# Patient Record
Sex: Female | Born: 1945 | Race: Black or African American | Hispanic: No | Smoking: Former smoker
Health system: Southern US, Community
[De-identification: ages and names within clinical notes are randomized; demographics above are authoritative.]

## PROBLEM LIST (undated history)

## (undated) DIAGNOSIS — K5792 Diverticulitis of intestine, part unspecified, without perforation or abscess without bleeding: Secondary | ICD-10-CM

## (undated) DIAGNOSIS — E119 Type 2 diabetes mellitus without complications: Secondary | ICD-10-CM

## (undated) DIAGNOSIS — R296 Repeated falls: Secondary | ICD-10-CM

## (undated) DIAGNOSIS — I10 Essential (primary) hypertension: Secondary | ICD-10-CM

## (undated) DIAGNOSIS — I509 Heart failure, unspecified: Secondary | ICD-10-CM

## (undated) DIAGNOSIS — I499 Cardiac arrhythmia, unspecified: Secondary | ICD-10-CM

## (undated) HISTORY — PX: JOINT REPLACEMENT: SHX530

## (undated) HISTORY — PX: ABDOMINAL HYSTERECTOMY: SHX81

## (undated) HISTORY — DX: Cardiac arrhythmia, unspecified: I49.9

## (undated) SURGERY — LEFT HEART CATH AND CORONARY ANGIOGRAPHY
Anesthesia: Moderate Sedation | Laterality: Right

---

## 2004-09-04 ENCOUNTER — Ambulatory Visit: Payer: Self-pay | Admitting: Internal Medicine

## 2005-09-30 ENCOUNTER — Other Ambulatory Visit: Payer: Self-pay

## 2005-09-30 ENCOUNTER — Emergency Department: Payer: Self-pay | Admitting: Unknown Physician Specialty

## 2007-01-14 ENCOUNTER — Ambulatory Visit: Payer: Self-pay | Admitting: Internal Medicine

## 2008-09-07 ENCOUNTER — Emergency Department: Payer: Self-pay | Admitting: Emergency Medicine

## 2008-09-08 ENCOUNTER — Ambulatory Visit: Payer: Self-pay | Admitting: General Practice

## 2008-11-02 ENCOUNTER — Ambulatory Visit: Payer: Self-pay

## 2009-04-29 ENCOUNTER — Ambulatory Visit: Payer: Self-pay | Admitting: Internal Medicine

## 2010-01-21 ENCOUNTER — Emergency Department: Payer: Self-pay | Admitting: Unknown Physician Specialty

## 2010-03-29 ENCOUNTER — Ambulatory Visit: Payer: Self-pay

## 2011-01-16 ENCOUNTER — Ambulatory Visit: Payer: Self-pay | Admitting: Surgery

## 2011-02-01 ENCOUNTER — Ambulatory Visit: Payer: Self-pay | Admitting: Surgery

## 2011-02-09 ENCOUNTER — Encounter: Payer: Self-pay | Admitting: Rheumatology

## 2011-02-13 ENCOUNTER — Ambulatory Visit: Payer: Self-pay | Admitting: Rheumatology

## 2011-02-18 ENCOUNTER — Emergency Department: Payer: Self-pay | Admitting: Internal Medicine

## 2011-03-04 ENCOUNTER — Ambulatory Visit: Payer: Self-pay | Admitting: Surgery

## 2011-03-28 ENCOUNTER — Encounter: Payer: Self-pay | Admitting: Orthopedic Surgery

## 2011-04-02 ENCOUNTER — Ambulatory Visit: Payer: Self-pay | Admitting: Internal Medicine

## 2011-04-03 ENCOUNTER — Encounter: Payer: Self-pay | Admitting: Orthopedic Surgery

## 2011-05-04 ENCOUNTER — Encounter: Payer: Self-pay | Admitting: Orthopedic Surgery

## 2011-05-07 ENCOUNTER — Ambulatory Visit: Payer: Self-pay | Admitting: Surgery

## 2011-05-10 ENCOUNTER — Inpatient Hospital Stay: Payer: Self-pay | Admitting: Internal Medicine

## 2011-06-03 ENCOUNTER — Ambulatory Visit: Payer: Self-pay | Admitting: Surgery

## 2012-04-02 ENCOUNTER — Ambulatory Visit: Payer: Self-pay | Admitting: Internal Medicine

## 2012-04-21 ENCOUNTER — Ambulatory Visit: Payer: Self-pay | Admitting: Orthopedic Surgery

## 2012-04-21 LAB — URINALYSIS, COMPLETE
Blood: NEGATIVE
Leukocyte Esterase: NEGATIVE
Nitrite: NEGATIVE
Ph: 5 (ref 4.5–8.0)
RBC,UR: 1 /HPF (ref 0–5)
Specific Gravity: 1.017 (ref 1.003–1.030)
WBC UR: 1 /HPF (ref 0–5)

## 2012-04-21 LAB — BASIC METABOLIC PANEL
Anion Gap: 6 — ABNORMAL LOW (ref 7–16)
BUN: 14 mg/dL (ref 7–18)
Calcium, Total: 8.9 mg/dL (ref 8.5–10.1)
Chloride: 106 mmol/L (ref 98–107)
Co2: 29 mmol/L (ref 21–32)
Creatinine: 1.09 mg/dL (ref 0.60–1.30)
EGFR (African American): >60
EGFR (Non-African Amer.): 53 — ABNORMAL LOW
Glucose: 155 mg/dL — ABNORMAL HIGH (ref 65–99)
Osmolality: 285 (ref 275–301)
Potassium: 4.1 mmol/L (ref 3.5–5.1)

## 2012-04-21 LAB — CBC
HCT: 39.2 % (ref 35.0–47.0)
HGB: 12.5 g/dL (ref 12.0–16.0)
MCH: 28.6 pg (ref 26.0–34.0)
Platelet: 254 10*3/uL (ref 150–440)

## 2012-04-21 LAB — APTT: Activated PTT: 25.7 secs (ref 23.6–35.9)

## 2012-04-21 LAB — PROTIME-INR: Prothrombin Time: 11.9 secs (ref 11.5–14.7)

## 2012-04-21 LAB — SEDIMENTATION RATE: Erythrocyte Sed Rate: 5 mm/hr (ref 0–30)

## 2012-04-21 LAB — MRSA PCR SCREENING

## 2012-04-30 ENCOUNTER — Inpatient Hospital Stay: Payer: Self-pay | Admitting: Orthopedic Surgery

## 2012-05-01 LAB — CBC WITH DIFFERENTIAL/PLATELET
Basophil #: 0 10*3/uL (ref 0.0–0.1)
Basophil %: 0.2 %
Eosinophil #: 0 10*3/uL (ref 0.0–0.7)
HCT: 37 % (ref 35.0–47.0)
HGB: 11.7 g/dL — ABNORMAL LOW (ref 12.0–16.0)
Lymphocyte #: 0.8 10*3/uL — ABNORMAL LOW (ref 1.0–3.6)
MCH: 27.9 pg (ref 26.0–34.0)
MCHC: 31.6 g/dL — ABNORMAL LOW (ref 32.0–36.0)
MCV: 88 fL (ref 80–100)
Monocyte #: 1.8 x10 3/mm — ABNORMAL HIGH (ref 0.2–0.9)
Neutrophil #: 9.3 10*3/uL — ABNORMAL HIGH (ref 1.4–6.5)
Platelet: 203 10*3/uL (ref 150–440)

## 2012-05-01 LAB — BASIC METABOLIC PANEL
Anion Gap: 8 (ref 7–16)
Calcium, Total: 8.5 mg/dL (ref 8.5–10.1)
Co2: 25 mmol/L (ref 21–32)
Osmolality: 278 (ref 275–301)
Sodium: 138 mmol/L (ref 136–145)

## 2012-05-02 LAB — CBC WITH DIFFERENTIAL/PLATELET
Basophil #: 0 10*3/uL (ref 0.0–0.1)
Eosinophil #: 0 10*3/uL (ref 0.0–0.7)
HCT: 35.5 % (ref 35.0–47.0)
HGB: 11.6 g/dL — ABNORMAL LOW (ref 12.0–16.0)
MCH: 28.5 pg (ref 26.0–34.0)
MCHC: 32.6 g/dL (ref 32.0–36.0)
Neutrophil #: 8.7 10*3/uL — ABNORMAL HIGH (ref 1.4–6.5)
Platelet: 194 10*3/uL (ref 150–440)
RBC: 4.05 10*6/uL (ref 3.80–5.20)

## 2012-05-03 LAB — CBC WITH DIFFERENTIAL/PLATELET
Basophil %: 0.3 %
HCT: 33.4 % — ABNORMAL LOW (ref 35.0–47.0)
HGB: 11 g/dL — ABNORMAL LOW (ref 12.0–16.0)
MCHC: 32.9 g/dL (ref 32.0–36.0)
MCV: 87 fL (ref 80–100)
Monocyte %: 10.4 %
Neutrophil #: 7.3 10*3/uL — ABNORMAL HIGH (ref 1.4–6.5)
Neutrophil %: 80 %
RBC: 3.83 10*6/uL (ref 3.80–5.20)
RDW: 13.9 % (ref 11.5–14.5)
WBC: 9.1 10*3/uL (ref 3.6–11.0)

## 2012-05-05 LAB — CREATININE, SERUM: Creatinine: 1.03 mg/dL (ref 0.60–1.30)

## 2012-05-17 ENCOUNTER — Emergency Department: Payer: Self-pay | Admitting: Emergency Medicine

## 2012-05-17 LAB — COMPREHENSIVE METABOLIC PANEL
Albumin: 3 g/dL — ABNORMAL LOW (ref 3.4–5.0)
Anion Gap: 7 (ref 7–16)
BUN: 15 mg/dL (ref 7–18)
Bilirubin,Total: 0.2 mg/dL (ref 0.2–1.0)
Calcium, Total: 9.6 mg/dL (ref 8.5–10.1)
EGFR (African American): >60
EGFR (Non-African Amer.): >60
Glucose: 115 mg/dL — ABNORMAL HIGH (ref 65–99)
Sodium: 139 mmol/L (ref 136–145)

## 2012-05-17 LAB — CBC
HCT: 37.2 % (ref 35.0–47.0)
MCHC: 31.1 g/dL — ABNORMAL LOW (ref 32.0–36.0)
Platelet: 526 10*3/uL — ABNORMAL HIGH (ref 150–440)
WBC: 8 10*3/uL (ref 3.6–11.0)

## 2012-05-17 LAB — APTT: Activated PTT: 30.7 secs (ref 23.6–35.9)

## 2012-05-18 ENCOUNTER — Emergency Department: Payer: Self-pay | Admitting: Emergency Medicine

## 2012-05-23 LAB — CULTURE, BLOOD (SINGLE)

## 2012-05-27 ENCOUNTER — Emergency Department: Payer: Self-pay | Admitting: Emergency Medicine

## 2012-05-27 LAB — CBC
HCT: 39.2 % (ref 35.0–47.0)
MCH: 27.7 pg (ref 26.0–34.0)
MCHC: 31.9 g/dL — ABNORMAL LOW (ref 32.0–36.0)
RBC: 4.51 10*6/uL (ref 3.80–5.20)
RDW: 14.2 % (ref 11.5–14.5)

## 2012-05-27 LAB — BASIC METABOLIC PANEL
Anion Gap: 10 (ref 7–16)
Calcium, Total: 9.4 mg/dL (ref 8.5–10.1)
Chloride: 103 mmol/L (ref 98–107)
Creatinine: 1.33 mg/dL — ABNORMAL HIGH (ref 0.60–1.30)
Glucose: 159 mg/dL — ABNORMAL HIGH (ref 65–99)
Osmolality: 277 (ref 275–301)
Potassium: 4.5 mmol/L (ref 3.5–5.1)

## 2012-05-31 ENCOUNTER — Observation Stay: Payer: Self-pay | Admitting: Internal Medicine

## 2012-05-31 LAB — BASIC METABOLIC PANEL
Anion Gap: 10 (ref 7–16)
Co2: 22 mmol/L (ref 21–32)
Creatinine: 1.04 mg/dL (ref 0.60–1.30)
EGFR (African American): >60
Osmolality: 283 (ref 275–301)
Sodium: 140 mmol/L (ref 136–145)

## 2012-05-31 LAB — CBC
HGB: 11.7 g/dL — ABNORMAL LOW (ref 12.0–16.0)
MCH: 27.3 pg (ref 26.0–34.0)
MCHC: 31.3 g/dL — ABNORMAL LOW (ref 32.0–36.0)
MCV: 87 fL (ref 80–100)
RBC: 4.3 10*6/uL (ref 3.80–5.20)
RDW: 14.1 % (ref 11.5–14.5)

## 2012-09-23 ENCOUNTER — Ambulatory Visit: Payer: Self-pay | Admitting: Orthopedic Surgery

## 2012-09-23 LAB — CBC
HGB: 13.9 g/dL (ref 12.0–16.0)
MCH: 27.4 pg (ref 26.0–34.0)
MCHC: 32.3 g/dL (ref 32.0–36.0)
MCV: 85 fL (ref 80–100)
Platelet: 306 10*3/uL (ref 150–440)
RBC: 5.06 10*6/uL (ref 3.80–5.20)
RDW: 15.1 % — ABNORMAL HIGH (ref 11.5–14.5)

## 2012-09-23 LAB — URINALYSIS, COMPLETE
Blood: NEGATIVE
Glucose,UR: NEGATIVE mg/dL (ref 0–75)
Leukocyte Esterase: NEGATIVE
Nitrite: NEGATIVE
Protein: NEGATIVE
RBC,UR: NONE SEEN /HPF (ref 0–5)
Specific Gravity: 1.003 (ref 1.003–1.030)
Squamous Epithelial: 1
WBC UR: 3 /HPF (ref 0–5)

## 2012-09-23 LAB — BASIC METABOLIC PANEL
Anion Gap: 8 (ref 7–16)
Calcium, Total: 10 mg/dL (ref 8.5–10.1)
Chloride: 101 mmol/L (ref 98–107)
Co2: 33 mmol/L — ABNORMAL HIGH (ref 21–32)
Creatinine: 0.87 mg/dL (ref 0.60–1.30)
EGFR (Non-African Amer.): >60
Glucose: 83 mg/dL (ref 65–99)
Osmolality: 283 (ref 275–301)
Sodium: 142 mmol/L (ref 136–145)

## 2012-09-23 LAB — MRSA PCR SCREENING

## 2012-09-23 LAB — PROTIME-INR
INR: 0.8
Prothrombin Time: 11.8 secs (ref 11.5–14.7)

## 2012-09-30 ENCOUNTER — Inpatient Hospital Stay: Payer: Self-pay | Admitting: Orthopedic Surgery

## 2012-10-01 LAB — CBC WITH DIFFERENTIAL/PLATELET
Basophil #: 0 10*3/uL (ref 0.0–0.1)
Basophil %: 0.3 %
Eosinophil %: 0.1 %
HCT: 32.6 % — ABNORMAL LOW (ref 35.0–47.0)
HGB: 10.6 g/dL — ABNORMAL LOW (ref 12.0–16.0)
Lymphocyte #: 1.6 10*3/uL (ref 1.0–3.6)
Lymphocyte %: 13.3 %
MCV: 85 fL (ref 80–100)
Monocyte %: 13.4 %
Neutrophil #: 8.6 10*3/uL — ABNORMAL HIGH (ref 1.4–6.5)
RBC: 3.84 10*6/uL (ref 3.80–5.20)
RDW: 14.3 % (ref 11.5–14.5)
WBC: 11.8 10*3/uL — ABNORMAL HIGH (ref 3.6–11.0)

## 2012-10-01 LAB — BASIC METABOLIC PANEL
Anion Gap: 9 (ref 7–16)
BUN: 15 mg/dL (ref 7–18)
Creatinine: 0.99 mg/dL (ref 0.60–1.30)
EGFR (African American): >60
Glucose: 174 mg/dL — ABNORMAL HIGH (ref 65–99)
Potassium: 3.9 mmol/L (ref 3.5–5.1)
Sodium: 141 mmol/L (ref 136–145)

## 2012-10-02 LAB — CBC WITH DIFFERENTIAL/PLATELET
Basophil #: 0 10*3/uL (ref 0.0–0.1)
Basophil %: 0.3 %
Eosinophil %: 0.2 %
HCT: 30.2 % — ABNORMAL LOW (ref 35.0–47.0)
HGB: 10.1 g/dL — ABNORMAL LOW (ref 12.0–16.0)
Lymphocyte #: 0.9 10*3/uL — ABNORMAL LOW (ref 1.0–3.6)
Lymphocyte %: 8.8 %
MCH: 28.1 pg (ref 26.0–34.0)
Monocyte #: 1.3 x10 3/mm — ABNORMAL HIGH (ref 0.2–0.9)
Monocyte %: 12.9 %
Neutrophil %: 77.8 %
Platelet: 196 10*3/uL (ref 150–440)
RBC: 3.61 10*6/uL — ABNORMAL LOW (ref 3.80–5.20)
WBC: 10 10*3/uL (ref 3.6–11.0)

## 2012-10-03 LAB — CBC WITH DIFFERENTIAL/PLATELET
Basophil #: 0 10*3/uL (ref 0.0–0.1)
Basophil %: 0.4 %
Eosinophil #: 0.1 10*3/uL (ref 0.0–0.7)
HCT: 28.5 % — ABNORMAL LOW (ref 35.0–47.0)
HGB: 9.4 g/dL — ABNORMAL LOW (ref 12.0–16.0)
Lymphocyte %: 15.5 %
MCV: 85 fL (ref 80–100)
Monocyte #: 1.1 x10 3/mm — ABNORMAL HIGH (ref 0.2–0.9)
Neutrophil #: 5.9 10*3/uL (ref 1.4–6.5)
RBC: 3.34 10*6/uL — ABNORMAL LOW (ref 3.80–5.20)
WBC: 8.6 10*3/uL (ref 3.6–11.0)

## 2012-10-03 LAB — BASIC METABOLIC PANEL
Calcium, Total: 8.8 mg/dL (ref 8.5–10.1)
Chloride: 102 mmol/L (ref 98–107)
Creatinine: 0.85 mg/dL (ref 0.60–1.30)
EGFR (African American): >60
EGFR (Non-African Amer.): >60
Glucose: 130 mg/dL — ABNORMAL HIGH (ref 65–99)
Osmolality: 282 (ref 275–301)
Potassium: 4.1 mmol/L (ref 3.5–5.1)
Sodium: 140 mmol/L (ref 136–145)

## 2013-04-06 ENCOUNTER — Ambulatory Visit: Payer: Self-pay | Admitting: Internal Medicine

## 2013-04-18 ENCOUNTER — Inpatient Hospital Stay: Payer: Self-pay | Admitting: Internal Medicine

## 2013-04-18 LAB — CK TOTAL AND CKMB (NOT AT ARMC)
CK, Total: 112 U/L (ref 21–215)
CK, Total: 93 U/L (ref 21–215)
CK, Total: 81 U/L (ref 21–215)
CK-MB: 0.8 ng/mL (ref 0.5–3.6)
CK-MB: 0.7 ng/mL (ref 0.5–3.6)

## 2013-04-18 LAB — BASIC METABOLIC PANEL
Anion Gap: 7 (ref 7–16)
BUN: 15 mg/dL (ref 7–18)
Co2: 28 mmol/L (ref 21–32)
Creatinine: 0.91 mg/dL (ref 0.60–1.30)
EGFR (African American): >60
EGFR (Non-African Amer.): >60
Glucose: 99 mg/dL (ref 65–99)
Osmolality: 288 (ref 275–301)

## 2013-04-18 LAB — CBC
HCT: 35.7 % (ref 35.0–47.0)
MCH: 26.5 pg (ref 26.0–34.0)
MCHC: 32.2 g/dL (ref 32.0–36.0)
MCV: 82 fL (ref 80–100)
Platelet: 247 10*3/uL (ref 150–440)
RBC: 4.34 10*6/uL (ref 3.80–5.20)
RDW: 15.3 % — ABNORMAL HIGH (ref 11.5–14.5)
WBC: 7.8 10*3/uL (ref 3.6–11.0)

## 2013-04-18 LAB — TROPONIN I
Troponin-I: 0.09 ng/mL — ABNORMAL HIGH
Troponin-I: 0.09 ng/mL — ABNORMAL HIGH
Troponin-I: 0.07 ng/mL — ABNORMAL HIGH
Troponin-I: 0.05 ng/mL

## 2013-04-19 LAB — CBC WITH DIFFERENTIAL/PLATELET
Basophil #: 0 10*3/uL (ref 0.0–0.1)
Basophil %: 0.7 %
Eosinophil #: 0.1 10*3/uL (ref 0.0–0.7)
Eosinophil %: 1 %
HCT: 35.8 % (ref 35.0–47.0)
HGB: 11.8 g/dL — ABNORMAL LOW (ref 12.0–16.0)
Lymphocyte #: 1.3 10*3/uL (ref 1.0–3.6)
MCHC: 33.1 g/dL (ref 32.0–36.0)
Monocyte #: 0.6 x10 3/mm (ref 0.2–0.9)
Neutrophil #: 3.3 10*3/uL (ref 1.4–6.5)
Neutrophil %: 61.5 %
RBC: 4.45 10*6/uL (ref 3.80–5.20)
RDW: 15 % — ABNORMAL HIGH (ref 11.5–14.5)

## 2013-04-19 LAB — BASIC METABOLIC PANEL
Anion Gap: 9 (ref 7–16)
BUN: 17 mg/dL (ref 7–18)
Calcium, Total: 9.6 mg/dL (ref 8.5–10.1)
Chloride: 99 mmol/L (ref 98–107)
Creatinine: 0.99 mg/dL (ref 0.60–1.30)
EGFR (Non-African Amer.): 59 — ABNORMAL LOW
Glucose: 120 mg/dL — ABNORMAL HIGH (ref 65–99)
Osmolality: 277 (ref 275–301)
Potassium: 3.4 mmol/L — ABNORMAL LOW (ref 3.5–5.1)
Sodium: 137 mmol/L (ref 136–145)

## 2013-04-19 LAB — TSH: Thyroid Stimulating Horm: 1.15 u[IU]/mL

## 2013-04-19 LAB — LIPID PANEL
Triglycerides: 69 mg/dL (ref 0–200)
VLDL Cholesterol, Calc: 14 mg/dL (ref 5–40)

## 2013-04-19 LAB — HEMOGLOBIN A1C: Hemoglobin A1C: 6.7 % — ABNORMAL HIGH (ref 4.2–6.3)

## 2013-04-20 LAB — BASIC METABOLIC PANEL
Anion Gap: 5 — ABNORMAL LOW (ref 7–16)
BUN: 21 mg/dL — ABNORMAL HIGH (ref 7–18)
Calcium, Total: 9.8 mg/dL (ref 8.5–10.1)
Chloride: 99 mmol/L (ref 98–107)
Co2: 32 mmol/L (ref 21–32)
Creatinine: 0.98 mg/dL (ref 0.60–1.30)
Glucose: 120 mg/dL — ABNORMAL HIGH (ref 65–99)
Osmolality: 276 (ref 275–301)
Potassium: 3.4 mmol/L — ABNORMAL LOW (ref 3.5–5.1)
Sodium: 136 mmol/L (ref 136–145)

## 2013-04-20 LAB — MAGNESIUM: Magnesium: 2 mg/dL

## 2013-04-21 LAB — BASIC METABOLIC PANEL
BUN: 21 mg/dL — ABNORMAL HIGH (ref 7–18)
Chloride: 97 mmol/L — ABNORMAL LOW (ref 98–107)
Co2: 32 mmol/L (ref 21–32)
Creatinine: 0.97 mg/dL (ref 0.60–1.30)
EGFR (African American): >60
EGFR (Non-African Amer.): >60
Glucose: 125 mg/dL — ABNORMAL HIGH (ref 65–99)

## 2013-04-22 LAB — BASIC METABOLIC PANEL
Anion Gap: 5 — ABNORMAL LOW (ref 7–16)
Calcium, Total: 9.3 mg/dL (ref 8.5–10.1)
Chloride: 102 mmol/L (ref 98–107)
Co2: 31 mmol/L (ref 21–32)
EGFR (African American): >60
Glucose: 118 mg/dL — ABNORMAL HIGH (ref 65–99)
Sodium: 138 mmol/L (ref 136–145)

## 2013-05-05 ENCOUNTER — Emergency Department: Payer: Self-pay | Admitting: Emergency Medicine

## 2013-05-05 LAB — CBC
HGB: 11.2 g/dL — ABNORMAL LOW (ref 12.0–16.0)
MCHC: 32.6 g/dL (ref 32.0–36.0)
MCV: 81 fL (ref 80–100)
RDW: 14.4 % (ref 11.5–14.5)
WBC: 5.1 10*3/uL (ref 3.6–11.0)

## 2013-05-05 LAB — BASIC METABOLIC PANEL
Anion Gap: 8 (ref 7–16)
BUN: 16 mg/dL (ref 7–18)
Calcium, Total: 8.9 mg/dL (ref 8.5–10.1)
Co2: 24 mmol/L (ref 21–32)
EGFR (African American): >60
EGFR (Non-African Amer.): >60
Glucose: 105 mg/dL — ABNORMAL HIGH (ref 65–99)
Osmolality: 281 (ref 275–301)
Potassium: 3.6 mmol/L (ref 3.5–5.1)
Sodium: 140 mmol/L (ref 136–145)

## 2013-05-05 LAB — CK TOTAL AND CKMB (NOT AT ARMC)
CK, Total: 59 U/L (ref 21–215)
CK-MB: <0.5 ng/mL — ABNORMAL LOW (ref 0.5–3.6)

## 2013-05-05 LAB — PRO B NATRIURETIC PEPTIDE: B-Type Natriuretic Peptide: 207 pg/mL — ABNORMAL HIGH (ref 0–125)

## 2013-05-05 LAB — TROPONIN I: Troponin-I: 0.06 ng/mL — ABNORMAL HIGH

## 2013-05-11 ENCOUNTER — Emergency Department: Payer: Self-pay | Admitting: Emergency Medicine

## 2013-05-11 LAB — CBC
HCT: 36.5 % (ref 35.0–47.0)
MCHC: 32.3 g/dL (ref 32.0–36.0)
Platelet: 258 10*3/uL (ref 150–440)
RDW: 14.9 % — ABNORMAL HIGH (ref 11.5–14.5)
WBC: 6.7 10*3/uL (ref 3.6–11.0)

## 2013-05-11 LAB — COMPREHENSIVE METABOLIC PANEL
Albumin: 3.5 g/dL (ref 3.4–5.0)
Alkaline Phosphatase: 69 U/L (ref 50–136)
BUN: 14 mg/dL (ref 7–18)
Bilirubin,Total: 0.3 mg/dL (ref 0.2–1.0)
Calcium, Total: 8.9 mg/dL (ref 8.5–10.1)
Chloride: 110 mmol/L — ABNORMAL HIGH (ref 98–107)
Creatinine: 0.98 mg/dL (ref 0.60–1.30)
EGFR (African American): >60
EGFR (Non-African Amer.): 60 — ABNORMAL LOW
SGOT(AST): 16 U/L (ref 15–37)
SGPT (ALT): 16 U/L (ref 12–78)
Sodium: 143 mmol/L (ref 136–145)
Total Protein: 6.8 g/dL (ref 6.4–8.2)

## 2013-05-11 LAB — URINALYSIS, COMPLETE
Bilirubin,UR: NEGATIVE
Blood: NEGATIVE
Glucose,UR: NEGATIVE mg/dL (ref 0–75)
Ketone: NEGATIVE
Nitrite: NEGATIVE
Ph: 6 (ref 4.5–8.0)
Protein: NEGATIVE
RBC,UR: 1 /HPF (ref 0–5)
Squamous Epithelial: 9
WBC UR: 4 /HPF (ref 0–5)

## 2013-05-11 LAB — TROPONIN I
Troponin-I: 0.09 ng/mL — ABNORMAL HIGH
Troponin-I: 0.08 ng/mL — ABNORMAL HIGH

## 2013-11-27 ENCOUNTER — Ambulatory Visit: Payer: Self-pay | Admitting: Internal Medicine

## 2014-04-28 ENCOUNTER — Ambulatory Visit: Payer: Self-pay | Admitting: Internal Medicine

## 2015-01-21 ENCOUNTER — Ambulatory Visit: Payer: Self-pay | Admitting: Gastroenterology

## 2015-03-22 NOTE — Discharge Summary (Signed)
PATIENT NAME:  Virginia, Barnes MR#:  [id number] DATE OF BIRTH:  02/12/1946  DATE OF ADMISSION:  09/30/2012 DATE OF DISCHARGE:  10/03/2012  ADMITTING DIAGNOSIS: Status post right total knee arthroplasty.   DISCHARGE DIAGNOSIS: Status post right total knee arthroplasty.  HISTORY: Virginia Barnes is a 69 year old female who has had long-standing right knee pain with tricompartmental degenerative changes seen on x-ray. Virginia Barnes successfully underwent a left total knee arthroplasty earlier this year. Virginia Barnes is here for total knee arthroplasty on the right side.   PAST MEDICAL/SURGICAL HISTORY:  1. Type 2 diabetes.  2. Depression.  3. Hypertension.  4. Gastroesophageal reflux disease.  5. Status post total hysterectomy.  6. Status post left total knee arthroplasty. 7. Status post open reduction and internal fixation right wrist.   ALLERGIES: Lisinopril, ACE inhibitors and peanuts.   HOME MEDICATIONS:  1. Tribenzor 40 mg/5 mg/25 mg p.o. daily. 2. Ranitidine 150 mg b.i.d.  3. Omeprazole 40 mg daily. 4. Metoprolol 50 mg b.i.d.  5. Metformin 500 mg b.i.d. 6. Crestor 10 mg daily. 7. Aspirin 81 mg daily.   HOSPITAL COURSE: The patient was admitted following a successful, uncomplicated right total knee arthroplasty on 09/30/2012. Postoperatively, the patient was admitted to the Orthopedic floor. Virginia Barnes was kept on postoperative antibiotics for 24 hours. A Foley catheter was in place and was removed on postoperative day two. Physical and Occupational Therapy consults were ordered for postoperative day one, and they continued to follow her throughout her hospital stay. The patient had daily labs drawn, including a basic metabolic profile and a complete blood count. Her hemoglobin and hematocrit remained stable throughout her hospitalization. The patient's Hemovac drain was removed after 24 hours as well. The patient's hemoglobin and hematocrit were stable, and Virginia Barnes never required any blood transfusion during her  hospitalization. On postoperative day two, the patient did have an episode of orthostatic hypotension when Virginia Barnes was up with Physical Therapy. These symptoms resolved in short order, and Virginia Barnes had no further symptoms. The patient's vital signs remained stable from the remainder of her hospitalization. By postoperative day three, Virginia Barnes had had a bowel movement. Her pain was well controlled on  Norco, and the patient has shown excellent clinical improvement and is ready for discharge.   DISCHARGE INSTRUCTIONS:  1. The patient will be discharged to skilled nursing.  2. Virginia Barnes will keep her right lower extremity elevated on pillows when in bed.  3. Virginia Barnes should continue wearing knee-high TED hose on both legs to help reduce edema.  4. Virginia Barnes is to have daily dressing changes until the incision is completely dry.  5. The patient has diabetes and should be on a diabetic diet and should have fingerstick checks with meals.  6. Virginia Barnes should be on an insulin sliding scale while at rehab. Virginia Barnes was on a sliding scale while  an inpatient here.  7. The patient should have Lovenox 40 mg subcutaneous daily for deep vein thrombosis prophylaxis.  8. Her staples will be removed in approximately 10 days in the surgeon's office.  9. Virginia Barnes should continue physical and occupational therapy.  10. Virginia Barnes will be partial weight-bearing on the right lower extremity. Virginia Barnes may bear full weight on the left side.  11. The patient will continue using her Polar Care unit.  12. The patient will be on Vicodin 5/325 mg, 1 to 2 tabs every 4 hours p.r.n. for pain. Virginia Barnes may restart her home medications.  13. Virginia Barnes will follow up with me in two weeks.  ____________________________  Kathreen DevoidKevin L. Jenevie Casstevens, MD klk:cbb D: 10/03/2012 15:27:44 ET T: 10/03/2012 15:50:33 ET JOB#: [id number]  cc: Kathreen DevoidKevin L. Norlan Rann, MD, <Dictator> Kathreen DevoidKEVIN L Frida Wahlstrom MD ELECTRONICALLY SIGNED 10/08/2012 8:47

## 2015-03-22 NOTE — Op Note (Signed)
PATIENT NAME:  [place]ia Barnes, Virginia Barnes MR#:  [id number] DATE OF BIRTH:  1946/06/19  DATE OF PROCEDURE:  09/30/2012  PREOPERATIVE DIAGNOSIS: Right knee osteoarthritis.   POSTOPERATIVE DIAGNOSIS: Right knee osteoarthritis.  PROCEDURE: Right total knee arthroplasty.   SURGEON: Juanell Fairly, MD  ASSISTANT: Deeann Saint, MD  ANESTHESIA: Spinal.   TOURNIQUET TIME: 120 minutes.   COMPLICATIONS: None.   SPECIMENS: None.   IMPLANTS:  DePuy PFC Sigma Size 3 posterior stabilized femoral component, Size 3 rotating platform tibial component, 10mm polyethylene insert, 35mm polyethylene patella component  INDICATIONS FOR PROCEDURE: The patient is a 69 year old female with persistent right knee pain and swelling. The patient had successfully undergone a left total knee arthroplasty earlier this year. The patient wished to proceed with a right total knee arthroplasty given the success she has had on the left side.   The patient was aware of the risks and benefits of surgery given the fact that she had been through it earlier this year on the left side. I had seen her in the office prior to her surgery to again review the risks and benefits of surgery. She understands the risks include infection, bleeding requiring blood transfusion, nerve or blood vessel injury leading to permanent numbness or weakness in the right lower extremity, knee stiffness, fracture, dislocation, persistent pain or instability of the right knee, failure or loosening of the components, and the need for further surgery. She understands the medical risks include, but are not limited to, deep vein thrombosis and pulmonary embolism, myocardial infarction, stroke, pneumonia, respiratory failure, and death. The patient signed the consent form in my office.   PROCEDURE NOTE: The patient was brought to the Operating Room where she underwent a spinal anesthetic. She was then prepped and draped in a sterile fashion. A timeout was performed to verify  the patient's name, date of birth, medical record number, correct site of surgery, and correct procedure to be performed. It was also used to verify the patient had received antibiotics and that all appropriate instruments, implants, and radiographic studies were available in the room. Once all in attendance were in agreement, the case began.   A proposed incision was drawn out based on bony landmarks. A #10 blade was used to make a longitudinal incision centered over the anterior aspect of the right knee. Full-thickness flaps were then developed using a deep #10 blade. A medial arthrotomy was then performed, and then the right patella was everted. The knee was flexed to approximately 100 degrees. Hoffa's fat pad, along with the medial and lateral meniscus and the cruciate ligaments were then removed. A rongeur was used to remove osteophytes. A drill was used to enter the femoral canal. This allowed for the intramedullary rod as part of the distal femoral cutting guide to be inserted into the femur. Given the fact that the patient had a flexion contracture, approximately 12 mm off the distal femur was removed during the distal femoral osteotomy. The attention was then turned to the tibia. The external tibial alignment guide was attached to the anterior portion of the tibia. The posterior slope was dialed into the tibial alignment guide. Approximately 8 to 10 mm off the high side was deemed necessary. A cutting block was then pinned into position. The external tower was then removed. A tibial drop rod was then used to ensure adequate alignment with the midportion of the ankle and the second ray of the foot. An oscillating saw was then used to perform the tibial osteotomy. The extension  gap was then checked and found to be able to fit a 10 mm spacing block. The medial side was slightly tighter and a soft tissue release was performed using a gentle peal of the medial collateral ligament. The knee was then again  flexed up to 100 degrees. The femoral sizing guide was then placed over the distal femur. The femur was sized to be a size 3 which was the same size as it was on her left knee. This 3 degrees of external rotation were then pinned into position. The femoral sizing guide was then removed, and the size 3 cutting block was malleted into position using the pre-set pin holes in the distal femur. A "lobster claw" was placed through the anterior cutting slot to ensure that no notching would occur of the anterior femur. Then the anterior and posterior cuts were made of the femur along with the chamfer cuts. The size 3 femoral cutting guide was then removed. The box cutting guide was then placed for a size three on the distal femur. This allow for cutting of a box for a posterior stabilized prosthesis. Once the box had been cut, the cutting guide was removed. The trial size 3 femur was then placed and found to fit very well. This was then removed, and the attention was turned back to the tibia.   A size 3 tibial tray was found to have the best coverage of the proximal tibia. This again was pinned into position. The keel drill tower was then placed on top of the size 3 tibial tray. The keel was then drilled and the flanges were punched into position. The pins were then removed along with the tower. The size 3 femoral trial was then replaced onto the femur and a size 10 mm trial plastic insert was placed. The knee was brought through a full range of motion including full flexion to 130 degrees and full extension. The patient had excellent stability.   The attention was then turned to cutting the patella. The patella was sized to be 35 mm, which was equivalent with her left side. The appropriate depth for a 35 mm patellar button was then osteotomized from the undersurface of the patella. Three peg holes were drilled using the patella drill guide. The trial button was then placed on the undersurface of the patella, and the  patella was put back into position. The knee again was taken through a full range of motion, and there was no lateral subluxation or dislocation of the patella. All trial components were then removed. The knee joint was copiously irrigated. Methylmethacrylate was then applied to the proximal tibia, and the actual size 3 tibial rotating platform tray was then malleted into position and excess methylmethacrylate was removed using curettes. Methylmethacrylate was then placed over the distal femur, and the size 3 right posterior stabilized cemented component was malleted into position. A 10 mm polyethylene insert was then placed, and the knee was brought into full extension. A size 35 mm patellar button was then also placed over the right patella using methylmethacrylate. Excess methylmethacrylate was removed from all surfaces including the femur, tibia and patella. After the methylmethacrylate was allowed to cure for approximately 60 minutes, the knee joint was copiously irrigated. Two drain tubes were brought out the superior lateral right knee and hooked to an Autovac. The medial arthrotomy was closed with #1 Ethibond in an interrupted fashion. The subcutaneous tissues were closed in two layers with 0 Vicryl and then 2-0 Vicryl. The skin  was approximated with skin staples. A dry sterile dressing was applied. The patient had a Polar Care sleeve along with a TENS unit and a knee immobilizer applied over the right knee as well. She was then transferred to a hospital bed and brought to the PAC-U in stable condition.   I spoke to her son by phone to let him know that the case had gone without complication and the patient was stable in the recovery room.  ____________________________ Kathreen DevoidKevin L. Jody Silas, MD klk:cbb D: 10/05/2012 22:59:00 ET T: 10/06/2012 06:27:59 ET JOB#: [id number]  cc: Kathreen DevoidKevin L. Shamanda Len, MD, <Dictator> Kathreen DevoidKEVIN L Jerald Hennington MD ELECTRONICALLY SIGNED 10/08/2012 8:47

## 2015-03-25 NOTE — H&P (Signed)
Barnes NAME:  Virginia Barnes, Virginia Barnes MR#:  [id number] DATE OF BIRTH:  04/16/1946  DATE OF ADMISSION:  04/18/2013  PRIMARY CARE PHYSICIAN:  Dr. Yves DillNeelam Khan  REFERRING PHYSICIAN:  Dr. Lavella LemonsManly.  CHIEF COMPLAINT:  Shortness of breath.   HISTORY OF PRESENT ILLNESS:  Virginia Barnes is a 69 year old pleasant African American female with past medical history of hypertension, hyperlipidemia, diabetes mellitus, on oral medication, presented to [place] [facility] with sudden onset of shortness of breath started since last night. [place] Barnes woke up in [place] middle of [place] night because of shortness of breath. [place] Barnes states she was doing well before going to sleep. Did not have any chest pain. [place] Barnes states could not catch her breath, was diffusely wheezing at that time. Considering this, EMS was called and was brought to [place] [facility]. [place] Barnes was diffusely wheezing at Virginia time of [place] presentation. Was given 1 dose of Lasix IV with significant improvement after [place] diuresis. As mentioned above, [place] Barnes did not have any chest pain, palpitations, nausea, vomiting. [place] Barnes states only walks around [place] house, usually does not get any chest pain or shortness of breath. Denies having any lower extremity swelling. Usually denies having any PND, orthopnea.  Workup in [place] [facility]:  [place]ia Barnes had mild elevation of [place] troponin of 0.09. Chest x-ray is consistent with pulmonary edema.   PAST MEDICAL HISTORY: 1.  Hypertension.  2.  Type 2 diabetes mellitus on oral medication.  3.  Hyperlipidemia.  4.  Depression.  5.  History of pharyngeal edema from ACE inhibitors.   PAST SURGICAL HISTORY: 1.  Bilateral knee replacements.  2.  Hysterectomy.   ALLERGIES: 1.  PEANUTS.  2.  LISINOPRIL.  3.  ACE INHIBITORS.   HOME MEDICATIONS: 1.  Omeprazole 40 mg once a day.  2.  Metoprolol 50 mg 2 times a day.  3.  Metformin 500 mg 3 times a day.  4.  Hydrochlorothiazide 50 mg 2 times a day.   5.  Crestor 10 mg once a day.  6.  Aspirin 81 mg daily.  7.  Amlodipine 5 mg oral daily.   SOCIAL HISTORY: No history of smoking, drinking alcohol or using illicit drugs. Lives by herself. Former smoker. Quit smoking in 2009.  FAMILY HISTORY:  One of her brothers has diabetes mellitus. Mother lived up to 100 years died of natural causes. [place] father died at [place] age of 70 from pacemaker malfunction.   REVIEW OF SYSTEMS: CONSTITUTIONAL:  Generalized weakness. No weight loss.  EYES:  No change in vision.  EARS:  No change in hearing. No sore throat.  RESPIRATORY:  No cough, has shortness of breath.  CARDIOVASCULAR:  No chest pain, palpitations. No pedal edema.  GASTROINTESTINAL:  Good appetite, regular bowel movements. No abdominal pain.  GENITOURINARY:  No dysuria or hematuria.  SKIN:  No rash or lesions.  MUSCULOSKELETAL:  Has pain in both knees with walking.  ENDOCRINE:  No polyuria or polydipsia. NEUROLOGIC:  No weakness or numbness in any part of [place] body.   PHYSICAL EXAMINATION: GENERAL: This is a well-built, well-nourished, age-appropriate female lying down in [place] bed, not in distress.  VITAL SIGNS: Temperature 98.8, pulse 83, blood pressure 151/67, respiratory rate of 20, oxygen saturations 100% on room air.  HEENT: Head normocephalic, atraumatic. There is no scleral icterus. Conjunctivae normal. Pupils equal and react to light. Mucous membranes moist.  NECK:  Supple. No lymphadenopathy. No JVD. No carotid bruit. No thyromegaly.  CHEST:  Has no focal tenderness. Coarse breath sounds bilaterally. HEART:  S1, S2, regular. No murmurs are heard.  EXTREMITIES:  No lower extremity swelling, Pulses 2+ on dorsalis pedis and posterior tibialis. ABDOMEN:  Bowel sounds present. Soft, nontender, nondistended.  MUSCULOSKELETAL:  Good range of motion in all [place] extremities.  SKIN: No rash or lesions. NEUROLOGIC:  [place]ia Barnes is alert, oriented to place, person and time. Cranial nerves II to  XII intact. Motor 5/5 in upper and lower extremities. No sensory deficits.  LYMPHATIC:  No axillary or inguinal lymphadenopathy.   LABORATORY DATA: Chest x-ray, one view portable, shows findings consistent with CHF, mild pulmonary interstitial edema. There may be subsegmental atelectasis developing infiltrate in [place] right infrahilar region.   Complete metabolic panel:  BMP is completely within normal limits. BNP 1400, troponin 0.09. CBC:  WBC of 7.8, hemoglobin 11.5, platelet count of 247.  EKG, 12-lead:  Atrial fibrillation is a new onset.   ASSESSMENT AND PLAN:  [place]ia Barnes is a 69 year old female with history of diabetes mellitus, hypertension, poor functional status comes to [place] [facility] with sudden onset of shortness of breath.  1.  Congestive heart failure, new onset.  [place] precipitating factor, [place] possibility of acute coronary syndrome as [place] Barnes has mild elevation of [place] troponin. Will admit [place] Barnes to [place] monitored bed, continue with [place] Lasix IV.  Will obtain echocardiogram. Will obtain cardiology consult considering [place] Barnes's risk factors.  2.  Hypertension, poorly controlled.  Continue with home medications and follow up. Will continue [place] Lasix which should improve [place] blood pressure.  3.  Mild elevation of [place] troponins.  Will obtain echocardiogram. Continue to cycle cardiac enzymes x 3. Will obtain echocardiogram. Will obtain cardiology consult.  4.  Diabetes mellitus.  Will hold oral medication.  5.  Atrial fibrillation, new onset. Will continue to follow. Will obtain echocardiogram and also check [place] TSH level.  6.  Keep [place] Barnes on deep vein thrombosis prophylaxis with Lovenox. 7.  Atrial fibrillation, new onset.  Virginia Barnes meets a CHADS score of 2 to 3.  [place]ia Barnes is a candidate for anticoagulation.  Will need further discussions with [place] Barnes regarding [place] type of anticoagulation.  TIME SPENT:  50 minutes.     ____________________________ Susa Griffins, MD pv:ce D: 04/18/2013 08:14:09 ET T: 04/18/2013 10:12:11 ET JOB#: [id number]  cc: Susa Griffins, MD, <Dictator> Margaretann Loveless, MD  Clerance Lav Keni Wafer MD ELECTRONICALLY SIGNED 04/22/2013 0:11

## 2015-03-25 NOTE — Consult Note (Signed)
Consult dictated and added lasix 40 bid and lisinopril HCTZ 20/12.5, will review echo. Mildly elevated troponin due to demand ischaemia and denies chest pains.  Electronic Signatures: Radene KneeKhan, Damere Brandenburg Ali (MD)  (Signed on 17-May-14 11:10)  Authored  Last Updated: 17-May-14 11:10 by Radene KneeKhan, Evens Meno Ali (MD)

## 2015-03-25 NOTE — Discharge Summary (Signed)
PATIENT NAME:  [place]ia Barnes, Virginia Barnes MR#:  [id number] DATE OF BIRTH:  June 10, 1946  DATE OF ADMISSION:  04/18/2013 DATE OF DISCHARGE:  04/22/2013  DISCHARGE DIAGNOSES:  1.  Diastolic congestive heart failure.  2.  Malignant hypertension.  3.  Atrial fibrillation. 4.  Diabetes mellitus.  5.  Acute kidney injury.  6.  Hyponatremia.   CONSULTATIONS:  Cardiology.   PROCEDURES:  A 2D echocardiogram which revealed diastolic dysfunction.   DISCHARGE MEDICATIONS:  Crestor 10 mg at bedtime, omeprazole 40 mg q.a.m., metformin 500 mg b.i.d., aspirin 81 mg daily, verapamil 120 mg extended release once daily.   HOSPITAL COURSE:  This lady was admitted through the Emergency Room when she presented with acute dyspnea, a clinical impression of acute congestive heart failure was made. Please refer to history and physical for full details. She was initially placed on intravenous diuresis and ruled out for acute coronary syndrome. The patient underwent 2D echocardiogram which revealed a marked diastolic dysfunction as the etiology of her congestive heart failure. Verapamil was added to improve her cardiac relaxation. Her symptoms improved. However, her hospital stay was complicated by acute kidney injury due to presumed over diuresis. The patient'[name] diuretics were held. She also received normal saline infusion, which resulted in correction of her azotemia, hyponatremia and normalization of renal function. The patient'[name] blood pressure was controlled with addition of verapamil. She did become symptomatically dizzy and her medication was adjusted to the point where Calan or verapamil extended release was adequate to control her blood pressure. The patient is discharged to home in satisfactory condition.   DIET:  Low sodium ADA diet.   ACTIVITY: As tolerated.  DISCHARGE INSTRUCTIONS:  Follow up with cardiologist, [name], in 1 to 2 weeks.   DISCHARGE PROCESS TIME SPENT:  35  minutes.  ____________________________ Silas FloodSheikh A. Ellsworth Lennoxejan-Sie, MD sat:jm D: 05/01/2013 13:39:00 ET T: 05/01/2013 15:24:06 ET JOB#: [id number]  cc: Sheikh A. Ellsworth Lennoxejan-Sie, MD, <Dictator> Charlesetta GaribaldiSHEIKH A TEJAN-SIE MD ELECTRONICALLY SIGNED 05/02/2013 11:42

## 2015-03-25 NOTE — Consult Note (Signed)
PATIENT NAME:  [place] Barnes, Virginia Barnes MR#:  [id number] DATE OF BIRTH:  [birth year]  DATE OF CONSULTATION:  04/18/2013  REFERRING PHYSICIAN:   CONSULTING PHYSICIAN:  Laurier NancyShaukat A. Khan, MD  HISTORY OF PRESENT ILLNESS:  This is a 69 year old African American female with a past medical history of diabetes, hypertension, who came into the hospital with swelling of the legs and shortness of breath. The patient states that for the past couple of days, she noticed swelling of the legs, which was progressively getting worse, then all of a sudden she had a sudden onset of severe shortness of breath, no chest pain, and she called 911 and was brought to the hospital. The shortness of breath woke her up from sleep. She had mildly elevated troponin of 0.09. She denies, however, any chest pain. Her chest x-ray showed bilateral congestion and a questionable infiltrate.    PAST MEDICAL HISTORY:  She was recently admitted with bilateral pneumonia thus that is why she was concerned and coming into the hospital. She had a negative stress test recently in the office. She has a past medical history of hypertension but no CHF in the past.   SOCIAL HISTORY:  No history of EtOH abuse or smoking.   FAMILY HISTORY:  Positive for coronary artery disease.   PHYSICAL EXAMINATION:  GENERAL:  She is alert, oriented x 3, in no acute distress.  VITAL SIGNS:  Blood pressure is 150/72, respirations 20, pulse 92, temperature 97.7, pulse ox is 99.  HEENT:  No JVD.  LUNGS:  Good air entry. No rales. No wheezing.  HEART:  Regular rate and rhythm. Normal S1, S2. No audible murmur at this time.  ABDOMEN:  Soft, nontender, positive bowel sounds.  EXTREMITIES:  No pedal edema.  NEUROLOGIC:  She appears to be intact.   LABS AND STUDIES:  EKG shows normal sinus rhythm, 83 beats per minute, nonspecific ST-T changes with right ventricular hypertrophy. Chest x-ray shows interstitial edema and right interhilar region infiltrate. Her troponin was 0.09.  The BNP was 1410, BUN was 15. Creatinine 0.9. The second set troponin was also 0.09   ASSESSMENT AND PLAN:  The patient appears to have had congestive heart failure, responded very well to Lasix, right now feeling much better. Her blood pressure needs to be better controlled. She is not on any ACE inhibitor. We will start the patient on lisinopril 20 mg once a day. We will review her echocardiogram. The mildly elevated troponin are most likely due to demand ischemia. No need for cardiac catheterization at this time. We will most likely, once she starts feeling better, discharge home with followup Lexiscan Myoview or a stress Myoview in the office. Thank you very much for the referral.   ____________________________ Laurier NancyShaukat A. Khan, MD sak:jm D: 04/18/2013 11:06:55 ET T: 04/18/2013 11:56:23 ET JOB#: [id number]  cc: Laurier NancyShaukat A. Khan, MD, <Dictator> Laurier NancySHAUKAT A KHAN MD ELECTRONICALLY SIGNED 05/02/2013 13:21

## 2015-03-27 NOTE — Op Note (Signed)
PATIENT NAME:  Virginia Barnes, Virginia Barnes MR#:  [id number] DATE OF BIRTH:  [birth year]  DATE OF PROCEDURE:  04/30/2012  PREOPERATIVE DIAGNOSIS: Left knee osteoarthritis.   POSTOPERATIVE DIAGNOSIS: Left knee osteoarthritis.   PROCEDURE: Left total knee arthroplasty.   SURGEON: Timoteo Gaul, MD  ASSISTANT: Earnestine Leys, M.D.    ANESTHESIA: Spinal with femoral nerve block and local anesthetic (0.25% Marcaine with epinephrine x30 mL).   SPECIMENS: None.   COMPLICATIONS: None.   FINDINGS ON PROCEDURE: Severe wear of the posterior medial and lateral tibial plateau with tricompartmental osteoarthritis.   INDICATIONS FOR PROCEDURE: Patient is a 69 year old female with severe tricompartmental osteoarthritis in the left knee. Patient had persistent left knee pain for over a year which has not responded to conservative treatment including corticosteroid injection. Patient opted for surgery. I explained the risks and benefits of surgery to the patient in my office prior to the date of surgery. Patient understood the risks including infection requiring the removal of the implants, bleeding requiring blood transfusion, nerve or blood vessel injury especially injury to the common peroneal nerve leading to permanent dorsal foot numbness and footdrop, fracture, dislocation, persistent knee pain or instability, failure of the hardware including loosening of the components and the need for revision knee surgery. Medical complications including deep vein thrombosis and pulmonary embolism, myocardial infarction, stroke, pneumonia, respiratory failure, and death. Patient understood these risks and wished to proceed.   PROCEDURE NOTE: I met with the patient in the preop area on the morning of surgery. I signed the left knee according to the hospital's right site protocol. I updated the patient's history and physical. Patient underwent a left femoral nerve block in the preoperative area by our anesthesia service and then  was given a spinal anesthetic in the Operating Room. She was then placed supine on the operative table. All bony prominences were adequately padded. A tourniquet was applied to her left upper thigh and she was prepped and draped in sterile fashion.   A timeout was performed to verify the patient's name, date of birth, medical record number, correct site of surgery, and correct procedure to be performed. It was also used to verify the patient had received antibiotics and that all appropriate instruments, implants, and radiographic studies were available in the room. Once all in attendance were in agreement, the case began. A proposed midline left knee incision was drawn out with a surgical marker based upon bony landmarks. The patient's left lower extremity was then exsanguinated using an Esmarch. The tourniquet was inflated to 275 mmHg for a total of 127 minutes.   Midline incision was then made using a #10 blade. A deep #10 blade was used to then create full thickness skin flaps. A medial arthrotomy was made with a deep #10 blade as well. The patella was everted and the knee was flexed to approximately 90 degrees. Hoffa's fat pad was debrided along with the medial and lateral meniscus and the cruciate ligaments. A drill was used to enter into the femoral canal for placement of the distal femoral cutting guide. The distal femoral cutting guide was then placed intramedullary. Given the fact that the patient had a slight flexion contracture of approximately 5 degrees or less an 11 mm distal femoral cut was performed. A distal tibial external alignment guide was then positioned for the proximal tibial osteotomy. Appropriate adjustments to the guide for posterior slope and a few degrees of valgus cut were made. The tibial cutting block was then pinned into position. An  osteotomy was performed. After the ostomy was performed an extension block was used and found to be tight. Additional osteotomy was then performed to  allow for placement of a 10 mm extension spacer block. The knee was then positioned back in 90 degrees of flexion. A posterior femoral sizing guide was then placed on the femur. The femur was sized to be a size 3. Pins were placed into position allowing for 3 degrees of external rotation. The size 3 distal femoral cutting guide was then malleted into position. Drill pins were then used to hold the cutting block. A guide was used to estimate the anterior femoral cut and it was found to be appropriate and would not cause anterior cortical notching. The anterior and posterior femoral cuts as well as chamfer cuts were then made. The cutting block was removed. The box cutting guide for the posterior stabilized component was then placed on the femur and pinned in position. The cuts were made for the box. The femoral intramedullary hole was then plugged with bone graft. The attention was then turned back to the tibia for tibial tray preparation.   A size 3 tibial tray was found to have adequate coverage without overhang. This was pinned into position. The drill tower was then affixed to the tibial plate and the keel was drilled. The wing punch was then used to also prepare the tibial keel. The pins were removed. All the femoral and tibial trial components along with a 10 mm tibial insert was then placed into position. The knee came out to full extension and had excellent medial and lateral stability. The ligaments were well balanced in both flexion and extension.   Finally the attention was turned to preparation of the patella. The patella was sized to a 35 mm. An osteotomy of 8 mm was performed on the patella. Three peg holes were then drilled and the patella trial was then placed. The knee was taken through a full range of motion. There was no lateral tilt or instability to the patella. All trial components were then removed and the bone surfaces were copiously irrigated using pulse lavage. The bone surfaces were  then adequately dried using suction and lap sponges. The methylmethacrylate was mixed on the back table and then applied to the tibia, distal femur and patella bone surfaces. Methyl methacrylate was also placed on the posterior aspect of all components. The tibial tray was malleted into position and excess methyl methacrylate was removed. Then the femoral component was malleted into position and again excess methyl methacrylate was removed. Finally, a size 10 tibial tray was then put into position and the knee was brought into full extension with a posteriorly directed force on the knee while the methyl methacrylate cured to allow for full extension without flexion contracture. The 35 mm three peg oval dome patella was then also placed into position and held into place with a patellar clamp. Excess methyl methacrylate was removed. Once the methyl methacrylate had cured, the knee joint was copiously irrigated again with pulse lavage. Two drains were placed at the superolateral left knee and hooked up to an Autovac. The medial arthrotomy was closed using #2 Ti-Cron in interrupted fashion. Again, the wound was copiously irrigated. The subcutaneous tissue was closed in two layers with 0 Vicryl deep and 2-0 Vicryl for more superficial subcutaneous tissue. The skin was then approximated with running 3-0 Monocryl. Steri-Strips were applied. Four 4-0 nylon sutures were placed to reinforce the incision proximally. A dry sterile dressing  was applied along with a TENS unit and Polar Care sleeve. The left knee was placed in a knee immobilizer. The patient awoke, was brought to the PAC-U in stable condition and I was scrubbed and present for the entire case and all sharp and instrument counts were correct at the conclusion of the case. I spoke with the patient's son by phone following the operation from the PAC-U to let him know that the case had gone without complication and that his mother was stable in recovery room.        COMPONENTS: The compartments used for this case were a Mitek size 3 tibial tray rotating platform MBT Keel cemented, a DePuy Sigma femoral posterior stabilized cemented size 3 femoral component and a 35 mm three peg oval dome patella with a size 3, 10 mm tibial rotating platform stabilized insert.   ____________________________ Timoteo Gaul, MD klk:cms D: 05/02/2012 17:34:00 ET T: 05/03/2012 07:48:00 ET JOB#: [id number]  cc: Timoteo Gaul, MD, <Dictator> Timoteo Gaul MD ELECTRONICALLY SIGNED 05/05/2012 19:10

## 2015-03-27 NOTE — H&P (Signed)
PATIENT NAME:  Virginia Barnes, DELUDE MR#:  [id number] DATE OF BIRTH:  1946/08/22  DATE OF ADMISSION:  05/31/2012  PRIMARY CARE PHYSICIAN: Dr. Yves Dill.   CHIEF COMPLAINT: Tongue swelling.   HISTORY OF PRESENT ILLNESS: Ms. Virginia Barnes is a 69 year old pleasant African American female who came here to the hospital Emergency Department on Tuesday, three days ago, treated for tongue swelling and she was instructed to discontinue the ACE inhibitor, lisinopril. After a few hours, the tongue swelling went away. However, today after midnight she started to have tongue swelling again. The patient came to the Emergency Department with the same complaint. The tongue swelling was on one side of the tongue. She received treatment here including Solu-Medrol, Zantac and Benadryl. However, after an hour or so the swelling extended to the rest of the tongue. The patient is now being admitted to the hospital for further treatment and monitoring of her tongue swelling. In the interim, the patient denies having any shortness of breath and no difficulty swallowing. No change in her voice. She denies ingesting anything new or taking any medication over-the-counter.   REVIEW OF SYSTEMS: CONSTITUTIONAL: Denies any fever. No chills. No fatigue. EYES: No blurring of vision. No double vision. ENT: No hearing impairment. No sore throat. No dysphagia but reports the tongue swelling. CARDIOVASCULAR: No chest pain. No shortness of breath. No syncope. RESPIRATORY: No cough. No sputum production. No hemoptysis. GASTROINTESTINAL: No abdominal pain. No vomiting, no diarrhea. GENITOURINARY: No dysuria. No frequency of urination. MUSCULOSKELETAL: No joint pain or swelling. No muscular pain or swelling. INTEGUMENTARY: No skin rash. No ulcers. NEUROLOGY: No focal weakness. No seizure activity. No headache. PSYCHIATRY: No anxiety. No depression. ENDOCRINE: No polyuria or polydipsia. No heat or cold intolerance.   PAST MEDICAL HISTORY:  1. Systemic  hypertension.  2. Type 2 diabetes mellitus. 3. Hyperlipidemia. 4. Depression.   PAST SURGICAL HISTORY:  1. Recent left total knee arthroplasty a month ago.  2. History of hysterectomy.   SOCIAL HABITS: Ex- chronic smoker. She quit smoking in 2009. No history of alcohol or drug abuse.   SOCIAL HISTORY: She is widowed. Lives at home alone.   FAMILY HISTORY: She reports one brother who has diabetes mellitus. Her mother lived until she reached the age of 72 and she passed away of natural causes. Her father died at the age of 48, reporting he had pacemaker malfunction.   ADMISSION MEDICATIONS:  1. Crestor 10 mg a day. 2. Metformin 500 mg twice a day. 3. Metoprolol 50 mg twice a day. 4. Omeprazole 40 mg once a day. 5. Zoloft 100 mg once a day. 6. Aspirin 81 mg a day.   ALLERGIES: No known drug allergies.   PHYSICAL EXAMINATION:  VITAL SIGNS: Blood pressure 125/52, respiratory rate 18, pulse 72, temperature 98.6, oxygen saturation 98%.   GENERAL APPEARANCE: Elderly lady lying in bed in no acute distress.   HEAD AND NECK EXAMINATION: No pallor. No icterus. No cyanosis.   ENT: Hearing was normal. Nasal mucosa, lips, tongue were normal, except for the tongue that is swollen. Uvula is at midline. No evidence of stridor.   EYES: Normal eyelids and conjunctiva. Pupils about 6 mm, equal and reactive to light.   NECK: Supple. Trachea at midline. No thyromegaly. No cervical lymphadenopathy. No masses.   HEART: Normal S1, S2. No S3, S4. No murmur. No gallop. No carotid bruits.   RESPIRATORY: Normal breathing pattern without use of accessory muscles. No rales. No wheezing.   ABDOMEN: Soft without tenderness.  No hepatosplenomegaly. No masses. No hernias.   SKIN: No ulcers. No subcutaneous nodules.   MUSCULOSKELETAL: No joint swelling. No clubbing.   NEUROLOGIC: Cranial nerves II through XII are intact. No focal motor deficit.   PSYCHIATRY: The patient is alert and oriented x3. Mood  and affect were normal.   LABORATORY DATA: Serum glucose 159, BUN 22, creatinine 1.3, sodium 135, potassium 4.5. CBC showed white count of 7,000, hemoglobin 12, hematocrit 39, platelet count 355.   ASSESSMENT:  1. Angioedema, primarily tongue swelling without compromise for breathing. The culprit is unclear.  2. Systemic hypertension.  3. Diabetes mellitus type II.  4. Hyperlipidemia.  5. Recent left total knee arthroplasty done a month ago.   PLAN:  1. Will admit the patient for observation. 2. Place her on IV Solu-Medrol along with Benadryl and L2 blocker using Zantac or Pepcid.  3. I will continue her home medications, but I will discontinue Zoloft.  4. Monitor her symptoms and her response.  5. I spoke with the patient regarding a LIVING WILL. She denies having any LIVING WILL, but she gave the power of attorney to make medical decisions to her son, Leverne HumblesMarshall Sirianni.   TIME SPENT EVALUATING THIS PATIENT: More than 50 minutes.   ____________________________ Carney CornersAmir M. Rudene Rearwish, MD amd:ap D: 05/31/2012 05:08:14 ET            T: 05/31/2012 09:13:52 ET              JOB#: [id number] cc: Carney CornersAmir M. Rudene Rearwish, MD, <Dictator> Margaretann LovelessNeelam S. Khan, MD Karolee OhsAMIR Dala DockM Ayvion Kavanagh MD ELECTRONICALLY SIGNED 06/01/2012 6:44

## 2015-03-27 NOTE — Discharge Summary (Signed)
PATIENT NAME:  Virginia Barnes, Virginia Barnes MR#:  [id number] DATE OF BIRTH:  1946/08/03  DATE OF ADMISSION:  04/30/2012 DATE OF DISCHARGE:  05/05/2012   ADMITTING DIAGNOSIS: Status post left total knee arthroplasty.   HOSPITAL COURSE: The patient was admitted on 04/30/2012 and underwent an uncomplicated left total knee arthroplasty. Postoperatively she was admitted to the orthopedic floor. She had daily labs drawn to ensure stable hematocrit and to check her basic metabolic profile postop. She was placed on oxycodone for pain control. On postop day #1 she had physical and occupational therapy consults called. She was also started on a CPM which she used intermittently throughout her hospitalization. She was tolerating an oral diet. On postop day #2, the patient's Foley catheter was removed. She had completed 24 hours of postop antibiotics and continued to progress with physical therapy. The patient had her dressing changed on postop day #2 as well. This was changed by the orthopedic surgeon. There was no erythema, ecchymosis, or drainage. The patient on postop days #3 and 4 continued to progress with physical therapy. Her left knee pain had dramatically improved. On postop day #5, the patient was doing well. She had already had a bowel movement. She denied any chest pain, shortness of breath, or abdominal pain and her left knee pain was well controlled on oral medication. She remained on Lovenox 30 mg b.i.d. throughout her hospitalization. Given her clinical improvement, she was prepared for discharge.   DISCHARGE INSTRUCTIONS:  1. The patient will be discharged with instructions to have daily dressing changes until the incision is completely dry.  2. She should have daily wound checks. She will remain partial weightbearing on the left lower extremity with the use of a walker.  3. She will be on Lovenox 40 mg sub-Q daily for DVT prophylaxis and continue to wear her TED stockings.  4. Her left lower extremity should be  elevated whenever the patient is in bed or in a chair.  5. She will follow-up with me in 7 to 10 days for wound check and re-evaluation.  6. Her sutures will be removed in my office.      DISCHARGE DIAGNOSIS: Status post uncomplicated left total knee arthroplasty.   ____________________________ Kathreen DevoidKevin L. Kelcey Wickstrom, MD klk:drc D: 05/05/2012 16:45:15 ET T: 05/05/2012 17:31:10 ET JOB#: [id number]  cc: Kathreen DevoidKevin L. Amariona Rathje, MD, <Dictator> Kathreen DevoidKEVIN L Zierra Laroque MD ELECTRONICALLY SIGNED 05/05/2012 19:10

## 2015-03-27 NOTE — Discharge Summary (Signed)
PATIENT NAME:  Virginia Barnes, [name] MR#:  [id number] DATE OF BIRTH:  [birth year]  DATE OF ADMISSION:  05/31/2012 DATE OF DISCHARGE:  06/02/2012   DISCHARGE DIAGNOSIS: Angioedema.   SECONDARY DIAGNOSIS: Diabetes mellitus.   DISCHARGE MEDICATIONS:  1. Ranitidine 150 mg twice a day.  2. Medrol pack use as directed.   MEDICATIONS TO DISCONTINUE: Sertraline.   The patient is to resume other home medications.    HOSPITAL COURSE: This lady was admitted through the Emergency Room where she presented with swelling of the right side of the tongue on initial presentation three days ago. She was seen at her primary care physician's office earlier that day at which time there were no symptoms of tongue swelling. The patient was admitted with diagnosis of angioedema of unclear etiology. She was placed on antihistamine and IV steroids with resolution of glossal edema within 24 hours. The patient presently denies any tongue swelling, dysphagia, or difficulty in breathing. She was discharged to home in satisfactory condition.   DIET: ADA diet.   FOLLOW-UP: Follow-up with Dr. Ellsworth Lennoxejan-Sie in one week.   ACTIVITY: As tolerated.   The patient was instructed to inform my office if she develops any further tongue swelling, difficulty swallowing, or shortness of breath.      DISCHARGE PROCESS TIME SPENT: 36 minutes.   ____________________________ Silas FloodSheikh A. Ellsworth Lennoxejan-Sie, MD sat:drc D: 06/02/2012 13:17:41 ET T: 06/02/2012 13:29:38 ET JOB#: [id number]  cc: Sheikh A. Ellsworth Lennoxejan-Sie, MD, <Dictator> Charlesetta GaribaldiSHEIKH A TEJAN-SIE MD ELECTRONICALLY SIGNED 06/10/2012 13:50

## 2015-03-28 LAB — SURGICAL PATHOLOGY

## 2015-03-31 ENCOUNTER — Other Ambulatory Visit: Payer: Self-pay

## 2015-03-31 DIAGNOSIS — Z1231 Encounter for screening mammogram for malignant neoplasm of breast: Secondary | ICD-10-CM

## 2015-05-03 ENCOUNTER — Ambulatory Visit: Payer: Self-pay

## 2015-05-05 ENCOUNTER — Other Ambulatory Visit: Payer: Self-pay | Admitting: Internal Medicine

## 2015-05-05 DIAGNOSIS — Z1231 Encounter for screening mammogram for malignant neoplasm of breast: Secondary | ICD-10-CM

## 2015-05-11 ENCOUNTER — Other Ambulatory Visit: Payer: Self-pay | Admitting: Internal Medicine

## 2015-05-11 ENCOUNTER — Ambulatory Visit
Admission: RE | Admit: 2015-05-11 | Discharge: 2015-05-11 | Disposition: A | Discharge status: Home / Self Care | Payer: Medicare HMO | Source: Ambulatory Visit | Attending: Internal Medicine | Admitting: Internal Medicine

## 2015-05-11 DIAGNOSIS — Z1231 Encounter for screening mammogram for malignant neoplasm of breast: Secondary | ICD-10-CM

## 2015-08-12 ENCOUNTER — Emergency Department
Admission: EM | Admit: 2015-08-12 | Discharge: 2015-08-12 | Disposition: A | Discharge status: Home / Self Care | Payer: Medicare HMO | Attending: Emergency Medicine | Admitting: Emergency Medicine

## 2015-08-12 ENCOUNTER — Encounter: Payer: Self-pay | Admitting: Emergency Medicine

## 2015-08-12 DIAGNOSIS — Z87891 Personal history of nicotine dependence: Secondary | ICD-10-CM | POA: Diagnosis not present

## 2015-08-12 DIAGNOSIS — K921 Melena: Secondary | ICD-10-CM | POA: Diagnosis present

## 2015-08-12 DIAGNOSIS — I10 Essential (primary) hypertension: Secondary | ICD-10-CM | POA: Diagnosis not present

## 2015-08-12 DIAGNOSIS — R197 Diarrhea, unspecified: Secondary | ICD-10-CM | POA: Diagnosis not present

## 2015-08-12 DIAGNOSIS — E119 Type 2 diabetes mellitus without complications: Secondary | ICD-10-CM | POA: Diagnosis not present

## 2015-08-12 HISTORY — DX: Essential (primary) hypertension: I10

## 2015-08-12 HISTORY — DX: Type 2 diabetes mellitus without complications: E11.9

## 2015-08-12 HISTORY — DX: Diverticulitis of intestine, part unspecified, without perforation or abscess without bleeding: K57.92

## 2015-08-12 LAB — COMPREHENSIVE METABOLIC PANEL
ALK PHOS: 56 U/L (ref 38–126)
ALT: 16 U/L (ref 14–54)
AST: 21 U/L (ref 15–41)
Albumin: 3.7 g/dL (ref 3.5–5.0)
Anion gap: 9 (ref 5–15)
BUN: 16 mg/dL (ref 6–20)
CALCIUM: 9.5 mg/dL (ref 8.9–10.3)
CO2: 28 mmol/L (ref 22–32)
Chloride: 103 mmol/L (ref 101–111)
Creatinine, Ser: 0.93 mg/dL (ref 0.44–1.00)
GFR calc non Af Amer: >60 mL/min (ref >60)
Glucose, Bld: 97 mg/dL (ref 65–99)
Potassium: 4.1 mmol/L (ref 3.5–5.1)
Sodium: 140 mmol/L (ref 135–145)
Total Protein: 7.5 g/dL (ref 6.5–8.1)

## 2015-08-12 LAB — CBC
HCT: 35.9 % (ref 35.0–47.0)
Hemoglobin: 11 g/dL — ABNORMAL LOW (ref 12.0–16.0)
MCH: 24.4 pg — ABNORMAL LOW (ref 26.0–34.0)
MCHC: 30.8 g/dL — AB (ref 32.0–36.0)
MCV: 79.2 fL — ABNORMAL LOW (ref 80.0–100.0)
PLATELETS: 444 10*3/uL — AB (ref 150–440)
RBC: 4.53 MIL/uL (ref 3.80–5.20)
RDW: 15.8 % — ABNORMAL HIGH (ref 11.5–14.5)
WBC: 8.5 10*3/uL (ref 3.6–11.0)

## 2015-08-12 LAB — ABO/RH: ABO/RH(D): B POS

## 2015-08-12 LAB — TYPE AND SCREEN
ABO/RH(D): B POS
ANTIBODY SCREEN: NEGATIVE

## 2015-08-12 NOTE — ED Provider Notes (Signed)
[facility] Emergency Department Provider Note  ____________________________________________  Time seen: Approximately 930 PM  I have reviewed the triage vital signs and the nursing notes.   HISTORY  Chief Complaint Melena    HPI Virginia Barnes is a 69 y.o. female with a recent diagnosis of diverticulitis was presenting with black stool over the past 10 days. She said that she was treated with Cipro and Flagyl starting last Wednesday and ending this past Wednesday 2 days ago. She says that she had abdominal pain about 10 days ago which has since resolved. However, she says that she has had black stool which is been increasing and frequently. She says that it is "loose" and had 3 episodes today and 4 yesterday. Denies any abdominal pain at this point. Denies any nausea and vomiting.Was sent into the emergency department today by her primary care doctors because of the black stool.   Past Medical History  Diagnosis Date  . Diverticulitis   . Diabetes mellitus without complication   . Hypertension     There are no active problems to display for this patient.   Past Surgical History  Procedure Laterality Date  . Abdominal hysterectomy    . Joint replacement      No current outpatient prescriptions on file.  Allergies Peanuts and Lisinopril  Family History  Problem Relation Age of Onset  . Stomach cancer Sister 18  . Vaginal cancer Sister 4    Social History Social History  Substance Use Topics  . Smoking status: Former Games developer  . Smokeless tobacco: None  . Alcohol Use: No    Review of Systems Constitutional: No fever/chills Eyes: No visual changes. ENT: No sore throat. Cardiovascular: Denies chest pain. Respiratory: Denies shortness of breath. Gastrointestinal: No abdominal pain.  No nausea, no vomiting.  No diarrhea.  No constipation. Genitourinary: Negative for dysuria. Musculoskeletal: Negative for back pain. Skin: Negative for  rash. Neurological: Negative for headaches, focal weakness or numbness.  10-point ROS otherwise negative.  ____________________________________________   PHYSICAL EXAM:  VITAL SIGNS: ED Triage Vitals  Enc Vitals Group     BP 08/12/15 1608 138/54 mmHg     Pulse Rate 08/12/15 1608 72     Resp 08/12/15 1608 18     Temp 08/12/15 1608 98.5 F (36.9 C)     Temp Source 08/12/15 1608 Oral     SpO2 08/12/15 1608 98 %     Weight 08/12/15 1608 219 lb (99.338 kg)     Height 08/12/15 1608  (1.753 m)     Head Cir --      Peak Flow --      Pain Score --      Pain Loc --      Pain Edu? --      Excl. in GC? --     Constitutional: Alert and oriented. Well appearing and in no acute distress. Eyes: Conjunctivae are normal. PERRL. EOMI. Head: Atraumatic. Nose: No congestion/rhinnorhea. Mouth/Throat: Mucous membranes are moist.  Oropharynx non-erythematous. Neck: No stridor.   Cardiovascular: Normal rate, regular rhythm. Grossly normal heart sounds.  Good peripheral circulation. Respiratory: Normal respiratory effort.  No retractions. Lungs CTAB. Gastrointestinal: Soft and nontender. No distention. No abdominal bruits. No CVA tenderness. Rectal exam with brown stool which is heme negative. Musculoskeletal: No lower extremity tenderness nor edema.  No joint effusions. Neurologic:  Normal speech and language. No gross focal neurologic deficits are appreciated. No gait instability. Skin:  Skin is warm, dry and  intact. No rash noted. Psychiatric: Mood and affect are normal. Speech and behavior are normal.  ____________________________________________   LABS (all labs ordered are listed, but only abnormal results are displayed)  Labs Reviewed  COMPREHENSIVE METABOLIC PANEL - Abnormal; Notable for the following:    Total Bilirubin <0.1 (*)    All other components within normal limits  CBC - Abnormal; Notable for the following:    Hemoglobin 11.0 (*)    MCV 79.2 (*)    MCH 24.4 (*)     MCHC 30.8 (*)    RDW 15.8 (*)    Platelets 444 (*)    All other components within normal limits  TYPE AND SCREEN  ABO/RH   ____________________________________________  EKG   ____________________________________________  RADIOLOGY   ____________________________________________   PROCEDURES    ____________________________________________   INITIAL IMPRESSION / ASSESSMENT AND PLAN / ED COURSE  Pertinent labs & imaging results that were available during my care of the patient were reviewed by me and considered in my medical decision making (see chart for details).  Unclear cause of stool discoloration at home. Patient says that she does not take iron supplements. Has not been taking any Pepto-Bismol either. Do not think this is C. difficile infection as there are only several episodes of loose but not diarrhea type stools per day. Labs are reassuring as well. Patient has follow-up this Thursday with her primary care doctor for an outpatient CAT scan as well as reevaluation the office. We'll discharge to home. Discussed the lab findings as well as possible diagnoses and cause of the stool with the patient as well as her family. Possible medication side effect resulting in the stool change.  ____________________________________________   FINAL CLINICAL IMPRESSION(S) / ED DIAGNOSES  Acute diarrhea. Initial visit.    Virginia Blazer, MD 08/12/15 [phone]

## 2015-08-12 NOTE — ED Notes (Signed)
Pt states she was started on cipro and flagyl a week ago for diverticulitis, states she has finished the medications she has been having dark black diarrhea stools, states abd. Pain has improved, denies any n,v

## 2015-08-12 NOTE — Discharge Instructions (Signed)

## 2015-11-21 ENCOUNTER — Emergency Department: Payer: Medicare HMO

## 2015-11-21 ENCOUNTER — Emergency Department
Admission: EM | Admit: 2015-11-21 | Discharge: 2015-11-21 | Disposition: A | Discharge status: Other Acute Inpatient Hospital | Payer: Medicare HMO | Attending: Emergency Medicine | Admitting: Emergency Medicine

## 2015-11-21 ENCOUNTER — Encounter: Payer: Self-pay | Admitting: Emergency Medicine

## 2015-11-21 DIAGNOSIS — Y9389 Activity, other specified: Secondary | ICD-10-CM | POA: Diagnosis not present

## 2015-11-21 DIAGNOSIS — Y998 Other external cause status: Secondary | ICD-10-CM | POA: Insufficient documentation

## 2015-11-21 DIAGNOSIS — I10 Essential (primary) hypertension: Secondary | ICD-10-CM | POA: Insufficient documentation

## 2015-11-21 DIAGNOSIS — T1490XA Injury, unspecified, initial encounter: Secondary | ICD-10-CM

## 2015-11-21 DIAGNOSIS — E119 Type 2 diabetes mellitus without complications: Secondary | ICD-10-CM | POA: Diagnosis not present

## 2015-11-21 DIAGNOSIS — Z87891 Personal history of nicotine dependence: Secondary | ICD-10-CM | POA: Insufficient documentation

## 2015-11-21 DIAGNOSIS — F419 Anxiety disorder, unspecified: Secondary | ICD-10-CM | POA: Diagnosis not present

## 2015-11-21 DIAGNOSIS — S01112A Laceration without foreign body of left eyelid and periocular area, initial encounter: Secondary | ICD-10-CM | POA: Diagnosis not present

## 2015-11-21 DIAGNOSIS — Y9289 Other specified places as the place of occurrence of the external cause: Secondary | ICD-10-CM | POA: Insufficient documentation

## 2015-11-21 DIAGNOSIS — S0993XA Unspecified injury of face, initial encounter: Secondary | ICD-10-CM | POA: Diagnosis present

## 2015-11-21 HISTORY — DX: Heart failure, unspecified: I50.9

## 2015-11-21 LAB — BASIC METABOLIC PANEL
ANION GAP: 10 (ref 5–15)
BUN: 15 mg/dL (ref 6–20)
CHLORIDE: 106 mmol/L (ref 101–111)
CO2: 23 mmol/L (ref 22–32)
CREATININE: 0.92 mg/dL (ref 0.44–1.00)
Calcium: 9.2 mg/dL (ref 8.9–10.3)
GFR calc non Af Amer: >60 mL/min (ref >60)
Glucose, Bld: 124 mg/dL — ABNORMAL HIGH (ref 65–99)
POTASSIUM: 4.8 mmol/L (ref 3.5–5.1)
SODIUM: 139 mmol/L (ref 135–145)

## 2015-11-21 LAB — CBC WITH DIFFERENTIAL/PLATELET
BASOS ABS: 0.1 10*3/uL (ref 0–0.1)
BASOS PCT: 1 %
EOS ABS: 0 10*3/uL (ref 0–0.7)
Eosinophils Relative: 0 %
HEMATOCRIT: 36.7 % (ref 35.0–47.0)
HEMOGLOBIN: 11.4 g/dL — AB (ref 12.0–16.0)
Lymphocytes Relative: 9 %
Lymphs Abs: 1 10*3/uL (ref 1.0–3.6)
MCH: 25.3 pg — ABNORMAL LOW (ref 26.0–34.0)
MCHC: 31.1 g/dL — AB (ref 32.0–36.0)
MCV: 81.5 fL (ref 80.0–100.0)
MONOS PCT: 6 %
Monocytes Absolute: 0.6 10*3/uL (ref 0.2–0.9)
NEUTROS ABS: 9.4 10*3/uL — AB (ref 1.4–6.5)
NEUTROS PCT: 84 %
Platelets: 291 10*3/uL (ref 150–440)
RBC: 4.5 MIL/uL (ref 3.80–5.20)
RDW: 15.8 % — ABNORMAL HIGH (ref 11.5–14.5)
WBC: 11.1 10*3/uL — AB (ref 3.6–11.0)

## 2015-11-21 LAB — PROTIME-INR
INR: 0.97
PROTHROMBIN TIME: 13.1 s (ref 11.4–15.0)

## 2015-11-21 LAB — APTT: APTT: 24 s (ref 24–36)

## 2015-11-21 MED ORDER — LIDOCAINE-EPINEPHRINE (PF) 1 %-1:200000 IJ SOLN
INTRAMUSCULAR | Status: AC
  Administered 2015-11-21: 30 mL via INTRADERMAL
  Filled 2015-11-21: qty 30

## 2015-11-21 MED ORDER — IOHEXOL 300 MG/ML  SOLN
100.0000 mL | Freq: Once | INTRAMUSCULAR | Status: AC | PRN
  Administered 2015-11-21: 100 mL via INTRAVENOUS

## 2015-11-21 MED ORDER — TETANUS-DIPHTH-ACELL PERTUSSIS 5-2.5-18.5 LF-MCG/0.5 IM SUSP
0.5000 mL | Freq: Once | INTRAMUSCULAR | Status: AC
  Administered 2015-11-21: 0.5 mL via INTRAMUSCULAR
  Filled 2015-11-21: qty 0.5

## 2015-11-21 MED ORDER — LIDOCAINE-EPINEPHRINE (PF) 1 %-1:200000 IJ SOLN
30.0000 mL | Freq: Once | INTRAMUSCULAR | Status: AC
  Administered 2015-11-21: 30 mL via INTRADERMAL

## 2015-11-21 NOTE — ED Notes (Signed)
PT to CT.

## 2015-11-21 NOTE — ED Notes (Signed)
Laceration to left eyebrow, hematoma to back of right head.
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
## 2015-11-21 NOTE — ED Notes (Signed)
[facility] Transport at bedside. Report to Kat, CalifPelican MarshorniaRN
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
## 2016-04-04 ENCOUNTER — Other Ambulatory Visit: Payer: Self-pay | Admitting: Internal Medicine

## 2016-04-04 DIAGNOSIS — Z1231 Encounter for screening mammogram for malignant neoplasm of breast: Secondary | ICD-10-CM

## 2016-04-09 ENCOUNTER — Other Ambulatory Visit: Payer: Self-pay | Admitting: Cardiovascular Disease

## 2016-04-13 ENCOUNTER — Ambulatory Visit
Admission: RE | Admit: 2016-04-13 | Discharge: 2016-04-13 | Disposition: A | Discharge status: Home / Self Care | Payer: Medicare HMO | Source: Ambulatory Visit | Attending: Cardiovascular Disease | Admitting: Cardiovascular Disease

## 2016-04-13 ENCOUNTER — Encounter
Admission: RE | Disposition: A | Discharge status: Home / Self Care | Payer: Self-pay | Source: Ambulatory Visit | Attending: Cardiovascular Disease

## 2016-04-13 ENCOUNTER — Encounter: Payer: Self-pay | Admitting: *Deleted

## 2016-04-13 DIAGNOSIS — Z8249 Family history of ischemic heart disease and other diseases of the circulatory system: Secondary | ICD-10-CM | POA: Diagnosis not present

## 2016-04-13 DIAGNOSIS — K210 Gastro-esophageal reflux disease with esophagitis: Secondary | ICD-10-CM | POA: Diagnosis not present

## 2016-04-13 DIAGNOSIS — R0600 Dyspnea, unspecified: Secondary | ICD-10-CM | POA: Insufficient documentation

## 2016-04-13 DIAGNOSIS — E119 Type 2 diabetes mellitus without complications: Secondary | ICD-10-CM | POA: Insufficient documentation

## 2016-04-13 DIAGNOSIS — Z79899 Other long term (current) drug therapy: Secondary | ICD-10-CM | POA: Diagnosis not present

## 2016-04-13 DIAGNOSIS — E782 Mixed hyperlipidemia: Secondary | ICD-10-CM | POA: Insufficient documentation

## 2016-04-13 DIAGNOSIS — E079 Disorder of thyroid, unspecified: Secondary | ICD-10-CM | POA: Insufficient documentation

## 2016-04-13 DIAGNOSIS — Z7982 Long term (current) use of aspirin: Secondary | ICD-10-CM | POA: Diagnosis not present

## 2016-04-13 DIAGNOSIS — Z91010 Allergy to peanuts: Secondary | ICD-10-CM | POA: Diagnosis not present

## 2016-04-13 DIAGNOSIS — I110 Hypertensive heart disease with heart failure: Secondary | ICD-10-CM | POA: Diagnosis not present

## 2016-04-13 DIAGNOSIS — Z87891 Personal history of nicotine dependence: Secondary | ICD-10-CM | POA: Diagnosis not present

## 2016-04-13 DIAGNOSIS — R002 Palpitations: Secondary | ICD-10-CM | POA: Diagnosis not present

## 2016-04-13 DIAGNOSIS — E049 Nontoxic goiter, unspecified: Secondary | ICD-10-CM | POA: Insufficient documentation

## 2016-04-13 DIAGNOSIS — R079 Chest pain, unspecified: Secondary | ICD-10-CM | POA: Insufficient documentation

## 2016-04-13 DIAGNOSIS — Z888 Allergy status to other drugs, medicaments and biological substances status: Secondary | ICD-10-CM | POA: Insufficient documentation

## 2016-04-13 DIAGNOSIS — Z8261 Family history of arthritis: Secondary | ICD-10-CM | POA: Insufficient documentation

## 2016-04-13 DIAGNOSIS — Z7984 Long term (current) use of oral hypoglycemic drugs: Secondary | ICD-10-CM | POA: Diagnosis not present

## 2016-04-13 DIAGNOSIS — Z96653 Presence of artificial knee joint, bilateral: Secondary | ICD-10-CM | POA: Insufficient documentation

## 2016-04-13 DIAGNOSIS — Z90710 Acquired absence of both cervix and uterus: Secondary | ICD-10-CM | POA: Diagnosis not present

## 2016-04-13 DIAGNOSIS — I509 Heart failure, unspecified: Secondary | ICD-10-CM | POA: Diagnosis not present

## 2016-04-13 DIAGNOSIS — F419 Anxiety disorder, unspecified: Secondary | ICD-10-CM | POA: Insufficient documentation

## 2016-04-13 DIAGNOSIS — I456 Pre-excitation syndrome: Secondary | ICD-10-CM | POA: Diagnosis not present

## 2016-04-13 DIAGNOSIS — Z86010 Personal history of colonic polyps: Secondary | ICD-10-CM | POA: Diagnosis not present

## 2016-04-13 HISTORY — PX: CARDIAC CATHETERIZATION: SHX172

## 2016-04-13 LAB — GLUCOSE, CAPILLARY: Glucose-Capillary: 119 mg/dL — ABNORMAL HIGH (ref 65–99)

## 2016-04-13 SURGERY — LEFT HEART CATH AND CORONARY ANGIOGRAPHY
Anesthesia: Moderate Sedation | Laterality: Left

## 2016-04-13 MED ORDER — HEPARIN (PORCINE) IN NACL 2-0.9 UNIT/ML-% IJ SOLN
INTRAMUSCULAR | Status: AC
  Filled 2016-04-13: qty 500

## 2016-04-13 MED ORDER — MIDAZOLAM HCL 2 MG/2ML IJ SOLN
INTRAMUSCULAR | Status: DC | PRN
  Administered 2016-04-13: 1 mg via INTRAVENOUS

## 2016-04-13 MED ORDER — IOPAMIDOL (ISOVUE-300) INJECTION 61%
INTRAVENOUS | Status: DC | PRN
  Administered 2016-04-13: 90 mL via INTRAVENOUS

## 2016-04-13 MED ORDER — FENTANYL CITRATE (PF) 100 MCG/2ML IJ SOLN
INTRAMUSCULAR | Status: DC | PRN
  Administered 2016-04-13: 25 ug via INTRAVENOUS

## 2016-04-13 MED ORDER — MIDAZOLAM HCL 2 MG/2ML IJ SOLN
INTRAMUSCULAR | Status: AC
  Filled 2016-04-13: qty 2

## 2016-04-13 MED ORDER — FENTANYL CITRATE (PF) 100 MCG/2ML IJ SOLN
INTRAMUSCULAR | Status: AC
  Filled 2016-04-13: qty 2

## 2016-04-13 SURGICAL SUPPLY — 10 items
CATH INFINITI 5 FR 3DRC (CATHETERS) ×3 IMPLANT
CATH INFINITI 5FR ANG PIGTAIL (CATHETERS) ×3 IMPLANT
CATH INFINITI 5FR JL4 (CATHETERS) ×3 IMPLANT
CATH INFINITI JR4 5F (CATHETERS) ×3 IMPLANT
DEVICE CLOSURE MYNXGRIP 5F (Vascular Products) ×3 IMPLANT
KIT MANI 3VAL PERCEP (MISCELLANEOUS) ×3 IMPLANT
NEEDLE PERC 18GX7CM (NEEDLE) ×3 IMPLANT
PACK CARDIAC CATH (CUSTOM PROCEDURE TRAY) ×3 IMPLANT
SHEATH PINNACLE 5F 10CM (SHEATH) ×3 IMPLANT
WIRE EMERALD 3MM-J .035X150CM (WIRE) ×3 IMPLANT

## 2016-04-13 NOTE — OR Nursing (Signed)
13:20 Discharged home with a ride.  IV's were discontinued and Vital signs stable. Rt groin site without bleeding or swelling.

## 2016-04-14 ENCOUNTER — Encounter: Payer: Self-pay | Admitting: Cardiovascular Disease

## 2016-05-14 ENCOUNTER — Ambulatory Visit: Payer: Medicare HMO

## 2016-05-16 ENCOUNTER — Other Ambulatory Visit: Payer: Self-pay | Admitting: Internal Medicine

## 2016-05-16 ENCOUNTER — Ambulatory Visit
Admission: RE | Admit: 2016-05-16 | Discharge: 2016-05-16 | Disposition: A | Discharge status: Home / Self Care | Payer: Medicare HMO | Source: Ambulatory Visit | Attending: Internal Medicine | Admitting: Internal Medicine

## 2016-05-16 DIAGNOSIS — Z1231 Encounter for screening mammogram for malignant neoplasm of breast: Secondary | ICD-10-CM | POA: Diagnosis not present

## 2017-01-07 ENCOUNTER — Encounter: Payer: Self-pay | Admitting: Podiatry

## 2017-01-07 ENCOUNTER — Ambulatory Visit (INDEPENDENT_AMBULATORY_CARE_PROVIDER_SITE_OTHER): Payer: Self-pay | Admitting: Podiatry

## 2017-01-07 DIAGNOSIS — M779 Enthesopathy, unspecified: Secondary | ICD-10-CM

## 2017-01-07 DIAGNOSIS — M25571 Pain in right ankle and joints of right foot: Secondary | ICD-10-CM | POA: Diagnosis not present

## 2017-01-07 DIAGNOSIS — M659 Synovitis and tenosynovitis, unspecified: Secondary | ICD-10-CM | POA: Diagnosis not present
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
## 2017-01-21 ENCOUNTER — Ambulatory Visit (INDEPENDENT_AMBULATORY_CARE_PROVIDER_SITE_OTHER): Payer: Medicare HMO | Admitting: Podiatry

## 2017-01-21 ENCOUNTER — Encounter: Payer: Self-pay | Admitting: Podiatry

## 2017-01-21 DIAGNOSIS — M659 Synovitis and tenosynovitis, unspecified: Secondary | ICD-10-CM | POA: Diagnosis not present

## 2017-01-21 DIAGNOSIS — M25571 Pain in right ankle and joints of right foot: Secondary | ICD-10-CM

## 2017-01-21 DIAGNOSIS — M65979 Unspecified synovitis and tenosynovitis, unspecified ankle and foot: Secondary | ICD-10-CM

## 2017-01-21 NOTE — Progress Notes (Signed)
Patient returns to the office with continued pain noted in the sinus tarsi of the right foot says that she was treated with injection therapy and an elastic sock. He said she received 3 days worth of pain for the ankle but since then the pain has returned. He says the pain is at the same level as it was initially resents to the office for continued evaluation and treatment of her ankle  GENERAL APPEARANCE: Alert, conversant. Appropriately groomed. No acute distress.  VASCULAR: Pedal pulses are  palpable at  [facility].  Capillary refill time is immediate to all digits,  Normal temperature gradient.  Digital hair growth is present bilateral  NEUROLOGIC: sensation is normal to 5.07 monofilament at 5/5 sites bilateral.  Light touch is intact bilateral, Muscle strength normal.  MUSCULOSKELETAL: acceptable muscle strength, tone and stability bilateral.  Intrinsic muscluature intact bilateral.  Rectus appearance of foot and digits noted bilateral. Palpable pain sinus tarsi right foot.  No redness or swelling noted.  DERMATOLOGIC: skin color, texture, and turgor are within normal limits.  No preulcerative lesions or ulcers  are seen, no interdigital maceration noted.  No open lesions present.  Digital nails are asymptomatic. No drainage noted. Callus sub 5th tight foot.  Sinus tarsitis  Ankle synovitis right ankle  ROV  Injection therapy right sinus tarsi.  Told her to continue wearing the compression sock after providing the injection. I determined that the sinus tarsi must be covered by arthritic bone since it was difficult to penetrate the sinus tarsi itself. Return to the office when necessary   Helane GuntherGregory Brown Dunlap DPM

## 2017-03-28 ENCOUNTER — Other Ambulatory Visit (HOSPITAL_COMMUNITY): Payer: Self-pay | Admitting: Orthopedic Surgery

## 2017-03-28 DIAGNOSIS — M545 Low back pain: Secondary | ICD-10-CM

## 2017-04-09 ENCOUNTER — Other Ambulatory Visit: Payer: Self-pay | Admitting: Internal Medicine

## 2017-04-09 DIAGNOSIS — Z1231 Encounter for screening mammogram for malignant neoplasm of breast: Secondary | ICD-10-CM

## 2017-04-10 ENCOUNTER — Encounter
Admission: RE | Admit: 2017-04-10 | Discharge: 2017-04-10 | Disposition: A | Discharge status: Home / Self Care | Payer: Medicare HMO | Source: Ambulatory Visit | Attending: Orthopedic Surgery | Admitting: Orthopedic Surgery

## 2017-04-10 DIAGNOSIS — M545 Low back pain: Secondary | ICD-10-CM

## 2017-04-10 MED ORDER — TECHNETIUM TC 99M MEDRONATE IV KIT
20.5650 | PACK | Freq: Once | INTRAVENOUS | Status: AC | PRN
  Administered 2017-04-10: 20.565 via INTRAVENOUS

## 2017-05-09 ENCOUNTER — Other Ambulatory Visit: Payer: Self-pay | Admitting: Orthopedic Surgery

## 2017-05-09 DIAGNOSIS — M5412 Radiculopathy, cervical region: Secondary | ICD-10-CM

## 2017-05-15 ENCOUNTER — Telehealth: Payer: Self-pay | Admitting: *Deleted

## 2017-05-15 NOTE — Telephone Encounter (Signed)
Pt was difficult to understand on the phone message, need to know about something for her ankle. Unable to leave message for pt to call due to voicemail not set up.

## 2017-05-17 ENCOUNTER — Ambulatory Visit
Admission: RE | Admit: 2017-05-17 | Discharge: 2017-05-17 | Disposition: A | Discharge status: Home / Self Care | Payer: Medicare HMO | Source: Ambulatory Visit | Attending: Internal Medicine | Admitting: Internal Medicine

## 2017-05-17 DIAGNOSIS — Z1231 Encounter for screening mammogram for malignant neoplasm of breast: Secondary | ICD-10-CM | POA: Insufficient documentation

## 2017-05-17 DIAGNOSIS — M25569 Pain in unspecified knee: Secondary | ICD-10-CM | POA: Insufficient documentation

## 2017-05-20 ENCOUNTER — Ambulatory Visit (INDEPENDENT_AMBULATORY_CARE_PROVIDER_SITE_OTHER): Payer: Medicare HMO | Admitting: Podiatry

## 2017-05-20 DIAGNOSIS — M659 Synovitis and tenosynovitis, unspecified: Secondary | ICD-10-CM

## 2017-05-20 DIAGNOSIS — E119 Type 2 diabetes mellitus without complications: Secondary | ICD-10-CM

## 2017-05-20 DIAGNOSIS — B351 Tinea unguium: Secondary | ICD-10-CM | POA: Diagnosis not present

## 2017-05-20 DIAGNOSIS — M79676 Pain in unspecified toe(s): Secondary | ICD-10-CM | POA: Diagnosis not present

## 2017-05-20 DIAGNOSIS — M25571 Pain in right ankle and joints of right foot: Secondary | ICD-10-CM

## 2017-05-20 NOTE — Progress Notes (Signed)
This patient presents the office for continued evaluation of her painful right ankle. She says the ankle is painful and swollen and the injections she received have not relieved that her pain for long.  She says she was seen by her medical doctor who ordered a CT scan which she received on 05/17/2017.  She presents the office today for continued evaluation of her ankle with her CT scan on a disc.  She also presents the office for her long thick painful toenails, both feet  GENERAL APPEARANCE: Alert, conversant. Appropriately groomed. No acute distress.  VASCULAR: Pedal pulses are  palpable at  [facility] and PT bilateral.  Capillary refill time is immediate to all digits,  Normal temperature gradient.  Digital hair growth is present bilateral  NEUROLOGIC: sensation is normal to 5.07 monofilament at 5/5 sites bilateral.  Light touch is intact bilateral, Muscle strength normal.  MUSCULOSKELETAL: acceptable muscle strength, tone and stability bilateral.  Intrinsic muscluature intact bilateral.  Rectus appearance of foot and digits noted bilateral. Sinus tarsi pain persists with swelling extending from her right ankle to the top of her forefoot.   NAILS  Thick disfigured discolored nails both feet.  No bacterial infection or drainage noted DERMATOLOGIC: skin color, texture, and turgor are within normal limits.  No preulcerative lesions or ulcers  are seen, no interdigital maceration noted.  No open lesions present.  . No drainage noted.  Sinus tarsitis with ankle synovitis, right ankle   . Onychomycosis  Debridement of her mycotic nails both feet.  Angie  she was unable to open the CT scan and the report.  I informed this patient that she needs to make an appointment with Dr. Logan BoresEvans and wearing her CT scan to be evaluated by him.  Patient was not pleased that she could not get her nails done and her ankle. examined the same day.  Therefore, I waived the co-pay for her visit today . Since she actually made an  appointment for her right ankle.  She was also told to get a written report from the doctor who took the CT scan.  I believe she should have returned to the doctor who ordered the CT scan for an evaluation and explanation of her ankle results.  I did not order the test and neither did any of the doctors from the [facility].  Helane GuntherGregory Tayia Stonesifer DPM

## 2017-05-24 ENCOUNTER — Ambulatory Visit: Payer: Medicare HMO

## 2017-05-24 ENCOUNTER — Ambulatory Visit (INDEPENDENT_AMBULATORY_CARE_PROVIDER_SITE_OTHER): Payer: Medicare HMO | Admitting: Podiatry

## 2017-05-24 ENCOUNTER — Encounter: Payer: Self-pay | Admitting: Podiatry

## 2017-05-24 DIAGNOSIS — M659 Synovitis and tenosynovitis, unspecified: Secondary | ICD-10-CM | POA: Diagnosis not present

## 2017-05-24 DIAGNOSIS — M7751 Other enthesopathy of right foot: Secondary | ICD-10-CM | POA: Diagnosis not present

## 2017-05-24 DIAGNOSIS — M65979 Unspecified synovitis and tenosynovitis, unspecified ankle and foot: Secondary | ICD-10-CM

## 2017-05-28 NOTE — Progress Notes (Signed)
   Subjective:  Patient presents today for intermittent pain and tenderness to the lateral right ankle and posterior heel that has been ongoing for one year. Patient states the pain is worse at night. She has not had anything to treat pain. Patient presents for further treatment and evaluation.   Objective / Physical Exam:  General:  The patient is alert and oriented x3 in no acute distress. Dermatology:  Skin is warm, dry and supple bilateral lower extremities. Negative for open lesions or macerations. Vascular:  Palpable pedal pulses bilaterally. No edema or erythema noted. Capillary refill within normal limits. Neurological:  Epicritic and protective threshold grossly intact bilaterally.  Musculoskeletal Exam:  Pain on palpation to the anterior lateral medial aspects of the patient's right ankle. Mild edema noted.  Range of motion within normal limits to all pedal and ankle joints bilateral. Muscle strength 5/5 in all groups bilateral.     Assessment: #1 pain in right ankle #2 synovitis of right ankle #3 capsulitis of right ankle  Plan of Care:  #1 Patient was evaluated. #2 injection of 0.5 mL Celestone Soluspan injected in the patient's right ankle. #3 ankle brace dispensed #4 patient is to return to clinic in 4 weeks  Felecia ShellingBrent M. Kashay Cavenaugh, DPM [facility]  Dr. Felecia ShellingBrent M. Angelo Caroll, DPM    [address]                                        [place], [place] [address]                Office [phone]  Fax [phone]

## 2017-05-30 MED ORDER — BETAMETHASONE SOD PHOS & ACET 6 (3-3) MG/ML IJ SUSP: 3.0000 mg | Freq: Once | INTRAMUSCULAR | Status: DC

## 2017-05-31 ENCOUNTER — Ambulatory Visit: Payer: Medicare HMO | Admitting: Podiatry

## 2017-06-07 ENCOUNTER — Other Ambulatory Visit: Payer: Self-pay | Admitting: Infectious Diseases

## 2017-06-21 ENCOUNTER — Ambulatory Visit (INDEPENDENT_AMBULATORY_CARE_PROVIDER_SITE_OTHER): Payer: Medicare HMO | Admitting: Podiatry

## 2017-06-21 DIAGNOSIS — M659 Synovitis and tenosynovitis, unspecified: Secondary | ICD-10-CM | POA: Diagnosis not present

## 2017-06-21 DIAGNOSIS — M65979 Unspecified synovitis and tenosynovitis, unspecified ankle and foot: Secondary | ICD-10-CM

## 2017-06-29 NOTE — Progress Notes (Signed)
   HPI: Patient presents today for follow-up treatment and evaluation of right ankle synovitis. Patient states that she's doing much better. She believes the injection and ankle brace is helping. Patient denies any pain at the moment.     Physical Exam: General: The patient is alert and oriented x3 in no acute distress.  Dermatology: Skin is warm, dry and supple bilateral lower extremities. Negative for open lesions or macerations.  Vascular: Palpable pedal pulses bilaterally. No edema or erythema noted. Capillary refill within normal limits.  Neurological: Epicritic and protective threshold grossly intact bilaterally.   Musculoskeletal Exam: Negative for any pain on palpation to the anterior medial and lateral aspects of the right ankle joint. Range of motion within normal limits to all pedal and ankle joints bilateral. Muscle strength 5/5 in all groups bilateral.   Assessment: 1. Synovitis right ankle-resolved   Plan of Care:  1. Patient was evaluated. 2. Today recommend the patient continues to wear the ankle brace when necessary during long periods of standing 3. Discussed the importance of good sneakers 4. Patient believes the injection helped significantly. Return to clinic when necessary   Felecia ShellingBrent M. Evans, DPM [facility]  Dr. Felecia ShellingBrent M. Evans, DPM    [address]                                        [place], [place] [address]                Office [phone]  Fax [phone]

## 2017-10-24 ENCOUNTER — Emergency Department: Payer: Medicare HMO

## 2017-10-24 ENCOUNTER — Encounter: Payer: Self-pay | Admitting: Emergency Medicine

## 2017-10-24 ENCOUNTER — Emergency Department
Admission: EM | Admit: 2017-10-24 | Discharge: 2017-10-24 | Disposition: A | Discharge status: Home / Self Care | Payer: Medicare HMO | Attending: Emergency Medicine | Admitting: Emergency Medicine

## 2017-10-24 DIAGNOSIS — E119 Type 2 diabetes mellitus without complications: Secondary | ICD-10-CM | POA: Diagnosis not present

## 2017-10-24 DIAGNOSIS — Z91010 Allergy to peanuts: Secondary | ICD-10-CM | POA: Insufficient documentation

## 2017-10-24 DIAGNOSIS — Z7984 Long term (current) use of oral hypoglycemic drugs: Secondary | ICD-10-CM | POA: Insufficient documentation

## 2017-10-24 DIAGNOSIS — I110 Hypertensive heart disease with heart failure: Secondary | ICD-10-CM | POA: Insufficient documentation

## 2017-10-24 DIAGNOSIS — M5136 Other intervertebral disc degeneration, lumbar region: Secondary | ICD-10-CM | POA: Diagnosis not present

## 2017-10-24 DIAGNOSIS — Z79899 Other long term (current) drug therapy: Secondary | ICD-10-CM | POA: Insufficient documentation

## 2017-10-24 DIAGNOSIS — Z87891 Personal history of nicotine dependence: Secondary | ICD-10-CM | POA: Diagnosis not present

## 2017-10-24 DIAGNOSIS — M545 Low back pain: Secondary | ICD-10-CM | POA: Diagnosis present

## 2017-10-24 DIAGNOSIS — M25551 Pain in right hip: Secondary | ICD-10-CM | POA: Insufficient documentation

## 2017-10-24 DIAGNOSIS — I509 Heart failure, unspecified: Secondary | ICD-10-CM | POA: Insufficient documentation

## 2017-10-24 MED ORDER — TRAMADOL HCL 50 MG PO TABS
50.0000 mg | ORAL_TABLET | Freq: Once | ORAL | Status: AC
  Administered 2017-10-24: 50 mg via ORAL
  Filled 2017-10-24: qty 1

## 2017-10-24 MED ORDER — TRAMADOL HCL 50 MG PO TABS: 50.0000 mg | ORAL_TABLET | Freq: Four times a day (QID) | ORAL | 0 refills | Status: DC | PRN

## 2017-10-24 NOTE — ED Provider Notes (Signed)
[facility] Emergency Department Provider Note    First MD Initiated Contact with Patient 10/24/17 0144     (approximate)  I have reviewed the triage vital signs and the nursing notes.   HISTORY  Chief Complaint Hip Pain    HPI Virginia Barnes is a 71 y.o. female with below list of chronic medical conditions presents to the emergency department low back, "tailbone" and right hip pain status post fall approximately 1 month ago.  Patient states that on awakening yesterday morning pain acutely worsened which prompted her visit to the emergency department tonight.  Patient states her current pain score 7 out of 10 worse with any movement.   Past Medical History:  Diagnosis Date  . CHF (congestive heart failure) (HCC)   . Diabetes mellitus without complication (HCC)   . Diverticulitis   . Hypertension     Patient Active Problem List   Diagnosis Date Noted  . Knee pain 05/17/2017    Past Surgical History:  Procedure Laterality Date  . ABDOMINAL HYSTERECTOMY    . CARDIAC CATHETERIZATION Left 04/13/2016   Procedure: Left Heart Cath and Coronary Angiography;  Surgeon: Laurier NancyShaukat A Khan, MD;  Location: [facility] INVASIVE CV LAB;  Service: Cardiovascular;  Laterality: Left;  . JOINT REPLACEMENT      Prior to Admission medications   Medication Sig Start Date End Date Taking? Authorizing Provider  ACCU-CHEK AVIVA PLUS test strip  12/26/16   [provider]  acetaminophen (TYLENOL) 500 MG tablet Take by mouth.    [provider]  amLODipine (NORVASC) 5 MG tablet  11/09/16   [provider]  ASPIRIN LOW DOSE 81 MG EC tablet Take 1 tablet by mouth daily. 09/26/15   [provider]  ASSURE COMFORT LANCETS 30G MISC  12/26/16   [provider]  Blood Glucose Calibration (ACCU-CHEK AVIVA) SOLN  12/26/16   [provider]  calcium carbonate (CALCIUM-CARB 600) 600 MG TABS tablet Take by mouth.    [provider]    fluconazole (DIFLUCAN) 150 MG tablet  11/16/16   [provider]  FLUZONE HIGH-DOSE 0.5 ML SUSY  12/13/16   [provider]  hydrALAZINE (APRESOLINE) 50 MG tablet Take 1 tablet by mouth 2 (two) times daily. 11/04/15   [provider]  hydrochlorothiazide (HYDRODIURIL) 25 MG tablet Take 1 tablet by mouth daily. 11/04/15   [provider]  losartan (COZAAR) 25 MG tablet Take 1 tablet by mouth daily. 11/04/15   [provider]  metFORMIN (GLUCOPHAGE) 500 MG tablet Take 500 mg by mouth 2 (two) times daily with a meal.    [provider]  metoprolol (LOPRESSOR) 50 MG tablet Take by mouth.    [provider]  metoprolol succinate (TOPROL-XL) 100 MG 24 hr tablet Take 100 mg by mouth daily. Take with or immediately following a meal.    [provider]  Na Sulfate-K Sulfate-Mg Sulf 17.5-3.13-1.6 GM/180ML SOLN Take by mouth. 01/06/15   [provider]  naproxen (NAPROSYN) 500 MG tablet Take by mouth.    [provider]  omeprazole (PRILOSEC) 40 MG capsule Take 1 capsule by mouth daily. 09/26/15   [provider]  potassium chloride (K-DUR) 10 MEQ tablet Take by mouth.    [provider]  potassium chloride (K-DUR,KLOR-CON) 10 MEQ tablet Take 1 tablet by mouth daily. 09/26/15   [provider]  rosuvastatin (CRESTOR) 20 MG tablet Take 20 mg by mouth daily.    [provider]  sertraline (ZOLOFT) 100 MG tablet Take 100 mg by mouth daily.    [provider]  sotalol (BETAPACE) 80 MG tablet  10/31/16   [provider]    Allergies Peanuts [peanut oil]; Lasix [furosemide]; and Lisinopril  Family History  Problem Relation Age of Onset  . Stomach cancer Sister 82  . Vaginal cancer Sister 36  . Breast cancer Neg Hx     Social History Social History   Tobacco Use  . Smoking status: Former Games developer  . Smokeless tobacco: Never Used  Substance Use Topics  . Alcohol  use: No  . Drug use: Not on file    Review of Systems Constitutional: No fever/chills Eyes: No visual changes. ENT: No sore throat. Cardiovascular: Denies chest pain. Respiratory: Denies shortness of breath. Gastrointestinal: No abdominal pain.  No nausea, no vomiting.  No diarrhea.  No constipation. Genitourinary: Negative for dysuria. Musculoskeletal: Negative for neck pain.  Positive for back pain.  Positive "tailbone" and right hip pain Integumentary: Negative for rash. Neurological: Negative for headaches, focal weakness or numbness.   ____________________________________________   PHYSICAL EXAM:  VITAL SIGNS: ED Triage Vitals  Enc Vitals Group     BP 10/24/17 0140 132/61     Pulse Rate 10/24/17 0140 67     Resp 10/24/17 0140 18     Temp 10/24/17 0140 98 F (36.7 C)     Temp Source 10/24/17 0140 Oral     SpO2 10/24/17 0140 98 %     Weight 10/24/17 0140 93.9 kg (207 lb)     Height 10/24/17 0140 1.753 m (5\' 9" )     Head Circumference --      Peak Flow --      Pain Score 10/24/17 0139 8     Pain Loc --      Pain Edu? --      Excl. in GC? --     Constitutional: Alert and oriented. Well appearing and in no acute distress. Eyes: Conjunctivae are normal.  Head: Atraumatic. Mouth/Throat: Mucous membranes are moist. Neck: No stridor.  No cervical spine tenderness to palpation. Cardiovascular: Normal rate, regular rhythm. Good peripheral circulation. Grossly normal heart sounds. Respiratory: Normal respiratory effort.  No retractions. Lungs CTAB. Gastrointestinal: Soft and nontender. No distention.  Musculoskeletal: No lower extremity tenderness nor edema. No gross deformities of extremities.  Pain with lumbosacral palpation Neurologic:  Normal speech and language. No gross focal neurologic deficits are appreciated.  Skin:  Skin is warm, dry and intact. No rash noted. Psychiatric: Mood and affect are normal. Speech and behavior are normal.    RADIOLOGY I, Tucker  N Kris Burd, personally viewed and evaluated these images (plain radiographs) as part of my medical decision making, as well as reviewing the written report by the radiologist.  Dg Lumbar Spine Complete  Result Date: 10/24/2017 CLINICAL DATA:  Progressively worsening right hip pain after a fall 1 month ago. Pain became severe yesterday. Tail bone pain. EXAM: SACRUM AND COCCYX - 2+ VIEW; LUMBAR SPINE - COMPLETE 4+ VIEW COMPARISON:  CT abdomen and pelvis 11/21/2015 FINDINGS: Views of the lumbar spine and sacral coccygeal spine are obtained. There is a generator pack with lead tip over the left lower sacrum. Normal alignment of the lumbar vertebrae. Degenerative changes with narrowed interspaces and endplate hypertrophic changes. Degenerative disc disease at L2-3 and L3-4 levels. No vertebral compression deformities. No focal bone lesion. No bone destruction. SI joints are not displaced. Sacral struts appear symmetrical. No depressed sacral  fractures identified. Calcification of the aorta. IMPRESSION: No acute fracture or dislocation demonstrated in the lumbar spine and sacral coccygeal spine. Degenerative changes. Electronically Signed   By: Burman NievesWilliam  Stevens M.D.   On: 10/24/2017 02:44   Dg Sacrum/coccyx  Result Date: 10/24/2017 CLINICAL DATA:  Progressively worsening right hip pain after a fall 1 month ago. Pain became severe yesterday. Tail bone pain. EXAM: SACRUM AND COCCYX - 2+ VIEW; LUMBAR SPINE - COMPLETE 4+ VIEW COMPARISON:  CT abdomen and pelvis 11/21/2015 FINDINGS: Views of the lumbar spine and sacral coccygeal spine are obtained. There is a generator pack with lead tip over the left lower sacrum. Normal alignment of the lumbar vertebrae. Degenerative changes with narrowed interspaces and endplate hypertrophic changes. Degenerative disc disease at L2-3 and L3-4 levels. No vertebral compression deformities. No focal bone lesion. No bone destruction. SI joints are not displaced. Sacral struts appear  symmetrical. No depressed sacral fractures identified. Calcification of the aorta. IMPRESSION: No acute fracture or dislocation demonstrated in the lumbar spine and sacral coccygeal spine. Degenerative changes. Electronically Signed   By: Burman NievesWilliam  Stevens M.D.   On: 10/24/2017 02:44   Dg Hip Unilat  With Pelvis 2-3 Views Right  Result Date: 10/24/2017 CLINICAL DATA:  Progressive right hip pain, severe yesterday. Fall 1 month prior. EXAM: DG HIP (WITH OR WITHOUT PELVIS) 2-3V RIGHT COMPARISON:  Radiographs 11/27/2013 FINDINGS: The cortical margins of the bony pelvis and right hip are intact. No fracture. Pubic symphysis and sacroiliac joints are congruent. Both femoral heads are well-seated in the respective acetabula. Chondrocalcinosis noted of the pubic symphysis. Sacral stimulator. IMPRESSION: No fracture or subluxation of the pelvis or right hip. Electronically Signed   By: Rubye OaksMelanie  Ehinger M.D.   On: 10/24/2017 02:12    Procedures   ____________________________________________   INITIAL IMPRESSION / ASSESSMENT AND PLAN / ED COURSE  As part of my medical decision making, I reviewed the following data within the electronic MEDICAL RECORD [id number] year old female present with above-stated history of accidental fall 1 month ago with progressive lumbosacral pain as well as right hip pain.  X-rays performed reveal no evidence of fracture or dislocation.  However x-rays consistent with degenerative disc disease.  Patient given tramadol in the emergency department will be prescribed the same for home.  Patient advised to follow-up with primary care provider ____________________________________________  FINAL CLINICAL IMPRESSION(S) / ED DIAGNOSES  Final diagnoses:  Degenerative disc disease, lumbar     MEDICATIONS GIVEN DURING THIS VISIT:  Medications  traMADol (ULTRAM) tablet 50 mg (50 mg Oral Given 10/24/17 0237)     ED Discharge Orders    None       Note:  This document was prepared  using Dragon voice recognition software and may include unintentional dictation errors.    Darci CurrentBrown, [place] N, MD 10/24/17 [phone]

## 2017-10-24 NOTE — ED Notes (Signed)
Pt. Verbalizes understanding of discharge instructions.

## 2017-10-24 NOTE — ED Triage Notes (Signed)
Pt arrived via ems from home with complaints of progressively worsening right hip pain from a fall one month ago. Pt reports the pain became severe yesterday. Upon arrival pt is alert and oriented x 4 and in no apparent distress. No rotation or deformity noticed. Pt able to move leg independently.

## 2018-01-11 ENCOUNTER — Emergency Department: Payer: Medicare HMO

## 2018-01-11 ENCOUNTER — Other Ambulatory Visit: Payer: Self-pay

## 2018-01-11 ENCOUNTER — Emergency Department
Admission: EM | Admit: 2018-01-11 | Discharge: 2018-01-11 | Disposition: A | Discharge status: Home / Self Care | Payer: Medicare HMO | Attending: Emergency Medicine | Admitting: Emergency Medicine

## 2018-01-11 DIAGNOSIS — Z79899 Other long term (current) drug therapy: Secondary | ICD-10-CM | POA: Diagnosis not present

## 2018-01-11 DIAGNOSIS — Z7982 Long term (current) use of aspirin: Secondary | ICD-10-CM | POA: Insufficient documentation

## 2018-01-11 DIAGNOSIS — Y9389 Activity, other specified: Secondary | ICD-10-CM | POA: Insufficient documentation

## 2018-01-11 DIAGNOSIS — Z87891 Personal history of nicotine dependence: Secondary | ICD-10-CM | POA: Insufficient documentation

## 2018-01-11 DIAGNOSIS — W19XXXA Unspecified fall, initial encounter: Secondary | ICD-10-CM

## 2018-01-11 DIAGNOSIS — Y999 Unspecified external cause status: Secondary | ICD-10-CM | POA: Insufficient documentation

## 2018-01-11 DIAGNOSIS — W010XXA Fall on same level from slipping, tripping and stumbling without subsequent striking against object, initial encounter: Secondary | ICD-10-CM | POA: Diagnosis not present

## 2018-01-11 DIAGNOSIS — Z91010 Allergy to peanuts: Secondary | ICD-10-CM | POA: Insufficient documentation

## 2018-01-11 DIAGNOSIS — E119 Type 2 diabetes mellitus without complications: Secondary | ICD-10-CM | POA: Diagnosis not present

## 2018-01-11 DIAGNOSIS — I509 Heart failure, unspecified: Secondary | ICD-10-CM | POA: Insufficient documentation

## 2018-01-11 DIAGNOSIS — Z7984 Long term (current) use of oral hypoglycemic drugs: Secondary | ICD-10-CM | POA: Insufficient documentation

## 2018-01-11 DIAGNOSIS — S0990XA Unspecified injury of head, initial encounter: Secondary | ICD-10-CM | POA: Diagnosis not present

## 2018-01-11 DIAGNOSIS — Y929 Unspecified place or not applicable: Secondary | ICD-10-CM | POA: Insufficient documentation

## 2018-01-11 DIAGNOSIS — I110 Hypertensive heart disease with heart failure: Secondary | ICD-10-CM | POA: Insufficient documentation

## 2018-01-11 DIAGNOSIS — E162 Hypoglycemia, unspecified: Secondary | ICD-10-CM | POA: Insufficient documentation

## 2018-01-11 LAB — URINALYSIS, COMPLETE (UACMP) WITH MICROSCOPIC
BACTERIA UA: NONE SEEN
Bilirubin Urine: NEGATIVE
Glucose, UA: 150 mg/dL — AB
HGB URINE DIPSTICK: NEGATIVE
Ketones, ur: NEGATIVE mg/dL
Leukocytes, UA: NEGATIVE
NITRITE: NEGATIVE
PROTEIN: NEGATIVE mg/dL
SPECIFIC GRAVITY, URINE: 1.01 (ref 1.005–1.030)
pH: 7 (ref 5.0–8.0)

## 2018-01-11 LAB — GLUCOSE, CAPILLARY
GLUCOSE-CAPILLARY: 22 mg/dL — AB (ref 65–99)
GLUCOSE-CAPILLARY: 183 mg/dL — AB (ref 65–99)
Glucose-Capillary: 78 mg/dL (ref 65–99)
Glucose-Capillary: 98 mg/dL (ref 65–99)
Glucose-Capillary: 50 mg/dL — ABNORMAL LOW (ref 65–99)
Glucose-Capillary: 74 mg/dL (ref 65–99)
Glucose-Capillary: 165 mg/dL — ABNORMAL HIGH (ref 65–99)
Glucose-Capillary: 54 mg/dL — ABNORMAL LOW (ref 65–99)
Glucose-Capillary: 185 mg/dL — ABNORMAL HIGH (ref 65–99)
Glucose-Capillary: 103 mg/dL — ABNORMAL HIGH (ref 65–99)
Glucose-Capillary: 119 mg/dL — ABNORMAL HIGH (ref 65–99)
Glucose-Capillary: 114 mg/dL — ABNORMAL HIGH (ref 65–99)

## 2018-01-11 LAB — COMPREHENSIVE METABOLIC PANEL
ALK PHOS: 65 U/L (ref 38–126)
ALT: 23 U/L (ref 14–54)
AST: 31 U/L (ref 15–41)
Albumin: 3.8 g/dL (ref 3.5–5.0)
Anion gap: 11 (ref 5–15)
BUN: 13 mg/dL (ref 6–20)
CALCIUM: 9.4 mg/dL (ref 8.9–10.3)
CHLORIDE: 101 mmol/L (ref 101–111)
CO2: 29 mmol/L (ref 22–32)
CREATININE: 1.05 mg/dL — AB (ref 0.44–1.00)
GFR calc non Af Amer: 52 mL/min — ABNORMAL LOW (ref >60)
GFR, EST AFRICAN AMERICAN: 60 mL/min — AB (ref >60)
GLUCOSE: 54 mg/dL — AB (ref 65–99)
Potassium: 3.6 mmol/L (ref 3.5–5.1)
SODIUM: 141 mmol/L (ref 135–145)
Total Bilirubin: 0.6 mg/dL (ref 0.3–1.2)
Total Protein: 7.5 g/dL (ref 6.5–8.1)

## 2018-01-11 LAB — TROPONIN I
Troponin I: 0.04 ng/mL (ref <0.03)
Troponin I: 0.03 ng/mL (ref <0.03)

## 2018-01-11 LAB — CBC WITH DIFFERENTIAL/PLATELET
BASOS PCT: 0 %
Basophils Absolute: 0 10*3/uL (ref 0–0.1)
EOS ABS: 0 10*3/uL (ref 0–0.7)
Eosinophils Relative: 0 %
HCT: 42.8 % (ref 35.0–47.0)
Hemoglobin: 13.6 g/dL (ref 12.0–16.0)
Lymphocytes Relative: 10 %
Lymphs Abs: 0.5 10*3/uL — ABNORMAL LOW (ref 1.0–3.6)
MCH: 27.7 pg (ref 26.0–34.0)
MCHC: 31.7 g/dL — AB (ref 32.0–36.0)
MCV: 87.3 fL (ref 80.0–100.0)
MONO ABS: 0.4 10*3/uL (ref 0.2–0.9)
MONOS PCT: 9 %
Neutro Abs: 4.1 10*3/uL (ref 1.4–6.5)
Neutrophils Relative %: 81 %
Platelets: 224 10*3/uL (ref 150–440)
RBC: 4.9 MIL/uL (ref 3.80–5.20)
RDW: 14.3 % (ref 11.5–14.5)
WBC: 5 10*3/uL (ref 3.6–11.0)

## 2018-01-11 MED ORDER — DEXTROSE 50 % IV SOLN
1.0000 | Freq: Once | INTRAVENOUS | Status: AC
  Administered 2018-01-11: 50 mL via INTRAVENOUS

## 2018-01-11 MED ORDER — DIPHENHYDRAMINE HCL 50 MG/ML IJ SOLN
INTRAMUSCULAR | Status: AC
  Administered 2018-01-11: 12.5 mg via INTRAVENOUS
  Filled 2018-01-11: qty 1

## 2018-01-11 MED ORDER — SODIUM CHLORIDE 4 MEQ/ML IV SOLN: INTRAVENOUS | Status: DC

## 2018-01-11 MED ORDER — DEXTROSE 50 % IV SOLN
INTRAVENOUS | Status: AC
  Administered 2018-01-11: 50 mL
  Filled 2018-01-11: qty 50

## 2018-01-11 MED ORDER — DIPHENHYDRAMINE HCL 50 MG/ML IJ SOLN
12.5000 mg | INTRAMUSCULAR | Status: AC
Start: 2018-01-11 — End: 2018-01-11
  Administered 2018-01-11: 12.5 mg via INTRAVENOUS

## 2018-01-11 MED ORDER — DEXTROSE 50 % IV SOLN
INTRAVENOUS | Status: AC
  Administered 2018-01-11: 50 mL via INTRAVENOUS
  Filled 2018-01-11: qty 50

## 2018-01-11 NOTE — ED Provider Notes (Addendum)
[facility] Emergency Department Provider Note  ____________________________________________   I have reviewed the triage vital signs and the nursing notes. Where available I have reviewed prior notes and, if possible and indicated, outside hospital notes.    HISTORY  Chief Complaint Fall (Witnessed, no LOC, no head contact)    HPI Virginia Barnes is a 72 y.o. female with a history of diabetes mellitus, who apparently is being taken off of her medications for low sugar she states woke up this morning, fell twice.  May have hit her head the second time.  Did not pass out.  States she  "just fell is all".  She has no evidence of injury.  Blood sugar was 60 for EMS.  Is not clear if the facility checked her sugar before sending her in.  Upon arrival here was 22.  Patient states she has a history of low blood sugar of unexplained origin and this is why they are having to take her off her diabetes medications.  She did not have anything to eat or drink this morning before being transported here after her falls.  She is talking on the phone initially and has to be asked to stop during our exam.      Past Medical History:  Diagnosis Date  . CHF (congestive heart failure) (HCC)   . Diabetes mellitus without complication (HCC)   . Diverticulitis   . Hypertension     Patient Active Problem List   Diagnosis Date Noted  . Knee pain 05/17/2017    Past Surgical History:  Procedure Laterality Date  . ABDOMINAL HYSTERECTOMY    . CARDIAC CATHETERIZATION Left 04/13/2016   Procedure: Left Heart Cath and Coronary Angiography;  Surgeon: Laurier Nancy, MD;  Location: [facility] INVASIVE CV LAB;  Service: Cardiovascular;  Laterality: Left;  . JOINT REPLACEMENT      Prior to Admission medications   Medication Sig Start Date End Date Taking? Authorizing Provider  ACCU-CHEK AVIVA PLUS test strip  12/26/16   [provider]  acetaminophen (TYLENOL) 500 MG tablet Take by  mouth.    [provider]  amLODipine (NORVASC) 5 MG tablet  11/09/16   [provider]  ASPIRIN LOW DOSE 81 MG EC tablet Take 1 tablet by mouth daily. 09/26/15   [provider]  ASSURE COMFORT LANCETS 30G MISC  12/26/16   [provider]  Blood Glucose Calibration (ACCU-CHEK AVIVA) SOLN  12/26/16   [provider]  calcium carbonate (CALCIUM-CARB 600) 600 MG TABS tablet Take by mouth.    [provider]  fluconazole (DIFLUCAN) 150 MG tablet  11/16/16   [provider]  FLUZONE HIGH-DOSE 0.5 ML SUSY  12/13/16   [provider]  hydrALAZINE (APRESOLINE) 50 MG tablet Take 1 tablet by mouth 2 (two) times daily. 11/04/15   [provider]  hydrochlorothiazide (HYDRODIURIL) 25 MG tablet Take 1 tablet by mouth daily. 11/04/15   [provider]  losartan (COZAAR) 25 MG tablet Take 1 tablet by mouth daily. 11/04/15   [provider]  metFORMIN (GLUCOPHAGE) 500 MG tablet Take 500 mg by mouth 2 (two) times daily with a meal.    [provider]  metoprolol (LOPRESSOR) 50 MG tablet Take by mouth.    [provider]  metoprolol succinate (TOPROL-XL) 100 MG 24 hr tablet Take 100 mg by mouth daily. Take with or immediately following a meal.    [provider]  Na Sulfate-K Sulfate-Mg Sulf 17.5-3.13-1.6 GM/180ML  SOLN Take by mouth. 01/06/15   [provider]  naproxen (NAPROSYN) 500 MG tablet Take by mouth.    [provider]  omeprazole (PRILOSEC) 40 MG capsule Take 1 capsule by mouth daily. 09/26/15   [provider]  potassium chloride (K-DUR) 10 MEQ tablet Take by mouth.    [provider]  potassium chloride (K-DUR,KLOR-CON) 10 MEQ tablet Take 1 tablet by mouth daily. 09/26/15   [provider]  rosuvastatin (CRESTOR) 20 MG tablet Take 20 mg by mouth daily.    [provider]  sertraline (ZOLOFT) 100 MG tablet Take 100 mg by mouth daily.     [provider]  sotalol (BETAPACE) 80 MG tablet  10/31/16   [provider]  traMADol (ULTRAM) 50 MG tablet Take 1 tablet (50 mg total) by mouth every 6 (six) hours as needed. 10/24/17 10/24/18  Darci Current, MD    Allergies Peanuts [peanut oil]; Lasix [furosemide]; and Lisinopril  Family History  Problem Relation Age of Onset  . Stomach cancer Sister 64  . Vaginal cancer Sister 1  . Breast cancer Neg Hx     Social History Social History   Tobacco Use  . Smoking status: Former Games developer  . Smokeless tobacco: Never Used  Substance Use Topics  . Alcohol use: No  . Drug use: Not on file    Review of Systems Constitutional: No fever/chills Eyes: No visual changes. ENT: No sore throat. No stiff neck no neck pain Cardiovascular: Denies chest pain. Respiratory: Denies shortness of breath. Gastrointestinal:   no vomiting.  No diarrhea.  No constipation. Genitourinary: Negative for dysuria. Musculoskeletal: Negative lower extremity swelling Skin: Negative for rash. Neurological: Negative for severe headaches, focal weakness or numbness.   ____________________________________________   PHYSICAL EXAM:  VITAL SIGNS: ED Triage Vitals  Enc Vitals Group     BP 01/11/18 0742 (!) 158/69     Pulse Rate 01/11/18 0742 (!) 59     Resp --      Temp 01/11/18 0742 97.7 F (36.5 C)     Temp src --      SpO2 01/11/18 0742 100 %     Weight 01/11/18 0743 229 lb 4.8 oz (104 kg)     Height 01/11/18 0743 5\' 9"  (1.753 m)     Head Circumference --      Peak Flow --      Pain Score --      Pain Loc --      Pain Edu? --      Excl. in GC? --     Constitutional: Alert and oriented. Well appearing and in no acute distress. Eyes: Conjunctivae are normal Head: Atraumatic HEENT: No congestion/rhinnorhea. Mucous membranes are moist.  Oropharynx non-erythematous Neck:   Nontender with no meningismus, no masses, no stridor Cardiovascular: Normal rate, regular rhythm.  Grossly normal heart sounds.  Good peripheral circulation. Respiratory: Normal respiratory effort.  No retractions. Lungs CTAB. Abdominal: Soft and nontender. No distention. No guarding no rebound Back:  There is no focal tenderness or step off.  there is no midline tenderness there are no lesions noted. there is no CVA tenderness Musculoskeletal: No lower extremity tenderness, no upper extremity tenderness. No joint effusions, no DVT signs strong distal pulses no edema Neurologic:  Normal speech and language. No gross focal neurologic deficits are appreciated.  Skin:  Skin is warm, dry and intact. No rash noted. Psychiatric: Mood and affect are normal. Speech and behavior are normal.  ____________________________________________  LABS (all labs ordered are listed, but only abnormal results are displayed)  Labs Reviewed  GLUCOSE, CAPILLARY - Abnormal; Notable for the following components:      Result Value   Glucose-Capillary 22 (*)    All other components within normal limits  GLUCOSE, CAPILLARY  CBC WITH DIFFERENTIAL/PLATELET  URINALYSIS, COMPLETE (UACMP) WITH MICROSCOPIC  COMPREHENSIVE METABOLIC PANEL  TROPONIN I    Pertinent labs  results that were available during my care of the patient were reviewed by me and considered in my medical decision making (see chart for details). ____________________________________________  EKG  I personally interpreted any EKGs ordered by me or triage Sinus rhythm rate 61 bpm, normal axis, nonspecific ST changes no acute ischemia ____________________________________________  RADIOLOGY  Pertinent labs & imaging results that were available during my care of the patient were reviewed by me and considered in my medical decision making (see chart for details). If possible, patient and/or family made aware of any abnormal findings.  No results found. ____________________________________________    PROCEDURES  Procedure(s) performed:  None  Procedures  Critical Care performed: None  ____________________________________________   INITIAL IMPRESSION / ASSESSMENT AND PLAN / ED COURSE  Pertinent labs & imaging results that were available during my care of the patient were reviewed by me and considered in my medical decision making (see chart for details).  Patient here after 2 non-syncopal falls for feeling weak this morning.  Blood sugar was 60 when she was picked up by EMS and per report, here it was 22, we gave her orange juice and D50 if sugars in the 100 she has no complaints at this time.  Unclear if this is part of a greater disease process, will check basic blood work and EKG I will image her head given the report of possible head injury, at her age.  We will check urinalysis etc.  However, most of this seems likely related to what appears to be a chronic recurrent hypoglycemic issue which has been corrected thus far with orange juice.  This is certainly something a skilled nursing home to be able to take care of, if no other acute issues are noted  ----------------------------------------- 9:45 AM on 01/11/2018 -----------------------------------------  Blood sugar back down into the 50s, patient awake and alert, we are feeding her, unclear why she has persistent recurrent hypoglycemia, I did talk to nursing home, [facility], who states that patient was not getting any diabetes medications.  She does not believe she likely got someone else's diabetes medication either.  In any event, we are monitoring the patient closely here she is without complaint except for her sugar keeps dropping, we are giving her p.o.,  ----------------------------------------- 2:04 PM on 01/11/2018 -----------------------------------------  Pt has been to [employer] eating and drinking, she is using her cell phone in no acute distress, has no complaints or symptoms workup is negative aside from her shoulder persistent low blood sugar.  She is not  on any blood sugar medications.  I have advised the nursing home to check very carefully her med list to make sure that she is not accidentally getting someone else's oral hypoglycemic agents or that there is nothing else going on.  There is no other acute pathology noted.  Patient is eager to go home we will discharge her with close outpatient follow-up and may have advised the nursing home to maintain close and vigilant watch on her sugars.  Her sugars have been f normal range since 930.  ----------------------------------------- 2:54 PM on 01/11/2018 -----------------------------------------  Patient usually walks with a walker, able to walk here, discussed with Leverne Humbles, her son.  Who will come pick her up, he to discharge and will work in close [employer] with nursing home with whom I have already spoken to ensure that they are vigilant in checking her sugars and that she is getting the appropriate medications and they will follow closely as an outpatient.  Precautions and follow-up given and understood. ____________________________________________   FINAL CLINICAL IMPRESSION(S) / ED DIAGNOSES  Final diagnoses:  None      This chart was dictated using voice recognition software.  Despite best efforts to proofread,  errors can occur which can change meaning.      Jeanmarie Plant, MD 01/11/18 1610    Jeanmarie Plant, MD 01/11/18 9604    Jeanmarie Plant, MD 01/11/18 1011    Jeanmarie Plant, MD 01/11/18 1405    Jeanmarie Plant, MD 01/11/18 [phone]

## 2018-01-11 NOTE — ED Notes (Signed)
Asked pt if she can eat peanuts and graham crackers, pt stated yes.  Drank 2 OJ, 1 graham cracker and peanut butter and regular diet tray ordered.

## 2018-01-11 NOTE — ED Notes (Signed)
Spoke with staff from [facility] instructions given over the phone. SHe states they have order to check patient's cbg once a day and would need an order to do more often.  Also needs an order to not give pt amaryl or metformin.  Dr Alphonzo LemmingsMcSHane informed. Dc instructions modified.  And highlighted for staff.  WIll need to follow up with pt's pmd this week as well.  Verbalizes understanding.  Waiting for pt's ride.

## 2018-01-11 NOTE — ED Notes (Signed)
Given 1 4 oz orange juice while establishing IV

## 2018-01-11 NOTE — ED Triage Notes (Signed)
Patient A & O x 4 CBG 61 by EMS, fell twice before breakfast, no LOC. Fell on left side. Reports no pain or injuries

## 2018-01-11 NOTE — ED Notes (Signed)
Administered second 4 oz orange juice.

## 2018-01-11 NOTE — ED Notes (Signed)
Rechecked cbg. 50. Dr Neta MendsMc Shane informed. Give her food and more OJ. Pt is awake, A&O. Just a little more sleepy acting.

## 2018-01-11 NOTE — ED Notes (Signed)
Dc instructions given to pt and her son Virginia Barnes.  Verbalizes understanding. Dc home

## 2018-01-11 NOTE — ED Notes (Signed)
Dr Alphonzo LemmingsMcShane requested one more cbg prior to dc.  114.  Reported to him.  Cleared for discharge.

## 2018-01-11 NOTE — ED Notes (Signed)
Dr Alphonzo LemmingsMcshane informed of repeat cbg 54. Verbal order for amp d50

## 2018-01-11 NOTE — ED Notes (Signed)
Date and time results received: 01/11/18 1006 (use smartphrase ".now" to insert current time)  Test: Troponin Critical Value: 0.04  Name of Provider Notified: Dr. Alphonzo LemmingsMcShane  Orders Received? Or Actions Taken?: notified

## 2018-01-11 NOTE — Discharge Instructions (Signed)
Please be sure to check her blood sugar every 2 hours while she is there.  If she is acting in any way unusual please check her blood sugar right away.  Her blood sugar was low for some time here it is unclear why.  Usually that is because of medication.  Please be very vigilant and giving her medication to make sure that she is not either accidentally taking an old low sugar medication or there is some other possible error in her medications.  She should not be taking any diabetes medications at this time it appears.  Please call her doctor and have her seen as soon as possible.  If there are any new or worrisome concerns please return to the emergency department

## 2018-01-11 NOTE — ED Notes (Signed)
ED provider notified about peanut allergy and patient consumed 1 tub peanut butter. ED provider stated give 12.5 mg benadryl IV

## 2018-01-12 ENCOUNTER — Encounter: Payer: Self-pay | Admitting: Emergency Medicine

## 2018-01-12 ENCOUNTER — Observation Stay
Admission: EM | Admit: 2018-01-12 | Discharge: 2018-01-13 | Disposition: A | Discharge status: Home / Self Care | Payer: Medicare HMO | Attending: Specialist | Admitting: Specialist

## 2018-01-12 ENCOUNTER — Other Ambulatory Visit: Payer: Self-pay

## 2018-01-12 DIAGNOSIS — K219 Gastro-esophageal reflux disease without esophagitis: Secondary | ICD-10-CM | POA: Diagnosis not present

## 2018-01-12 DIAGNOSIS — Z91010 Allergy to peanuts: Secondary | ICD-10-CM | POA: Insufficient documentation

## 2018-01-12 DIAGNOSIS — S0990XA Unspecified injury of head, initial encounter: Secondary | ICD-10-CM | POA: Diagnosis not present

## 2018-01-12 DIAGNOSIS — E11649 Type 2 diabetes mellitus with hypoglycemia without coma: Principal | ICD-10-CM | POA: Insufficient documentation

## 2018-01-12 DIAGNOSIS — W19XXXA Unspecified fall, initial encounter: Secondary | ICD-10-CM | POA: Insufficient documentation

## 2018-01-12 DIAGNOSIS — Z8049 Family history of malignant neoplasm of other genital organs: Secondary | ICD-10-CM | POA: Insufficient documentation

## 2018-01-12 DIAGNOSIS — F329 Major depressive disorder, single episode, unspecified: Secondary | ICD-10-CM | POA: Insufficient documentation

## 2018-01-12 DIAGNOSIS — Z7982 Long term (current) use of aspirin: Secondary | ICD-10-CM | POA: Diagnosis not present

## 2018-01-12 DIAGNOSIS — Z8249 Family history of ischemic heart disease and other diseases of the circulatory system: Secondary | ICD-10-CM | POA: Diagnosis not present

## 2018-01-12 DIAGNOSIS — Z800 Family history of malignant neoplasm of digestive organs: Secondary | ICD-10-CM | POA: Diagnosis not present

## 2018-01-12 DIAGNOSIS — Z87891 Personal history of nicotine dependence: Secondary | ICD-10-CM | POA: Diagnosis not present

## 2018-01-12 DIAGNOSIS — I480 Paroxysmal atrial fibrillation: Secondary | ICD-10-CM | POA: Insufficient documentation

## 2018-01-12 DIAGNOSIS — Z79899 Other long term (current) drug therapy: Secondary | ICD-10-CM | POA: Insufficient documentation

## 2018-01-12 DIAGNOSIS — Z888 Allergy status to other drugs, medicaments and biological substances status: Secondary | ICD-10-CM | POA: Insufficient documentation

## 2018-01-12 DIAGNOSIS — Z7984 Long term (current) use of oral hypoglycemic drugs: Secondary | ICD-10-CM | POA: Insufficient documentation

## 2018-01-12 DIAGNOSIS — Z9181 History of falling: Secondary | ICD-10-CM | POA: Insufficient documentation

## 2018-01-12 DIAGNOSIS — I110 Hypertensive heart disease with heart failure: Secondary | ICD-10-CM | POA: Insufficient documentation

## 2018-01-12 DIAGNOSIS — I509 Heart failure, unspecified: Secondary | ICD-10-CM | POA: Insufficient documentation

## 2018-01-12 DIAGNOSIS — E162 Hypoglycemia, unspecified: Secondary | ICD-10-CM | POA: Diagnosis present

## 2018-01-12 DIAGNOSIS — R531 Weakness: Secondary | ICD-10-CM | POA: Diagnosis not present

## 2018-01-12 DIAGNOSIS — E785 Hyperlipidemia, unspecified: Secondary | ICD-10-CM | POA: Diagnosis not present

## 2018-01-12 DIAGNOSIS — E119 Type 2 diabetes mellitus without complications: Secondary | ICD-10-CM

## 2018-01-12 LAB — CBC WITH DIFFERENTIAL/PLATELET
Basophils Absolute: 0.1 10*3/uL (ref 0–0.1)
Basophils Relative: 1 %
Eosinophils Absolute: 0 10*3/uL (ref 0–0.7)
Eosinophils Relative: 1 %
HEMATOCRIT: 37.9 % (ref 35.0–47.0)
HEMOGLOBIN: 12 g/dL (ref 12.0–16.0)
LYMPHS ABS: 0.8 10*3/uL — AB (ref 1.0–3.6)
Lymphocytes Relative: 18 %
MCH: 27.7 pg (ref 26.0–34.0)
MCHC: 31.7 g/dL — AB (ref 32.0–36.0)
MCV: 87.5 fL (ref 80.0–100.0)
Monocytes Absolute: 0.5 10*3/uL (ref 0.2–0.9)
Monocytes Relative: 12 %
NEUTROS ABS: 3 10*3/uL (ref 1.4–6.5)
NEUTROS PCT: 68 %
Platelets: 213 10*3/uL (ref 150–440)
RBC: 4.34 MIL/uL (ref 3.80–5.20)
RDW: 14.2 % (ref 11.5–14.5)
WBC: 4.4 10*3/uL (ref 3.6–11.0)

## 2018-01-12 LAB — URINALYSIS, COMPLETE (UACMP) WITH MICROSCOPIC
Bilirubin Urine: NEGATIVE
Glucose, UA: NEGATIVE mg/dL
Ketones, ur: NEGATIVE mg/dL
Leukocytes, UA: NEGATIVE
Nitrite: NEGATIVE
PROTEIN: NEGATIVE mg/dL
Specific Gravity, Urine: 1.001 — ABNORMAL LOW (ref 1.005–1.030)
pH: 7 (ref 5.0–8.0)

## 2018-01-12 LAB — COMPREHENSIVE METABOLIC PANEL
ALT: 22 U/L (ref 14–54)
AST: 28 U/L (ref 15–41)
Albumin: 3.6 g/dL (ref 3.5–5.0)
Alkaline Phosphatase: 56 U/L (ref 38–126)
Anion gap: 9 (ref 5–15)
BUN: 16 mg/dL (ref 6–20)
CHLORIDE: 104 mmol/L (ref 101–111)
CO2: 29 mmol/L (ref 22–32)
CREATININE: 1.13 mg/dL — AB (ref 0.44–1.00)
Calcium: 9 mg/dL (ref 8.9–10.3)
GFR calc non Af Amer: 47 mL/min — ABNORMAL LOW (ref >60)
GFR, EST AFRICAN AMERICAN: 55 mL/min — AB (ref >60)
Glucose, Bld: 80 mg/dL (ref 65–99)
Potassium: 3.5 mmol/L (ref 3.5–5.1)
SODIUM: 142 mmol/L (ref 135–145)
Total Bilirubin: 0.5 mg/dL (ref 0.3–1.2)
Total Protein: 7.2 g/dL (ref 6.5–8.1)

## 2018-01-12 LAB — GLUCOSE, CAPILLARY
GLUCOSE-CAPILLARY: 52 mg/dL — AB (ref 65–99)
GLUCOSE-CAPILLARY: 79 mg/dL (ref 65–99)
GLUCOSE-CAPILLARY: 90 mg/dL (ref 65–99)
GLUCOSE-CAPILLARY: 187 mg/dL — AB (ref 65–99)
Glucose-Capillary: 50 mg/dL — ABNORMAL LOW (ref 65–99)
Glucose-Capillary: 84 mg/dL (ref 65–99)
Glucose-Capillary: 59 mg/dL — ABNORMAL LOW (ref 65–99)
Glucose-Capillary: 231 mg/dL — ABNORMAL HIGH (ref 65–99)
Glucose-Capillary: 157 mg/dL — ABNORMAL HIGH (ref 65–99)
Glucose-Capillary: 153 mg/dL — ABNORMAL HIGH (ref 65–99)
Glucose-Capillary: 233 mg/dL — ABNORMAL HIGH (ref 65–99)

## 2018-01-12 LAB — MRSA PCR SCREENING: MRSA by PCR: NEGATIVE

## 2018-01-12 MED ORDER — ACETAMINOPHEN 325 MG PO TABS: 650.0000 mg | ORAL_TABLET | Freq: Four times a day (QID) | ORAL | Status: DC | PRN

## 2018-01-12 MED ORDER — ASPIRIN EC 81 MG PO TBEC
81.0000 mg | DELAYED_RELEASE_TABLET | Freq: Every day | ORAL | Status: DC
  Administered 2018-01-13: 81 mg via ORAL
  Filled 2018-01-12: qty 1

## 2018-01-12 MED ORDER — PANTOPRAZOLE SODIUM 40 MG PO TBEC
40.0000 mg | DELAYED_RELEASE_TABLET | Freq: Every day | ORAL | Status: DC
  Administered 2018-01-13: 40 mg via ORAL
  Filled 2018-01-12: qty 1

## 2018-01-12 MED ORDER — SOTALOL HCL 80 MG PO TABS
80.0000 mg | ORAL_TABLET | Freq: Two times a day (BID) | ORAL | Status: DC
  Administered 2018-01-12 – 2018-01-13 (×2): 80 mg via ORAL
  Filled 2018-01-12 (×4): qty 1

## 2018-01-12 MED ORDER — ONDANSETRON HCL 4 MG/2ML IJ SOLN: 4.0000 mg | Freq: Four times a day (QID) | INTRAMUSCULAR | Status: DC | PRN

## 2018-01-12 MED ORDER — HYDRALAZINE HCL 25 MG PO TABS
25.0000 mg | ORAL_TABLET | Freq: Two times a day (BID) | ORAL | Status: DC
  Administered 2018-01-12 – 2018-01-13 (×2): 25 mg via ORAL
  Filled 2018-01-12 (×2): qty 1

## 2018-01-12 MED ORDER — ONDANSETRON HCL 4 MG PO TABS: 4.0000 mg | ORAL_TABLET | Freq: Four times a day (QID) | ORAL | Status: DC | PRN

## 2018-01-12 MED ORDER — DEXTROSE 10 % IV SOLN
INTRAVENOUS | Status: DC
  Administered 2018-01-12 – 2018-01-13 (×2): via INTRAVENOUS

## 2018-01-12 MED ORDER — IPRATROPIUM BROMIDE 0.03 % NA SOLN
2.0000 | Freq: Three times a day (TID) | NASAL | Status: DC | PRN
  Filled 2018-01-12: qty 30

## 2018-01-12 MED ORDER — SERTRALINE HCL 50 MG PO TABS
100.0000 mg | ORAL_TABLET | Freq: Every day | ORAL | Status: DC
  Administered 2018-01-13: 100 mg via ORAL
  Filled 2018-01-12: qty 2

## 2018-01-12 MED ORDER — TRAMADOL HCL 50 MG PO TABS: 50.0000 mg | ORAL_TABLET | Freq: Two times a day (BID) | ORAL | Status: DC | PRN

## 2018-01-12 MED ORDER — CALCIUM CARBONATE-VITAMIN D 500-200 MG-UNIT PO TABS
1.0000 | ORAL_TABLET | Freq: Two times a day (BID) | ORAL | Status: DC
  Administered 2018-01-12 – 2018-01-13 (×2): 1 via ORAL
  Filled 2018-01-12 (×3): qty 1

## 2018-01-12 MED ORDER — DEXTROSE 50 % IV SOLN: 1.0000 | Freq: Once | INTRAVENOUS | Status: DC

## 2018-01-12 MED ORDER — POTASSIUM CHLORIDE CRYS ER 10 MEQ PO TBCR
10.0000 meq | EXTENDED_RELEASE_TABLET | Freq: Every day | ORAL | Status: DC
  Administered 2018-01-13: 10 meq via ORAL
  Filled 2018-01-12 (×3): qty 1

## 2018-01-12 MED ORDER — LORATADINE 10 MG PO TABS
10.0000 mg | ORAL_TABLET | Freq: Every day | ORAL | Status: DC
Start: 2018-01-13 — End: 2018-01-13
  Administered 2018-01-13: 10 mg via ORAL
  Filled 2018-01-12: qty 1

## 2018-01-12 MED ORDER — ROSUVASTATIN CALCIUM 10 MG PO TABS
20.0000 mg | ORAL_TABLET | Freq: Every day | ORAL | Status: DC
  Administered 2018-01-13: 20 mg via ORAL
  Filled 2018-01-12: qty 2

## 2018-01-12 MED ORDER — HYDROCHLOROTHIAZIDE 25 MG PO TABS
25.0000 mg | ORAL_TABLET | Freq: Every day | ORAL | Status: DC
  Administered 2018-01-13: 25 mg via ORAL
  Filled 2018-01-12: qty 1

## 2018-01-12 MED ORDER — FERROUS SULFATE 325 (65 FE) MG PO TABS
325.0000 mg | ORAL_TABLET | Freq: Every day | ORAL | Status: DC
  Administered 2018-01-13: 325 mg via ORAL
  Filled 2018-01-12: qty 1

## 2018-01-12 MED ORDER — ACETAMINOPHEN 650 MG RE SUPP: 650.0000 mg | Freq: Four times a day (QID) | RECTAL | Status: DC | PRN

## 2018-01-12 MED ORDER — FLUTICASONE PROPIONATE 50 MCG/ACT NA SUSP
2.0000 | Freq: Every day | NASAL | Status: DC
  Administered 2018-01-13: 2 via NASAL
  Filled 2018-01-12: qty 16

## 2018-01-12 MED ORDER — ENOXAPARIN SODIUM 40 MG/0.4ML ~~LOC~~ SOLN
40.0000 mg | SUBCUTANEOUS | Status: DC
  Administered 2018-01-12: 40 mg via SUBCUTANEOUS
  Filled 2018-01-12: qty 0.4

## 2018-01-12 NOTE — ED Provider Notes (Signed)
[facility] Emergency Department Provider Note  ____________________________________________   First MD Initiated Contact with Patient 01/12/18 0820     (approximate)  I have reviewed the triage vital signs and the nursing notes.   HISTORY  Chief Complaint Hypoglycemia  Level 5 exemption history limited by the patient's altered mental status  HPI Virginia Barnes is a 72 y.o. female who comes to the emergency department from her nursing home for increasing falls and confusion this morning.  They checked her blood sugar and noted it was 29 and gave her orange juice.  When EMS arrived it was over 100.  The patient was seen and evaluated in our emergency department yesterday and found to be hypoglycemic of unclear etiology.  Felt to be secondary to glimepiride and she was observed multiple hours, given food, and discharged back to her nursing facility.  According to the facility she did not have any more diabetic meds however her blood sugar once again dropped.  Past Medical History:  Diagnosis Date  . CHF (congestive heart failure) (HCC)   . Diabetes mellitus without complication (HCC)   . Diverticulitis   . Hypertension     Patient Active Problem List   Diagnosis Date Noted  . Hypoglycemia 01/12/2018  . Knee pain 05/17/2017    Past Surgical History:  Procedure Laterality Date  . ABDOMINAL HYSTERECTOMY    . CARDIAC CATHETERIZATION Left 04/13/2016   Procedure: Left Heart Cath and Coronary Angiography;  Surgeon: Laurier Nancy, MD;  Location: [facility] INVASIVE CV LAB;  Service: Cardiovascular;  Laterality: Left;  . JOINT REPLACEMENT      Prior to Admission medications   Medication Sig Start Date End Date Taking? Authorizing Provider  ACCU-CHEK AVIVA PLUS test strip  12/26/16  Yes [provider]  ASPIRIN LOW DOSE 81 MG EC tablet Take 1 tablet by mouth daily. 09/26/15  Yes [provider]  ASSURE COMFORT LANCETS 30G MISC  12/26/16  Yes  [provider]  Calcium Carbonate-Vitamin D3 (CALCIUM 600/VITAMIN D) 600-400 MG-UNIT TABS Take 1 tablet by mouth 2 (two) times daily.   Yes [provider]  cetirizine (ZYRTEC) 10 MG tablet Take 10 mg by mouth every evening.   Yes [provider]  ferrous sulfate 325 (65 FE) MG EC tablet Take 325 mg by mouth daily.   Yes [provider]  fluticasone (FLONASE) 50 MCG/ACT nasal spray Place 2 sprays into both nostrils daily.   Yes [provider]  glimepiride (AMARYL) 4 MG tablet Take 4 mg by mouth 2 (two) times daily.   Yes [provider]  hydrALAZINE (APRESOLINE) 25 MG tablet Take 1 tablet by mouth 2 (two) times daily. 11/04/15  Yes [provider]  hydrochlorothiazide (HYDRODIURIL) 25 MG tablet Take 1 tablet by mouth daily. 11/04/15  Yes [provider]  ipratropium (ATROVENT) 0.03 % nasal spray Place 2 sprays into both nostrils 3 (three) times daily as needed for rhinitis.   Yes [provider]  omeprazole (PRILOSEC) 40 MG capsule Take 1 capsule by mouth daily. 09/26/15  Yes [provider]  potassium chloride (K-DUR) 10 MEQ tablet Take 10 mEq by mouth daily.    Yes [provider]  rosuvastatin (CRESTOR) 20 MG tablet Take 20 mg by mouth daily.   Yes [provider]  sertraline (ZOLOFT) 100 MG tablet Take 100 mg by mouth daily.   Yes [provider]  sotalol (BETAPACE) 80 MG tablet 80 mg 2 (two) times daily.  Yes [provider]  traMADol (ULTRAM) 50 MG tablet Take 1 tablet (50 mg total) by mouth every 6 (six) hours as needed. Patient taking differently: Take 50 mg by mouth 2 (two) times daily as needed.  10/24/17 10/24/18 Yes Darci Current, MD  Blood Glucose Calibration (ACCU-CHEK AVIVA) SOLN  12/26/16   [provider]  insulin aspart (NOVOLOG) 100 UNIT/ML injection 1-150 - no coverage.  151-200 - 2 units 201-250 4 units 251-300 - 6 units 301-350 - 8  units 351-400 - 10 units and call MD. 01/13/18   Houston Siren, MD    Allergies Peanuts [peanut oil]; Lasix [furosemide]; and Lisinopril  Family History  Problem Relation Age of Onset  . Stomach cancer Sister 53  . Vaginal cancer Sister 109  . Heart disease Father   . Breast cancer Neg Hx     Social History Social History   Tobacco Use  . Smoking status: Former Smoker    Packs/day: 0.50    Years: 15.00    Pack years: 7.50    Types: Cigarettes  . Smokeless tobacco: Never Used  Substance Use Topics  . Alcohol use: No  . Drug use: No    Review of Systems Level 5 exemption history limited by the patient's clinical condition ____________________________________________   PHYSICAL EXAM:  VITAL SIGNS: ED Triage Vitals  Enc Vitals Group     BP      Pulse      Resp      Temp      Temp src      SpO2      Weight      Height      Head Circumference      Peak Flow      Pain Score      Pain Loc      Pain Edu?      Excl. in GC?     Constitutional: No acute distress but clearly confused.  Does not know where she is Eyes: PERRL EOMI. mid range and brisk Head: Atraumatic. Nose: No congestion/rhinnorhea. Mouth/Throat: No trismus Neck: No stridor.   Cardiovascular: Normal rate, regular rhythm. Grossly normal heart sounds.  Good peripheral circulation. Respiratory: Normal respiratory effort.  No retractions. Lungs CTAB and moving good air Gastrointestinal: Soft nontender Musculoskeletal: No lower extremity edema   Neurologic: . No gross focal neurologic deficits are appreciated. Skin:  Skin is warm, dry and intact. No rash noted. Psychiatric: Confused    ____________________________________________   DIFFERENTIAL includes but not limited to  Dehydration, acute kidney injury, hypoglycemia, insulinoma ____________________________________________   LABS (all labs ordered are listed, but only abnormal results are displayed)  Labs Reviewed  URINALYSIS,  COMPLETE (UACMP) WITH MICROSCOPIC - Abnormal; Notable for the following components:      Result Value   Color, Urine STRAW (*)    APPearance CLEAR (*)    Specific Gravity, Urine 1.001 (*)    Hgb urine dipstick MODERATE (*)    Bacteria, UA RARE (*)    Squamous Epithelial / LPF 0-5 (*)    All other components within normal limits  COMPREHENSIVE METABOLIC PANEL - Abnormal; Notable for the following components:   Creatinine, Ser 1.13 (*)    GFR calc non Af Amer 47 (*)    GFR calc Af Amer 55 (*)    All other components within normal limits  CBC WITH DIFFERENTIAL/PLATELET - Abnormal; Notable for the following components:   MCHC 31.7 (*)    Lymphs Abs  0.8 (*)    All other components within normal limits  GLUCOSE, CAPILLARY - Abnormal; Notable for the following components:   Glucose-Capillary 50 (*)    All other components within normal limits  GLUCOSE, CAPILLARY - Abnormal; Notable for the following components:   Glucose-Capillary 52 (*)    All other components within normal limits  GLUCOSE, CAPILLARY - Abnormal; Notable for the following components:   Glucose-Capillary 59 (*)    All other components within normal limits  GLUCOSE, CAPILLARY - Abnormal; Notable for the following components:   Glucose-Capillary 231 (*)    All other components within normal limits  GLUCOSE, CAPILLARY - Abnormal; Notable for the following components:   Glucose-Capillary 157 (*)    All other components within normal limits  BASIC METABOLIC PANEL - Abnormal; Notable for the following components:   Glucose, Bld 214 (*)    Creatinine, Ser 1.16 (*)    Calcium 8.7 (*)    GFR calc non Af Amer 46 (*)    GFR calc Af Amer 53 (*)    All other components within normal limits  GLUCOSE, CAPILLARY - Abnormal; Notable for the following components:   Glucose-Capillary 187 (*)    All other components within normal limits  GLUCOSE, CAPILLARY - Abnormal; Notable for the following components:   Glucose-Capillary 153 (*)     All other components within normal limits  GLUCOSE, CAPILLARY - Abnormal; Notable for the following components:   Glucose-Capillary 233 (*)    All other components within normal limits  GLUCOSE, CAPILLARY - Abnormal; Notable for the following components:   Glucose-Capillary 141 (*)    All other components within normal limits  GLUCOSE, CAPILLARY - Abnormal; Notable for the following components:   Glucose-Capillary 123 (*)    All other components within normal limits  GLUCOSE, CAPILLARY - Abnormal; Notable for the following components:   Glucose-Capillary 123 (*)    All other components within normal limits  MRSA PCR SCREENING  GLUCOSE, CAPILLARY  GLUCOSE, CAPILLARY  GLUCOSE, CAPILLARY    Lab work reviewed by me shows normal renal function with low blood sugar __________________________________________  EKG   ____________________________________________  RADIOLOGY   ____________________________________________   PROCEDURES  Procedure(s) performed: no  .Critical Care Performed by: Merrily Brittle, MD Authorized by: Merrily Brittle, MD   Critical care provider statement:    Critical care time (minutes):  30   Critical care time was exclusive of:  Separately billable procedures and treating other patients   Critical care was necessary to treat or prevent imminent or life-threatening deterioration of the following conditions:  Metabolic crisis and endocrine crisis   Critical care was time spent personally by me on the following activities:  Development of treatment plan with patient or surrogate, discussions with consultants, evaluation of patient's response to treatment, examination of patient, obtaining history from patient or surrogate, ordering and performing treatments and interventions, ordering and review of laboratory studies, ordering and review of radiographic studies, pulse oximetry, re-evaluation of patient's condition and review of old charts    Critical  Care performed: Yes  Observation: no ____________________________________________   INITIAL IMPRESSION / ASSESSMENT AND PLAN / ED COURSE  Pertinent labs & imaging results that were available during my care of the patient were reviewed by me and considered in my medical decision making (see chart for details).  The patient arrives clearly confused with repeat blood sugar 50.  Unclear etiology of her symptoms but as this is a recurrence I will place her on  a D10 drip, make her nothing by mouth, and she will require inpatient admission for workup and evaluation of her recurrent hypoglycemia.  I discussed with the hospitalist who is graciously agreed to admit the patient to his service.      ____________________________________________   FINAL CLINICAL IMPRESSION(S) / ED DIAGNOSES  Final diagnoses:  Hypoglycemia      NEW MEDICATIONS STARTED DURING THIS VISIT:  Current Discharge Medication List    START taking these medications   Details  insulin aspart (NOVOLOG) 100 UNIT/ML injection 1-150 - no coverage.  151-200 - 2 units 201-250 4 units 251-300 - 6 units 301-350 - 8 units 351-400 - 10 units and call MD. Rob BuntingQty: 10 mL, Refills: 11         Note:  This document was prepared using Dragon voice recognition software and may include unintentional dictation errors.     Merrily Brittleifenbark, Marvella Jenning, MD 01/13/18 1358

## 2018-01-12 NOTE — Progress Notes (Signed)
ADMISSION NOTE:  Pt arrived to room 148 via stretcher from ED. Pt alert and oriented. VSS. Skin assessment complete, sacral dressing applied. Bed in lowest position, call bell in reach and bed alarm on.

## 2018-01-12 NOTE — ED Notes (Signed)
Spoke to [facility] staff who state they gave pt orange juice prior to EMS arrival.

## 2018-01-12 NOTE — Progress Notes (Signed)
CRITICAL VALUE ALERT  Critical Value:  BG 52  Provider Notified: MD Sainani  Orders Received/Actions taken: 1 orange juice given BG 59, 2nd orange juice given, BG now 90.  Orders received for Q2 blood glucose check for 8 hours and 1 amp D5. Order placed.

## 2018-01-12 NOTE — H&P (Signed)
[facility] [place] [facility]   PATIENT NAME: Virginia Barnes    MR#:  [id number]  DATE OF BIRTH:  [birth year]  DATE OF ADMISSION:  01/12/2018  PRIMARY CARE PHYSICIAN: McLean-Scocuzza, Pasty Spillersracy N, MD   REQUESTING/REFERRING PHYSICIAN: Dr. Merrily BrittleNeil Rifenbark  CHIEF COMPLAINT:   Chief Complaint  Patient presents with  . Hypoglycemia    HISTORY OF PRESENT ILLNESS:  Virginia Barnes  is a 72 y.o. female with a known history of diabetes, hypertension, history of CHF, GERD, paroxysmal atrial fibrillation who presents to the hospital due to altered mental status and noted to be hypoglycemic. Patient presented to the emergency room yesterday and was noted to have hypoglycemia with blood sugars in the 30s. Patient was given dextrose in the ER, and her blood sugars improved and she was discharged back to assisted living and taken off her diabetic meds. She now returns back to the ER she was found altered and confused at the assisted living and noted to have blood sugars in the high 20s to low 30s this morning. Patient was given something to eat and drink in the ER and also placed on a dextrose drip and blood sugars have improved since then. Hospitalist services were contacted for further treatment and evaluation. Patient denies any nausea, vomiting, abdominal pain, fever, chills, cough or any other associated symptoms. Patient does say she feels somewhat dizzy and lightheaded when she attempts to ambulate.  PAST MEDICAL HISTORY:   Past Medical History:  Diagnosis Date  . CHF (congestive heart failure) (HCC)   . Diabetes mellitus without complication (HCC)   . Diverticulitis   . Hypertension     PAST SURGICAL HISTORY:   Past Surgical History:  Procedure Laterality Date  . ABDOMINAL HYSTERECTOMY    . CARDIAC CATHETERIZATION Left 04/13/2016   Procedure: Left Heart Cath and Coronary Angiography;  Surgeon: Laurier NancyShaukat A Khan, MD;  Location: [facility] INVASIVE CV LAB;  Service: Cardiovascular;   Laterality: Left;  . JOINT REPLACEMENT      SOCIAL HISTORY:   Social History   Tobacco Use  . Smoking status: Former Smoker    Packs/day: 0.50    Years: 15.00    Pack years: 7.50    Types: Cigarettes  . Smokeless tobacco: Never Used  Substance Use Topics  . Alcohol use: No    FAMILY HISTORY:   Family History  Problem Relation Age of Onset  . Stomach cancer Sister 1225  . Vaginal cancer Sister 6130  . Heart disease Father   . Breast cancer Neg Hx     DRUG ALLERGIES:   Allergies  Allergen Reactions  . Peanuts [Peanut Oil] Shortness Of Breath  . Lasix [Furosemide] Swelling    Tongue swelling  . Lisinopril Swelling    Tongue swelling    REVIEW OF SYSTEMS:   Review of Systems  Constitutional: Negative for fever and weight loss.  HENT: Negative for congestion, nosebleeds and tinnitus.   Eyes: Negative for blurred vision, double vision and redness.  Respiratory: Negative for cough, hemoptysis and shortness of breath.   Cardiovascular: Negative for chest pain, orthopnea, leg swelling and PND.  Gastrointestinal: Negative for abdominal pain, diarrhea, melena, nausea and vomiting.  Genitourinary: Negative for dysuria, hematuria and urgency.  Musculoskeletal: Negative for falls and joint pain.  Neurological: Positive for dizziness and weakness. Negative for tingling, sensory change, focal weakness, seizures and headaches.  Endo/Heme/Allergies: Negative for polydipsia. Does not bruise/bleed easily.  Psychiatric/Behavioral: Negative for depression and memory loss. The patient is  not nervous/anxious.     MEDICATIONS AT HOME:   Prior to Admission medications   Medication Sig Start Date End Date Taking? Authorizing Provider  ACCU-CHEK AVIVA PLUS test strip  12/26/16  Yes [provider]  ASPIRIN LOW DOSE 81 MG EC tablet Take 1 tablet by mouth daily. 09/26/15  Yes [provider]  ASSURE COMFORT LANCETS 30G MISC  12/26/16  Yes [provider]  Calcium  Carbonate-Vitamin D3 (CALCIUM 600/VITAMIN D) 600-400 MG-UNIT TABS Take 1 tablet by mouth 2 (two) times daily.   Yes [provider]  cetirizine (ZYRTEC) 10 MG tablet Take 10 mg by mouth every evening.   Yes [provider]  ferrous sulfate 325 (65 FE) MG EC tablet Take 325 mg by mouth daily.   Yes [provider]  fluticasone (FLONASE) 50 MCG/ACT nasal spray Place 2 sprays into both nostrils daily.   Yes [provider]  glimepiride (AMARYL) 4 MG tablet Take 4 mg by mouth 2 (two) times daily.   Yes [provider]  hydrALAZINE (APRESOLINE) 25 MG tablet Take 1 tablet by mouth 2 (two) times daily. 11/04/15  Yes [provider]  hydrochlorothiazide (HYDRODIURIL) 25 MG tablet Take 1 tablet by mouth daily. 11/04/15  Yes [provider]  ipratropium (ATROVENT) 0.03 % nasal spray Place 2 sprays into both nostrils 3 (three) times daily as needed for rhinitis.   Yes [provider]  omeprazole (PRILOSEC) 40 MG capsule Take 1 capsule by mouth daily. 09/26/15  Yes [provider]  potassium chloride (K-DUR) 10 MEQ tablet Take 10 mEq by mouth daily.    Yes [provider]  rosuvastatin (CRESTOR) 20 MG tablet Take 20 mg by mouth daily.   Yes [provider]  sertraline (ZOLOFT) 100 MG tablet Take 100 mg by mouth daily.   Yes [provider]  sotalol (BETAPACE) 80 MG tablet 80 mg 2 (two) times daily.    Yes [provider]  traMADol (ULTRAM) 50 MG tablet Take 1 tablet (50 mg total) by mouth every 6 (six) hours as needed. Patient taking differently: Take 50 mg by mouth 2 (two) times daily as needed.  10/24/17 10/24/18 Yes Darci Current, MD  Blood Glucose Calibration (ACCU-CHEK AVIVA) SOLN  12/26/16   [provider]      VITAL SIGNS:  Blood pressure (!) 151/66, pulse 78, temperature (!) 97.4 F (36.3 C), resp. rate (!) 24, height 5\' 9"  (1.753 m), weight 103.9 kg (229 lb), SpO2 98  %.  PHYSICAL EXAMINATION:  Physical Exam  GENERAL:  72 y.o.-year-old patient lying in the bed in no acute distress.  EYES: Pupils equal, round, reactive to light and accommodation. No scleral icterus. Extraocular muscles intact.  HEENT: Head atraumatic, normocephalic. Oropharynx and nasopharynx clear. No oropharyngeal erythema, moist oral mucosa  NECK:  Supple, no jugular venous distention. No thyroid enlargement, no tenderness.  LUNGS: Normal breath sounds bilaterally, no wheezing, rales, rhonchi. No use of accessory muscles of respiration.  CARDIOVASCULAR: S1, S2 RRR. No murmurs, rubs, gallops, clicks.  ABDOMEN: Soft, nontender, nondistended. Bowel sounds present. No organomegaly or mass.  EXTREMITIES: No pedal edema, cyanosis, or clubbing. + 2 pedal & radial pulses b/l.   NEUROLOGIC: Cranial nerves II through XII are intact. No focal Motor or sensory deficits appreciated b/l PSYCHIATRIC: The patient is alert and oriented x 3.  SKIN: No obvious rash, lesion, or ulcer.   LABORATORY PANEL:   CBC Recent Labs  Lab 01/12/18 0821  WBC  4.4  HGB 12.0  HCT 37.9  PLT 213   ------------------------------------------------------------------------------------------------------------------  Chemistries  Recent Labs  Lab 01/12/18 0821  NA 142  K 3.5  CL 104  CO2 29  GLUCOSE 80  BUN 16  CREATININE 1.13*  CALCIUM 9.0  AST 28  ALT 22  ALKPHOS 56  BILITOT 0.5   ------------------------------------------------------------------------------------------------------------------  Cardiac Enzymes Recent Labs  Lab 01/11/18 1307  TROPONINI 0.03*   ------------------------------------------------------------------------------------------------------------------  RADIOLOGY:  Dg Chest 2 View  Result Date: 01/11/2018 CLINICAL DATA:  Cough EXAM: CHEST  2 VIEW COMPARISON:  11/21/2015 FINDINGS: Heart is borderline enlarged. No confluent airspace opacities or effusions. No acute bony  abnormality. Calcifications in the aortic arch. IMPRESSION: Borderline heart size.  No active disease. Electronically Signed   By: Charlett Nose M.D.   On: 01/11/2018 09:19   Ct Head Wo Contrast  Result Date: 01/11/2018 CLINICAL DATA:  Head trauma after fall. EXAM: CT HEAD WITHOUT CONTRAST TECHNIQUE: Contiguous axial images were obtained from the base of the skull through the vertex without intravenous contrast. COMPARISON:  CT head dated November 21, 2015. FINDINGS: Brain: No evidence of acute infarction, hemorrhage, hydrocephalus, extra-axial collection or mass lesion/mass effect. Stable cerebral atrophy and chronic microvascular ischemic changes. Vascular: Atherosclerotic vascular calcification of the carotid siphons. No hyperdense vessel. Skull: Normal. Negative for fracture or focal lesion. Sinuses/Orbits: No acute finding. Other: None. IMPRESSION: 1. No acute intracranial abnormality. Stable atrophy and chronic microvascular ischemic changes. Electronically Signed   By: Obie Dredge M.D.   On: 01/11/2018 09:14     IMPRESSION AND PLAN:   72 year old female with past medical history of diabetes, hypertension, history of CHF, paroxysmal atrial fibrillation, GERD who presents to the hospital due to altered mental status and noted to be hypoglycemic.  1. Altered mental status/encephalopathy-secondary to severe hypoglycemia. -CT head yesterday in the ER was negative for acute pathology. As her blood sugars have improved her mental status is also improved.  2. Hypoglycemia-etiology unclear. No evidence of acute infectious source. No new change in her diabetic regimen. -We'll discontinue her metformin, Amaryl. Continue dextrose drip. Follow blood sugars. - if persists then will consider check C-peptide level r/o Insulinoma.   3. History of paroxysmal atrial fibrillation-rate controlled. Continue sotalol. Patient not quite relation given her high fall risk. Continue aspirin.  4.  Hyperlipidemia-continue Crestor.  5. Essential hypertension-continue hydralazine, hydrochlorothiazide.  6. GERD-continue Protonix.  7. Depression-continue Zoloft.     All the records are reviewed and case discussed with ED provider. Management plans discussed with the patient, family and they are in agreement.  CODE STATUS: Full code  TOTAL TIME TAKING CARE OF THIS PATIENT: 45 minutes.    Houston Siren M.D on 01/12/2018 at 9:55 AM  Between 7am to 6pm - Pager - [phone]  After 6pm go to [email or web address] - password [email or web address] [facility]  [facility]  Office  [phone]  CC: Primary care physician; McLean-Scocuzza, Pasty Spillers, MD

## 2018-01-12 NOTE — ED Triage Notes (Signed)
Pt presents to ED via [facility] from [facility] c/o hypoglycemia. AH report initial CBG 29, EMS check 123. CBG on arrival 50. EMS report pt seen here yesterday for same. Has not taken any meds for DM today.

## 2018-01-12 NOTE — ED Notes (Signed)
Labs --> admit

## 2018-01-12 NOTE — Progress Notes (Signed)
MRSA PCR sent 

## 2018-01-13 DIAGNOSIS — E11649 Type 2 diabetes mellitus with hypoglycemia without coma: Secondary | ICD-10-CM | POA: Diagnosis not present

## 2018-01-13 LAB — BASIC METABOLIC PANEL
ANION GAP: 11 (ref 5–15)
BUN: 17 mg/dL (ref 6–20)
CALCIUM: 8.7 mg/dL — AB (ref 8.9–10.3)
CO2: 28 mmol/L (ref 22–32)
CREATININE: 1.16 mg/dL — AB (ref 0.44–1.00)
Chloride: 102 mmol/L (ref 101–111)
GFR, EST AFRICAN AMERICAN: 53 mL/min — AB (ref >60)
GFR, EST NON AFRICAN AMERICAN: 46 mL/min — AB (ref >60)
Glucose, Bld: 214 mg/dL — ABNORMAL HIGH (ref 65–99)
Potassium: 3.7 mmol/L (ref 3.5–5.1)
Sodium: 141 mmol/L (ref 135–145)

## 2018-01-13 LAB — GLUCOSE, CAPILLARY
GLUCOSE-CAPILLARY: 141 mg/dL — AB (ref 65–99)
GLUCOSE-CAPILLARY: 123 mg/dL — AB (ref 65–99)
GLUCOSE-CAPILLARY: 123 mg/dL — AB (ref 65–99)

## 2018-01-13 MED ORDER — NITROGLYCERIN 2 % TD OINT
0.5000 [in_us] | TOPICAL_OINTMENT | Freq: Once | TRANSDERMAL | Status: AC
  Administered 2018-01-13: 0.5 [in_us] via TOPICAL
  Filled 2018-01-13: qty 0.5

## 2018-01-13 MED ORDER — NITROGLYCERIN 2 % TD OINT
0.5000 [in_us] | TOPICAL_OINTMENT | Freq: Once | TRANSDERMAL | Status: DC
  Filled 2018-01-13: qty 30

## 2018-01-13 MED ORDER — LABETALOL HCL 5 MG/ML IV SOLN
5.0000 mg | INTRAVENOUS | Status: DC | PRN
  Administered 2018-01-13: 5 mg via INTRAVENOUS
  Filled 2018-01-13: qty 4

## 2018-01-13 MED ORDER — INSULIN ASPART 100 UNIT/ML ~~LOC~~ SOLN: SUBCUTANEOUS | 11 refills | Status: DC

## 2018-01-13 NOTE — Clinical Social Work Note (Signed)
Clinical Social Work Assessment  Patient Details  Name: Virginia Barnes MRN: [id number] Date of Birth: July 26, 1946  Date of referral:  01/13/18               Reason for consult:  Other (Comment Required)(Patient is from [facility] )                Permission sought to share information with:  Chartered certified accountant granted to share information::  Yes, Verbal Permission Granted  Name::      [facility]   Agency::     Relationship::     Contact Information:     Housing/Transportation Living arrangements for the past 2 months:  [place] of Information:  Patient, Siblings Patient Interpreter Needed:  None Criminal Activity/Legal Involvement Pertinent to Current Situation/Hospitalization:  No - Comment as needed Significant Relationships:  Adult Children, Siblings Lives with:  Facility Resident Do you feel safe going back to the place where you live?  Yes Need for family participation in patient care:  Yes (Comment)  Care giving concerns:  Patient recently moved into [facility] last week.   Social Worker assessment / plan:  Holiday representative (CSW) reviewed chart and noted that patient is from [facility] and PT is recommending SNF. CSW met with patient and her 2 brothers were at bedside. Patient was alert and oriented X4 and was laying in the bed. CSW introduced self and explained role of CSW department. Patient reported that she recently moved into [facility] and wants to return there today. Per patient her daughter in law will transport her.  Patient reported that she is not interested in SNF placement because she just moved into [facility].   CSW contacted [facility] and spoke to [employer] and [employer]. CSW made them aware that PT is recommending SNF however patient prefers to return to ALF. Per Jenny Reichmann patient can return to ALF today.   RN will call report  and patient's daughter in law will transport. CSW sent D/C summary and FL2 to [facility]. Please reconsult if future social work needs arise. CSW signing off .   Employment status:  Retired Nurse, adult PT Recommendations:  [place] / Referral to community resources:  Other (Comment Required)(Patient will D/C back to ALF. )  Patient/Family's Response to care:  Patient is agreeable to ALF today.   Patient/Family's Understanding of and Emotional Response to Diagnosis, Current Treatment, and Prognosis: Patient was very pleasant and thanked CSW for assistance.    Emotional Assessment Appearance:  Appears stated age Attitude/Demeanor/Rapport:    Affect (typically observed):  Accepting, Adaptable, Pleasant Orientation:  Oriented to Self, Oriented to Place, Oriented to  Time, Oriented to Situation Alcohol / Substance use:  Not Applicable Psych involvement (Current and /or in the community):  No (Comment)  Discharge Needs  Concerns to be addressed:  No discharge needs identified Readmission within the last 30 days:  No Current discharge risk:  None Barriers to Discharge:  No Barriers Identified   Taylon Louison, Veronia Beets, LCSW 01/13/2018, 8:32 PM

## 2018-01-13 NOTE — Progress Notes (Signed)
Pt being discharged to [facility] with home health per MD order. Report called to [facility] RN, Leonette Mostharles. Pt is A&O x 4, VSS, BG 123, pt denies pain. IVs removed without complication, pt dressed and waiting for transportation.   RN will continue to monitor.

## 2018-01-13 NOTE — Evaluation (Signed)
Physical Therapy Evaluation Patient Details Name: Virginia CreeGlenda C Maull MRN: [id number] DOB: 04/03/1946 Today's Date: 01/13/2018   History of Present Illness  Pt is a71 y.o.femalewith a known history of diabetes, hypertension, history of CHF, GERD, paroxysmal atrial fibrillation who presents to the hospital due to altered mental status and noted to be hypoglycemic. Patient presented to the emergency room yesterday and was noted to have hypoglycemia with blood sugars in the 30s. Patient was given dextrose in the ER, and her blood sugars improved and she was discharged back to assisted living and taken off her diabetic meds. She now returns back to the ER she was found altered and confused at the assisted living and noted to have blood sugars in the high 20s to low 30s this morning. Patient was given something to eat and drink in the ER and also placed on a dextrose drip and blood sugars have improved since then. Hospitalist services were contacted for further treatment and evaluation. Patient denies any nausea, vomiting, abdominal pain, fever, chills, cough or any other associated symptoms. Patient does say she feels somewhat dizzy and lightheaded when she attempts to ambulate.  Assessment includes AMS/encephalopathy secondary to hypoglycemia, A-fib, HLD, essential HTN, and GERD.    Clinical Impression  Pt presents with deficits in strength, transfers, mobility, gait, balance, and activity tolerance.  Pt required min A for rolling L/R and sup to sit along with extra time and effort.  Pt required min A to stand from an elevated surface and once in standing continued to require min A to prevent posterior LOB.  Pt only able to amb 3-4' at EOB and required frequent min A to prevent posterior LOB.  Pt is currently significantly below her baseline of mod I with amb facility distances in her ALF.  Pt is currently at a very high risk for falls and will benefit from PT services in a SNF setting upon discharge to safely  address above deficits for decreased caregiver assistance and eventual return to PLOF.      Follow Up Recommendations SNF; 24 hr assist and CGA with all standing activities/ambulation    Equipment Recommendations  Other (comment)(Pt would benefit from a RW vs a rollator that she uses now; unsure if she has access to one at her ALF)    Recommendations for Other Services       Precautions / Restrictions Precautions Precautions: Fall Restrictions Weight Bearing Restrictions: No      Mobility  Bed Mobility Overal bed mobility: Needs Assistance Bed Mobility: Supine to Sit     Supine to sit: Min assist        Transfers Overall transfer level: Needs assistance Equipment used: Rolling walker (2 wheeled) Transfers: Sit to/from Stand Sit to Stand: Min assist         General transfer comment: Min A to stand and to prevent posterior LOB once in standing  Ambulation/Gait Ambulation/Gait assistance: Min assist Ambulation Distance (Feet): 4 Feet Assistive device: Rolling walker (2 wheeled) Gait Pattern/deviations: Step-to pattern;Decreased step length - right;Decreased step length - left   Gait velocity interpretation: <1.8 ft/sec, indicative of risk for recurrent falls General Gait Details: Min A to prevent posterior LOB during amb  Stairs            Wheelchair Mobility    Modified Rankin (Stroke Patients Only)       Balance Overall balance assessment: Needs assistance Sitting-balance support: Feet unsupported;Feet supported;No upper extremity supported Sitting balance-Leahy Scale: Good     Standing balance  support: Bilateral upper extremity supported Standing balance-Leahy Scale: Poor Standing balance comment: Posterior instability in standing                             Pertinent Vitals/Pain Pain Assessment: No/denies pain    Home Living Family/patient expects to be discharged to:: Assisted living               Home Equipment:  Walker - 4 wheels      Prior Function Level of Independence: Needs assistance   Gait / Transfers Assistance Needed: Mod Ind amb facility distances with rollator with no fall history other than recent falls secondary to severe hypoglycemia; Ind with transfers and min A with bed mobility  ADL's / Homemaking Assistance Needed: Assistance provided by ALF staff for all ADLs        Hand Dominance        Extremity/Trunk Assessment   Upper Extremity Assessment Upper Extremity Assessment: Generalized weakness    Lower Extremity Assessment Lower Extremity Assessment: Generalized weakness       Communication   Communication: No difficulties  Cognition Arousal/Alertness: Awake/alert Behavior During Therapy: WFL for tasks assessed/performed Overall Cognitive Status: Within Functional Limits for tasks assessed                                        General Comments      Exercises Other Exercises Other Exercises: Anterior weight shifting activities in standing to address posterior instability   Assessment/Plan    PT Assessment Patient needs continued PT services  PT Problem List Decreased strength;Decreased activity tolerance;Decreased balance;Decreased mobility       PT Treatment Interventions DME instruction;Gait training;Functional mobility training;Balance training;Therapeutic exercise;Therapeutic activities;Patient/family education    PT Goals (Current goals can be found in the Care Plan section)  Acute Rehab PT Goals Patient Stated Goal: To walk better PT Goal Formulation: With patient Time For Goal Achievement: 01/26/18 Potential to Achieve Goals: Good    Frequency Min 2X/week   Barriers to discharge Decreased caregiver support      Co-evaluation               AM-PAC PT "6 Clicks" Daily Activity  Outcome Measure Difficulty turning over in bed (including adjusting bedclothes, sheets and blankets)?: Unable Difficulty moving from lying  on back to sitting on the side of the bed? : Unable Difficulty sitting down on and standing up from a chair with arms (e.g., wheelchair, bedside commode, etc,.)?: Unable Help needed moving to and from a bed to chair (including a wheelchair)?: A Little Help needed walking in hospital room?: A Lot Help needed climbing 3-5 steps with a railing? : A Lot 6 Click Score: 10    End of Session Equipment Utilized During Treatment: Gait belt Activity Tolerance: Patient tolerated treatment well Patient left: Other (comment)(On BSC with nursing present) Nurse Communication: Mobility status PT Visit Diagnosis: Unsteadiness on feet (R26.81);Muscle weakness (generalized) (M62.81);Difficulty in walking, not elsewhere classified (R26.2)    Time: 1610-9604 PT Time Calculation (min) (ACUTE ONLY): 39 min   Charges:   PT Evaluation $PT Eval Low Complexity: 1 Low PT Treatments $Therapeutic Activity: 8-22 mins   PT G Codes:        DElly Modena PT, DPT 01/13/18, 1:44 PM

## 2018-01-13 NOTE — [place] FL2 (Signed)
[place] MEDICAID FL2 LEVEL OF CARE SCREENING TOOL     IDENTIFICATION  Patient Name: Virginia Barnes Birthdate: [birth year] Sex: female Admission Date (Current Location): 01/12/2018  [place]   Facility and Address:  [facility], [address], [place], [place] [address]      Provider Number: [id number]  Attending Physician Name and Address:  Houston SirenSainani, Vivek J, MD  Relative Name and Phone Number:       Current Level of Care: Hospital Recommended Level of Care: Assisted Living Facility Prior Approval Number:    Date Approved/Denied:   PASRR Number:    Discharge Plan: Domiciliary (Rest home)    Current Diagnoses: Patient Active Problem List   Diagnosis Date Noted  . Hypoglycemia 01/12/2018  . Knee pain 05/17/2017    Orientation RESPIRATION BLADDER Height & Weight     Self, Time, Situation, Place  Normal Continent Weight: 229 lb (103.9 kg) Height:  5\' 9"  (175.3 cm)  BEHAVIORAL SYMPTOMS/MOOD NEUROLOGICAL BOWEL NUTRITION STATUS      Continent Diet(Diet: Low sodium/ Heart Healhty )  AMBULATORY STATUS COMMUNICATION OF NEEDS Skin   Limited Assist Verbally Normal                       Personal Care Assistance Level of Assistance  Bathing, Feeding, Dressing Bathing Assistance: Limited assistance Feeding assistance: Independent Dressing Assistance: Limited assistance     Functional Limitations Info  Sight, Hearing, Speech Sight Info: Adequate Hearing Info: Adequate Speech Info: Adequate    SPECIAL CARE FACTORS FREQUENCY  PT (By licensed PT)     PT Frequency: (2-3 home health PT and nurse. )              Contractures      Additional Factors Info  Code Status, Allergies Code Status Info: (Full Code. ) Allergies Info: (Peanuts Peanut Oil, Lasix Furosemide, Lisinopril)          Discharge Medications: Please see discharge summary for a list of discharge medications. Medication List      STOP taking these medications   glimepiride 4 MG tablet Commonly known as:  AMARYL     TAKE these medications   ACCU-CHEK AVIVA PLUS test strip Generic drug:  glucose blood   ACCU-CHEK AVIVA Soln   ASPIRIN LOW DOSE 81 MG EC tablet Generic drug:  aspirin Take 1 tablet by mouth daily.   ASSURE COMFORT LANCETS 30G Misc   CALCIUM 600/VITAMIN D 600-400 MG-UNIT Tabs Generic drug:  Calcium Carbonate-Vitamin D3 Take 1 tablet by mouth 2 (two) times daily.   cetirizine 10 MG tablet Commonly known as:  ZYRTEC Take 10 mg by mouth every evening.   ferrous sulfate 325 (65 FE) MG EC tablet Take 325 mg by mouth daily.   fluticasone 50 MCG/ACT nasal spray Commonly known as:  FLONASE Place 2 sprays into both nostrils daily.   hydrALAZINE 25 MG tablet Commonly known as:  APRESOLINE Take 1 tablet by mouth 2 (two) times daily.   hydrochlorothiazide 25 MG tablet Commonly known as:  HYDRODIURIL Take 1 tablet by mouth daily.   insulin aspart 100 UNIT/ML injection Commonly known as:  NOVOLOG 1-150 - no coverage.  151-200 - 2 units 201-250 4 units 251-300 - 6 units 301-350 - 8 units 351-400 - 10 units and call MD.   ipratropium 0.03 % nasal spray Commonly known as:  ATROVENT Place 2 sprays into both nostrils 3 (three) times daily as needed for  rhinitis.   omeprazole 40 MG capsule Commonly known as:  PRILOSEC Take 1 capsule by mouth daily.   potassium chloride 10 MEQ tablet Commonly known as:  K-DUR Take 10 mEq by mouth daily.   rosuvastatin 20 MG tablet Commonly known as:  CRESTOR Take 20 mg by mouth daily.   sertraline 100 MG tablet Commonly known as:  ZOLOFT Take 100 mg by mouth daily.   sotalol 80 MG tablet Commonly known as:  BETAPACE 80 mg 2 (two) times daily.   traMADol 50 MG tablet Commonly known as:  ULTRAM Take 1 tablet (50 mg total) by mouth every 6 (six) hours as needed. What changed:  when to take this   Relevant Imaging  Results: Relevant Lab Results: Additional Information (SSN: ) [id number]  Mahitha Hickling, Darleen Crocker, LCSW

## 2018-01-13 NOTE — Progress Notes (Signed)
Inpatient Diabetes Program Recommendations  AACE/ADA: New Consensus Statement on Inpatient Glycemic Control (2015)  Target Ranges:  Prepandial:   less than 140 mg/dL      Peak postprandial:   less than 180 mg/dL (1-2 hours)      Critically ill patients:  140 - 180 mg/dL  Results for Deeann CreeSHAW, Ziare C (MRN [id number]) as of 01/13/2018 11:30  Ref. Range 01/12/2018 08:23 01/12/2018 09:08 01/12/2018 10:38 01/12/2018 11:29 01/12/2018 11:42 01/12/2018 12:05 01/12/2018 14:11 01/12/2018 16:03 01/12/2018 18:08 01/12/2018 20:55 01/12/2018 22:38 01/13/2018 08:11  Glucose-Capillary Latest Ref Range: 65 - 99 mg/dL 50 (L) 84 79 52 (L) 59 (L) 90 231 (H) 157 (H) 187 (H) 153 (H) 233 (H) 141 (H)   Results for Deeann CreeSHAW, Cheila C (MRN [id number]) as of 01/13/2018 11:30  Ref. Range 01/11/2018 07:44 01/11/2018 08:04 01/11/2018 08:23 01/11/2018 08:51 01/11/2018 09:15 01/11/2018 09:44 01/11/2018 10:25 01/11/2018 11:01 01/11/2018 11:11 01/11/2018 12:35 01/11/2018 13:28 01/11/2018 14:47  Glucose-Capillary Latest Ref Range: 65 - 99 mg/dL 22 (LL) 98 78 74 50 (L) 54 (L) 165 (H) 183 (H) 185 (H) 119 (H) 103 (H) 114 (H)   Review of Glycemic Control  Diabetes history: DM2 Outpatient Diabetes medications: Amaryl 4 mg BID Current orders for Inpatient glycemic control: CBG monitoring  Inpatient Diabetes Program Recommendations:  Correction (SSI): Please consider ordering CBGs with Novolog 0-9 units TID with meals and Novolog 0-5 units QHS. Oral Agents: Noted Amaryl 4 mg BID noted on home medication list. High risk of hypoglycemia with Amaryl. Recommend discontinuing Amaryl as an outpatient and discharge on Novolog correction scale ACHS to be given at Assisted Living Facility. HgbA1C: Please consider ordering an A1C to evaluate glycemic control over the past 2-3 months.  NOTE: In reviewing chart, noted patient came to Emergency Room (ER) on 01/11/18 for hypoglycemia which was treated and patient was discharged from ER back to Assisted Living. Patient presented again to ER on  01/12/18 with recurrent hypoglycemia. There is high risk of hypoglycemia with Amaryl. Now that hypoglycemia has resolved, recommend ordering CBGs with Novolog correction ACHS and ordering an A1C. Also, recommend discontinuing use of Amaryl as an outpatient and prescribing CBGs with Novolog correction scale ACHS for glycemic control.   Thanks, Orlando PennerMarie Sachi Boulay, RN, MSN, CDE Diabetes Coordinator Inpatient Diabetes Program [phone] (Team Pager from 8am to 5pm)

## 2018-01-13 NOTE — Discharge Summary (Signed)
[facility] [place] [facility]   PATIENT NAME: Virginia Barnes    MR#:  [id number]  DATE OF BIRTH:  06-Sep-1946  DATE OF ADMISSION:  01/12/2018 ADMITTING PHYSICIAN: Virginia Siren, MD  DATE OF DISCHARGE: 01/13/2018  PRIMARY CARE PHYSICIAN: McLean-Scocuzza, Pasty Spillers, MD    ADMISSION DIAGNOSIS:  Hypoglycemia [E16.2]  DISCHARGE DIAGNOSIS:  Active Problems:   Hypoglycemia   SECONDARY DIAGNOSIS:   Past Medical History:  Diagnosis Date  . CHF (congestive heart failure) (HCC)   . Diabetes mellitus without complication (HCC)   . Diverticulitis   . Hypertension     HOSPITAL COURSE:   72 year old female with past medical history of diabetes, hypertension, history of CHF, paroxysmal atrial fibrillation, GERD who presents to the hospital due to altered mental status and noted to be hypoglycemic.  1. Altered mental status/encephalopathy-secondary to severe hypoglycemia. -She's blood sugars have improved, and her mental status is back to baseline now. -CT head yesterday in the ER was negative for acute pathology.   2. Hypoglycemia-patient was on metformin and Glimeperide and those were probably contributing to her hypoglycemia. Patient has been taken off all oral hypoglycemics. She was on a D10 drip and her blood sugars have improved. -At this time I will be discharging the patient just on a sliding scale insulin and have her follow up with a primary care physician before restarting any oral medications.  3. History of paroxysmal atrial fibrillation- this remained rate controlled. Continue sotalol.  - high fall risk and therefore no on long term anti-coagulation.  Cont. ASA  4. Hyperlipidemia- pt. Will continue Crestor.  5. Essential hypertension- pt. Will continue hydralazine, hydrochlorothiazide.  6. GERD- pt. Will cont. Her Omeprazole.   7. Depression- pt. Will continue Zoloft.     DISCHARGE CONDITIONS:   Stable  CONSULTS OBTAINED:    DRUG  ALLERGIES:   Allergies  Allergen Reactions  . Peanuts [Peanut Oil] Shortness Of Breath  . Lasix [Furosemide] Swelling    Tongue swelling  . Lisinopril Swelling    Tongue swelling    DISCHARGE MEDICATIONS:   Allergies as of 01/13/2018      Reactions   Peanuts [peanut Oil] Shortness Of Breath   Lasix [furosemide] Swelling   Tongue swelling   Lisinopril Swelling   Tongue swelling      Medication List    STOP taking these medications   glimepiride 4 MG tablet Commonly known as:  AMARYL     TAKE these medications   ACCU-CHEK AVIVA PLUS test strip Generic drug:  glucose blood   ACCU-CHEK AVIVA Soln   ASPIRIN LOW DOSE 81 MG EC tablet Generic drug:  aspirin Take 1 tablet by mouth daily.   ASSURE COMFORT LANCETS 30G Misc   CALCIUM 600/VITAMIN D 600-400 MG-UNIT Tabs Generic drug:  Calcium Carbonate-Vitamin D3 Take 1 tablet by mouth 2 (two) times daily.   cetirizine 10 MG tablet Commonly known as:  ZYRTEC Take 10 mg by mouth every evening.   ferrous sulfate 325 (65 FE) MG EC tablet Take 325 mg by mouth daily.   fluticasone 50 MCG/ACT nasal spray Commonly known as:  FLONASE Place 2 sprays into both nostrils daily.   hydrALAZINE 25 MG tablet Commonly known as:  APRESOLINE Take 1 tablet by mouth 2 (two) times daily.   hydrochlorothiazide 25 MG tablet Commonly known as:  HYDRODIURIL Take 1 tablet by mouth daily.   insulin aspart 100 UNIT/ML injection Commonly known as:  NOVOLOG 1-150 - no coverage.  151-200 - 2 units 201-250 4 units 251-300 - 6 units 301-350 - 8 units 351-400 - 10 units and call MD.   ipratropium 0.03 % nasal spray Commonly known as:  ATROVENT Place 2 sprays into both nostrils 3 (three) times daily as needed for rhinitis.   omeprazole 40 MG capsule Commonly known as:  PRILOSEC Take 1 capsule by mouth daily.   potassium chloride 10 MEQ tablet Commonly known as:  K-DUR Take 10 mEq by mouth daily.   rosuvastatin 20 MG tablet Commonly  known as:  CRESTOR Take 20 mg by mouth daily.   sertraline 100 MG tablet Commonly known as:  ZOLOFT Take 100 mg by mouth daily.   sotalol 80 MG tablet Commonly known as:  BETAPACE 80 mg 2 (two) times daily.   traMADol 50 MG tablet Commonly known as:  ULTRAM Take 1 tablet (50 mg total) by mouth every 6 (six) hours as needed. What changed:  when to take this         DISCHARGE INSTRUCTIONS:   DIET:  Cardiac diet and Diabetic diet  DISCHARGE CONDITION:  Stable  ACTIVITY:  Activity as tolerated  OXYGEN:  Home Oxygen: No.   Oxygen Delivery: room air  DISCHARGE LOCATION:  Group Home with Home Health PT.    If you experience worsening of your admission symptoms, develop shortness of breath, life threatening emergency, suicidal or homicidal thoughts you must seek medical attention immediately by calling 911 or calling your MD immediately  if symptoms less severe.  You Must read complete instructions/literature along with all the possible adverse reactions/side effects for all the Medicines you take and that have been prescribed to you. Take any new Medicines after you have completely understood and accpet all the possible adverse reactions/side effects.   Please note  You were cared for by a hospitalist during your hospital stay. If you have any questions about your discharge medications or the care you received while you were in the hospital after you are discharged, you can call the unit and asked to speak with the hospitalist on call if the hospitalist that took care of you is not available. Once you are discharged, your primary care physician will handle any further medical issues. Please note that NO REFILLS for any discharge medications will be authorized once you are discharged, as it is imperative that you return to your primary care physician (or establish a relationship with a primary care physician if you do not have one) for your aftercare needs so that they can  reassess your need for medications and monitor your lab values.     Today   BS stable and much improved.  Mental Status back to baseline. Will d/c back to Group home with Home Health services.   VITAL SIGNS:  Blood pressure (!) 138/53, pulse 72, temperature 99.1 F (37.3 C), temperature source Oral, resp. rate 16, height 5\' 9"  (1.753 m), weight 103.9 kg (229 lb), SpO2 98 %.  I/O:    Intake/Output Summary (Last 24 hours) at 01/13/2018 1229 Last data filed at 01/13/2018 0945 Gross per 24 hour  Intake 1980.84 ml  Output 600 ml  Net 1380.84 ml    PHYSICAL EXAMINATION:  GENERAL:  72 y.o.-year-old obese patient lying in the bed with no acute distress.  EYES: Pupils equal, round, reactive to light and accommodation. No scleral icterus. Extraocular muscles intact.  HEENT: Head atraumatic, normocephalic. Oropharynx and nasopharynx clear.  NECK:  Supple, no jugular venous distention. No thyroid enlargement, no  tenderness.  LUNGS: Normal breath sounds bilaterally, no wheezing, rales,rhonchi. No use of accessory muscles of respiration.  CARDIOVASCULAR: S1, S2 normal. No murmurs, rubs, or gallops.  ABDOMEN: Soft, non-tender, non-distended. Bowel sounds present. No organomegaly or mass.  EXTREMITIES: No pedal edema, cyanosis, or clubbing.  NEUROLOGIC: Cranial nerves II through XII are intact. No focal motor or sensory defecits b/l. Globally weak. PSYCHIATRIC: The patient is alert and oriented x 3.   SKIN: No obvious rash, lesion, or ulcer.   DATA REVIEW:   CBC Recent Labs  Lab 01/12/18 0821  WBC 4.4  HGB 12.0  HCT 37.9  PLT 213    Chemistries  Recent Labs  Lab 01/12/18 0821 01/13/18 0353  NA 142 141  K 3.5 3.7  CL 104 102  CO2 29 28  GLUCOSE 80 214*  BUN 16 17  CREATININE 1.13* 1.16*  CALCIUM 9.0 8.7*  AST 28  --   ALT 22  --   ALKPHOS 56  --   BILITOT 0.5  --     Cardiac Enzymes Recent Labs  Lab 01/11/18 1307  TROPONINI 0.03*    Microbiology Results   Results for orders placed or performed during the hospital encounter of 01/12/18  MRSA PCR Screening     Status: None   Collection Time: 01/12/18  6:17 PM  Result Value Ref Range Status   MRSA by PCR NEGATIVE NEGATIVE Final    Comment:        The GeneXpert MRSA Assay (FDA approved for NASAL specimens only), is one component of a comprehensive MRSA colonization surveillance program. It is not intended to diagnose MRSA infection nor to guide or monitor treatment for MRSA infections. Performed at [facility], [address] [place], [place] [address]     RADIOLOGY:  No results found.    Management plans discussed with the patient, family and they are in agreement.  CODE STATUS:     Code Status Orders  (From admission, onward)        Start     Ordered   01/12/18 1118  Full code  Continuous     01/12/18 1118    Code Status History    Date Active Date Inactive Code Status Order ID Comments User Context   This patient has a current code status but no historical code status.      TOTAL TIME TAKING CARE OF THIS PATIENT: 40 minutes.    Virginia SirenSAINANI,VIVEK J M.D on 01/13/2018 at 12:29 PM  Between 7am to 6pm - Pager - [phone]  After 6pm go to [email or web address] - Social research officer, governmentpassword EPAS [facility]  [employer] [place] [facility]  Office  [phone]  CC: Primary care physician; McLean-Scocuzza, Pasty Spillersracy N, MD

## 2018-01-13 NOTE — Care Management Obs Status (Signed)
MEDICARE OBSERVATION STATUS NOTIFICATION   Patient Details  Name: Virginia Barnes MRN: [id number] Date of Birth: 1946/02/17   Medicare Observation Status Notification Given:  Yes    Collie Siadngela Eknoor [place]ack, RN 01/13/2018, 10:26 AM

## 2018-01-13 NOTE — Care Management (Signed)
RNCM followed up with patient regarding home health and she would like [facility]. She states that she doesn't feel like the facility was providing her medications correctly. MD paged for home health order. [facility]. No other RNCM needs.

## 2018-01-13 NOTE — Care Management Note (Signed)
Case Management Note  Patient Details  Name: Virginia Barnes MRN: [id number] Date of Birth: 1946-06-05  Subjective/Objective:                   RNCM met with patient to discuss transition of care and [place] letter. She states she lives at [place]. She states she uses a rollator at baseline to ambulate and usually able to ambulate to dining area. She has history of using [facility] home [facility] and "they didn't show up when they said they would".  Her PCP is Dr. Humphrey Rolls.  Action/Plan:   Home health list provided.  Expected Discharge Date:                  Expected Discharge Plan:     In-House Referral:  Clinical Social Work  Discharge planning Services  CM Consult  Post Acute Care Choice:  Home Health Choice offered to:  Patient  DME Arranged:    DME Agency:     HH Arranged:    [place] Agency:     Status of Service:  In process, will continue to follow  If discussed at Long Length of Stay Meetings, dates discussed:    Additional Comments:  Marshell Garfinkel, RN 01/13/2018, 12:10 PM

## 2018-01-15 ENCOUNTER — Telehealth: Payer: Self-pay | Admitting: Internal Medicine

## 2018-01-15 NOTE — Telephone Encounter (Signed)
Tried to reach patient by phone on 01/14/18 voicemail full unable to leave message , second attempt made today no answer.

## 2018-01-30 ENCOUNTER — Other Ambulatory Visit: Payer: Self-pay

## 2018-01-30 ENCOUNTER — Encounter: Payer: Self-pay | Admitting: Internal Medicine

## 2018-01-30 ENCOUNTER — Inpatient Hospital Stay: Payer: Medicare HMO

## 2018-01-30 ENCOUNTER — Inpatient Hospital Stay
Admission: EM | Admit: 2018-01-30 | Discharge: 2018-02-06 | DRG: 871 | Disposition: A | Discharge status: Skilled Nursing Facility | Payer: Medicare HMO | Attending: Family Medicine | Admitting: Family Medicine

## 2018-01-30 ENCOUNTER — Emergency Department: Payer: Medicare HMO

## 2018-01-30 DIAGNOSIS — G9341 Metabolic encephalopathy: Secondary | ICD-10-CM | POA: Diagnosis present

## 2018-01-30 DIAGNOSIS — I5033 Acute on chronic diastolic (congestive) heart failure: Secondary | ICD-10-CM | POA: Diagnosis present

## 2018-01-30 DIAGNOSIS — I5031 Acute diastolic (congestive) heart failure: Secondary | ICD-10-CM | POA: Diagnosis not present

## 2018-01-30 DIAGNOSIS — A419 Sepsis, unspecified organism: Principal | ICD-10-CM | POA: Diagnosis present

## 2018-01-30 DIAGNOSIS — J9601 Acute respiratory failure with hypoxia: Secondary | ICD-10-CM | POA: Diagnosis present

## 2018-01-30 DIAGNOSIS — I248 Other forms of acute ischemic heart disease: Secondary | ICD-10-CM | POA: Diagnosis present

## 2018-01-30 DIAGNOSIS — K5900 Constipation, unspecified: Secondary | ICD-10-CM | POA: Diagnosis not present

## 2018-01-30 DIAGNOSIS — I110 Hypertensive heart disease with heart failure: Secondary | ICD-10-CM | POA: Diagnosis present

## 2018-01-30 DIAGNOSIS — Z7982 Long term (current) use of aspirin: Secondary | ICD-10-CM

## 2018-01-30 DIAGNOSIS — D649 Anemia, unspecified: Secondary | ICD-10-CM | POA: Diagnosis present

## 2018-01-30 DIAGNOSIS — E876 Hypokalemia: Secondary | ICD-10-CM | POA: Diagnosis not present

## 2018-01-30 DIAGNOSIS — E119 Type 2 diabetes mellitus without complications: Secondary | ICD-10-CM | POA: Diagnosis present

## 2018-01-30 DIAGNOSIS — Z794 Long term (current) use of insulin: Secondary | ICD-10-CM | POA: Diagnosis not present

## 2018-01-30 DIAGNOSIS — Z87891 Personal history of nicotine dependence: Secondary | ICD-10-CM

## 2018-01-30 DIAGNOSIS — J9811 Atelectasis: Secondary | ICD-10-CM | POA: Diagnosis present

## 2018-01-30 DIAGNOSIS — E872 Acidosis: Secondary | ICD-10-CM | POA: Diagnosis present

## 2018-01-30 DIAGNOSIS — R6521 Severe sepsis with septic shock: Secondary | ICD-10-CM | POA: Diagnosis present

## 2018-01-30 DIAGNOSIS — Z888 Allergy status to other drugs, medicaments and biological substances status: Secondary | ICD-10-CM | POA: Diagnosis not present

## 2018-01-30 DIAGNOSIS — K219 Gastro-esophageal reflux disease without esophagitis: Secondary | ICD-10-CM | POA: Diagnosis present

## 2018-01-30 DIAGNOSIS — J189 Pneumonia, unspecified organism: Secondary | ICD-10-CM | POA: Diagnosis present

## 2018-01-30 DIAGNOSIS — A0811 Acute gastroenteropathy due to Norwalk agent: Secondary | ICD-10-CM | POA: Diagnosis present

## 2018-01-30 DIAGNOSIS — J9600 Acute respiratory failure, unspecified whether with hypoxia or hypercapnia: Secondary | ICD-10-CM

## 2018-01-30 DIAGNOSIS — R6520 Severe sepsis without septic shock: Secondary | ICD-10-CM

## 2018-01-30 DIAGNOSIS — J9602 Acute respiratory failure with hypercapnia: Secondary | ICD-10-CM | POA: Diagnosis present

## 2018-01-30 DIAGNOSIS — N179 Acute kidney failure, unspecified: Secondary | ICD-10-CM | POA: Diagnosis present

## 2018-01-30 DIAGNOSIS — Z79899 Other long term (current) drug therapy: Secondary | ICD-10-CM | POA: Diagnosis not present

## 2018-01-30 DIAGNOSIS — Y95 Nosocomial condition: Secondary | ICD-10-CM | POA: Diagnosis present

## 2018-01-30 DIAGNOSIS — J9690 Respiratory failure, unspecified, unspecified whether with hypoxia or hypercapnia: Secondary | ICD-10-CM

## 2018-01-30 DIAGNOSIS — R0603 Acute respiratory distress: Secondary | ICD-10-CM

## 2018-01-30 LAB — PROCALCITONIN
Procalcitonin: <0.1 ng/mL
Procalcitonin: 3.74 ng/mL

## 2018-01-30 LAB — COMPREHENSIVE METABOLIC PANEL
ALBUMIN: 3.4 g/dL — AB (ref 3.5–5.0)
ALT: 30 U/L (ref 14–54)
AST: 46 U/L — AB (ref 15–41)
Alkaline Phosphatase: 56 U/L (ref 38–126)
Anion gap: 11 (ref 5–15)
BUN: 14 mg/dL (ref 6–20)
CHLORIDE: 102 mmol/L (ref 101–111)
CO2: 23 mmol/L (ref 22–32)
CREATININE: 0.93 mg/dL (ref 0.44–1.00)
Calcium: 8.5 mg/dL — ABNORMAL LOW (ref 8.9–10.3)
GFR calc Af Amer: >60 mL/min (ref >60)
GFR calc non Af Amer: 60 mL/min — ABNORMAL LOW (ref >60)
Glucose, Bld: 287 mg/dL — ABNORMAL HIGH (ref 65–99)
POTASSIUM: 4.6 mmol/L (ref 3.5–5.1)
Sodium: 136 mmol/L (ref 135–145)
Total Bilirubin: 1.2 mg/dL (ref 0.3–1.2)
Total Protein: 7.2 g/dL (ref 6.5–8.1)

## 2018-01-30 LAB — BLOOD GAS, ARTERIAL
Acid-base deficit: 1.7 mmol/L (ref 0.0–2.0)
BICARBONATE: 25.9 mmol/L (ref 20.0–28.0)
FIO2: 0.8
LHR: 16 {breaths}/min
O2 SAT: 94.8 %
PEEP: 5 cmH2O
Patient temperature: 37
VT: 400 mL
pCO2 arterial: 59 mmHg — ABNORMAL HIGH (ref 32.0–48.0)
pH, Arterial: 7.25 — ABNORMAL LOW (ref 7.350–7.450)
pO2, Arterial: 86 mmHg (ref 83.0–108.0)

## 2018-01-30 LAB — URINALYSIS, ROUTINE W REFLEX MICROSCOPIC
Bilirubin Urine: NEGATIVE
Glucose, UA: NEGATIVE mg/dL
HGB URINE DIPSTICK: NEGATIVE
Ketones, ur: NEGATIVE mg/dL
Leukocytes, UA: NEGATIVE
Nitrite: NEGATIVE
PH: 7 (ref 5.0–8.0)
Protein, ur: NEGATIVE mg/dL
SPECIFIC GRAVITY, URINE: 1.015 (ref 1.005–1.030)

## 2018-01-30 LAB — CBC WITH DIFFERENTIAL/PLATELET
BASOS PCT: 1 %
Basophils Absolute: 0.1 10*3/uL (ref 0–0.1)
Eosinophils Absolute: 0 10*3/uL (ref 0–0.7)
Eosinophils Relative: 0 %
HEMATOCRIT: 34.4 % — AB (ref 35.0–47.0)
HEMOGLOBIN: 11 g/dL — AB (ref 12.0–16.0)
LYMPHS ABS: 1.5 10*3/uL (ref 1.0–3.6)
Lymphocytes Relative: 18 %
MCH: 27.8 pg (ref 26.0–34.0)
MCHC: 32 g/dL (ref 32.0–36.0)
MCV: 86.8 fL (ref 80.0–100.0)
MONO ABS: 0.9 10*3/uL (ref 0.2–0.9)
MONOS PCT: 10 %
NEUTROS PCT: 71 %
Neutro Abs: 6.1 10*3/uL (ref 1.4–6.5)
Platelets: 382 10*3/uL (ref 150–440)
RBC: 3.96 MIL/uL (ref 3.80–5.20)
RDW: 14.5 % (ref 11.5–14.5)
WBC: 8.6 10*3/uL (ref 3.6–11.0)

## 2018-01-30 LAB — PROTIME-INR
INR: 1.19
Prothrombin Time: 15 seconds (ref 11.4–15.2)

## 2018-01-30 LAB — INFLUENZA PANEL BY PCR (TYPE A & B)
INFLAPCR: NEGATIVE
INFLBPCR: NEGATIVE

## 2018-01-30 LAB — LACTIC ACID, PLASMA
LACTIC ACID, VENOUS: 3.7 mmol/L — AB (ref 0.5–1.9)
LACTIC ACID, VENOUS: 0.9 mmol/L (ref 0.5–1.9)

## 2018-01-30 LAB — LIPASE, BLOOD: LIPASE: 33 U/L (ref 11–51)

## 2018-01-30 LAB — TROPONIN I: Troponin I: 0.06 ng/mL (ref <0.03)

## 2018-01-30 LAB — GLUCOSE, CAPILLARY
GLUCOSE-CAPILLARY: 165 mg/dL — AB (ref 65–99)
GLUCOSE-CAPILLARY: 160 mg/dL — AB (ref 65–99)

## 2018-01-30 LAB — MRSA PCR SCREENING: MRSA BY PCR: NEGATIVE

## 2018-01-30 MED ORDER — SODIUM CHLORIDE 0.9 % IV BOLUS (SEPSIS)
1000.0000 mL | Freq: Once | INTRAVENOUS | Status: AC
  Administered 2018-01-30 (×2): 1000 mL via INTRAVENOUS

## 2018-01-30 MED ORDER — PROPOFOL 1000 MG/100ML IV EMUL
INTRAVENOUS | Status: AC
  Administered 2018-01-30: 20 ug/kg/min via INTRAVENOUS
  Filled 2018-01-30: qty 100

## 2018-01-30 MED ORDER — ACETAMINOPHEN 325 MG PO TABS: 650.0000 mg | ORAL_TABLET | Freq: Four times a day (QID) | ORAL | Status: DC | PRN

## 2018-01-30 MED ORDER — SODIUM CHLORIDE 0.9 % IV BOLUS (SEPSIS)
30.0000 mL/kg | Freq: Once | INTRAVENOUS | Status: AC
  Administered 2018-01-30: 3117 mL via INTRAVENOUS

## 2018-01-30 MED ORDER — SUCCINYLCHOLINE CHLORIDE 20 MG/ML IJ SOLN
INTRAMUSCULAR | Status: AC | PRN
  Administered 2018-01-30: 160 mg via INTRAVENOUS

## 2018-01-30 MED ORDER — PROPOFOL 1000 MG/100ML IV EMUL
20.0000 ug/kg/min | Freq: Once | INTRAVENOUS | Status: DC
  Administered 2018-01-30: 20 ug/kg/min via INTRAVENOUS

## 2018-01-30 MED ORDER — SENNOSIDES 8.8 MG/5ML PO SYRP
5.0000 mL | ORAL_SOLUTION | Freq: Two times a day (BID) | ORAL | Status: DC | PRN
  Filled 2018-01-30: qty 5

## 2018-01-30 MED ORDER — CHLORHEXIDINE GLUCONATE 0.12% ORAL RINSE (MEDLINE KIT)
15.0000 mL | Freq: Two times a day (BID) | OROMUCOSAL | Status: DC
  Administered 2018-01-30 – 2018-02-02 (×6): 15 mL via OROMUCOSAL

## 2018-01-30 MED ORDER — PROPOFOL 1000 MG/100ML IV EMUL: 5.0000 ug/kg/min | INTRAVENOUS | Status: DC

## 2018-01-30 MED ORDER — SODIUM CHLORIDE 0.9 % IV SOLN
100.0000 ug/h | INTRAVENOUS | Status: DC
  Administered 2018-01-30: 100 ug/h via INTRAVENOUS
  Filled 2018-01-30: qty 50

## 2018-01-30 MED ORDER — ENOXAPARIN SODIUM 40 MG/0.4ML ~~LOC~~ SOLN
40.0000 mg | SUBCUTANEOUS | Status: DC
  Administered 2018-01-30 – 2018-02-05 (×7): 40 mg via SUBCUTANEOUS
  Filled 2018-01-30 (×7): qty 0.4

## 2018-01-30 MED ORDER — FENTANYL 2500MCG IN NS 250ML (10MCG/ML) PREMIX INFUSION
0.0000 ug/h | INTRAVENOUS | Status: DC
  Administered 2018-01-30: 100 ug/h via INTRAVENOUS
  Administered 2018-01-31 – 2018-02-01 (×2): 150 ug/h via INTRAVENOUS
  Filled 2018-01-30 (×2): qty 250

## 2018-01-30 MED ORDER — PIPERACILLIN-TAZOBACTAM 3.375 G IVPB
3.3750 g | Freq: Three times a day (TID) | INTRAVENOUS | Status: AC
  Administered 2018-01-30 – 2018-02-04 (×16): 3.375 g via INTRAVENOUS
  Filled 2018-01-30 (×16): qty 50

## 2018-01-30 MED ORDER — ONDANSETRON HCL 4 MG/2ML IJ SOLN: 4.0000 mg | Freq: Four times a day (QID) | INTRAMUSCULAR | Status: DC | PRN

## 2018-01-30 MED ORDER — SODIUM CHLORIDE 0.9 % IV SOLN: 2.0000 g | Freq: Three times a day (TID) | INTRAVENOUS | Status: DC

## 2018-01-30 MED ORDER — HYDROMORPHONE HCL 2 MG/ML IJ SOLN
4.0000 mg | Freq: Once | INTRAMUSCULAR | Status: DC
Start: 2018-01-30 — End: 2018-01-30

## 2018-01-30 MED ORDER — DEXTROSE 5 % IV SOLN
0.0000 ug/min | INTRAVENOUS | Status: DC
  Administered 2018-01-30: 2 ug/min via INTRAVENOUS
  Filled 2018-01-30: qty 4

## 2018-01-30 MED ORDER — PIPERACILLIN-TAZOBACTAM 3.375 G IVPB 30 MIN
3.3750 g | Freq: Once | INTRAVENOUS | Status: AC
  Administered 2018-01-30: 3.375 g via INTRAVENOUS
  Filled 2018-01-30: qty 50

## 2018-01-30 MED ORDER — ACETAMINOPHEN 650 MG RE SUPP: 650.0000 mg | Freq: Four times a day (QID) | RECTAL | Status: DC | PRN

## 2018-01-30 MED ORDER — INSULIN ASPART 100 UNIT/ML ~~LOC~~ SOLN: 0.0000 [IU] | Freq: Every day | SUBCUTANEOUS | Status: DC

## 2018-01-30 MED ORDER — PANTOPRAZOLE SODIUM 40 MG IV SOLR
40.0000 mg | INTRAVENOUS | Status: DC
  Administered 2018-01-30: 40 mg via INTRAVENOUS
  Filled 2018-01-30: qty 40

## 2018-01-30 MED ORDER — VANCOMYCIN HCL IN DEXTROSE 1-5 GM/200ML-% IV SOLN
1000.0000 mg | Freq: Once | INTRAVENOUS | Status: AC
  Administered 2018-01-30: 1000 mg via INTRAVENOUS
  Filled 2018-01-30: qty 200

## 2018-01-30 MED ORDER — HYDROMORPHONE HCL 1 MG/ML IJ SOLN
INTRAMUSCULAR | Status: AC
  Filled 2018-01-30: qty 4

## 2018-01-30 MED ORDER — VANCOMYCIN HCL IN DEXTROSE 1-5 GM/200ML-% IV SOLN: 1000.0000 mg | Freq: Once | INTRAVENOUS | Status: DC

## 2018-01-30 MED ORDER — VANCOMYCIN HCL IN DEXTROSE 1-5 GM/200ML-% IV SOLN: 1000.0000 mg | Freq: Two times a day (BID) | INTRAVENOUS | Status: DC

## 2018-01-30 MED ORDER — NOREPINEPHRINE BITARTRATE 1 MG/ML IV SOLN
0.0000 ug/min | INTRAVENOUS | Status: DC
  Administered 2018-01-30: 2 ug/min via INTRAVENOUS
  Filled 2018-01-30: qty 4

## 2018-01-30 MED ORDER — KETAMINE HCL 10 MG/ML IJ SOLN
INTRAMUSCULAR | Status: AC | PRN
  Administered 2018-01-30: 100 mg via INTRAVENOUS

## 2018-01-30 MED ORDER — MIDAZOLAM HCL 2 MG/2ML IJ SOLN: 1.0000 mg | INTRAMUSCULAR | Status: DC | PRN

## 2018-01-30 MED ORDER — INSULIN ASPART 100 UNIT/ML ~~LOC~~ SOLN
0.0000 [IU] | Freq: Three times a day (TID) | SUBCUTANEOUS | Status: DC
  Administered 2018-01-31: 3 [IU] via SUBCUTANEOUS
  Filled 2018-01-30: qty 1

## 2018-01-30 MED ORDER — LACTATED RINGERS IV SOLN
INTRAVENOUS | Status: DC
  Administered 2018-01-30: 22:00:00 via INTRAVENOUS

## 2018-01-30 MED ORDER — PIPERACILLIN-TAZOBACTAM 3.375 G IVPB 30 MIN: 3.3750 g | Freq: Once | INTRAVENOUS | Status: DC

## 2018-01-30 MED ORDER — ONDANSETRON HCL 4 MG PO TABS: 4.0000 mg | ORAL_TABLET | Freq: Four times a day (QID) | ORAL | Status: DC | PRN

## 2018-01-30 MED ORDER — ACETAMINOPHEN 10 MG/ML IV SOLN
1000.0000 mg | Freq: Once | INTRAVENOUS | Status: DC
  Filled 2018-01-30: qty 100

## 2018-01-30 MED ORDER — OSELTAMIVIR PHOSPHATE 75 MG PO CAPS: 75.0000 mg | ORAL_CAPSULE | Freq: Once | ORAL | Status: DC

## 2018-01-30 MED ORDER — ORAL CARE MOUTH RINSE
15.0000 mL | OROMUCOSAL | Status: DC
  Administered 2018-01-30 – 2018-02-02 (×30): 15 mL via OROMUCOSAL

## 2018-01-30 MED ORDER — SODIUM CHLORIDE 0.9 % IV SOLN: INTRAVENOUS | Status: DC

## 2018-01-30 MED ORDER — FENTANYL BOLUS VIA INFUSION
25.0000 ug | INTRAVENOUS | Status: DC | PRN
  Administered 2018-01-31 (×5): 25 ug via INTRAVENOUS
  Filled 2018-01-30: qty 25

## 2018-01-30 MED ORDER — FENTANYL CITRATE (PF) 100 MCG/2ML IJ SOLN
50.0000 ug | Freq: Once | INTRAMUSCULAR | Status: AC
  Administered 2018-01-30: 50 ug via INTRAVENOUS

## 2018-01-30 NOTE — Sedation Documentation (Signed)
ETT 7.5, 22 CM @ LIP, POSITIVE COLORMETRY, POSITIVE BREATH SOUNDS BILATERALLY PER AUSCULTATION. EDP VISUALIZED PURULENT DRAINAGE FROM BETWEEN VOCAL CORDS DURING INTUBATION.

## 2018-01-30 NOTE — Consult Note (Signed)
PULMONARY / CRITICAL CARE MEDICINE   Name: Virginia Barnes MRN: [id number] DOB: 09-29-1946    ADMISSION DATE:  01/30/2018 CONSULTATION DATE: 01/30/2018  REFERRING MD: Dr. Estanislado Pandy  CHIEF COMPLAINT: Fever  HISTORY OF PRESENT ILLNESS:   This is a 72 yo female with a PMH of HTN, Diverticulitis, Diabetes Mellitus, and CHF.  She presented to [facility] ER from [facility] on 02/28 with fever temp 101.9 F and altered mental status.  Upon arrival to the ER she was hypoxic on 4L O2 with minimal responsiveness and hypotension bp 64/45.  Therefore, she ruled in for sepsis receiving IV fluid bolus however she remained hypotensive and levophed gtt started.  CT Head negative and CXR revealed multifocal pneumonia.  She required mechanical intubation for airway protection.  She was subsequently admitted to ICU by hospitalist team for further workup and treatment PCCM consulted for vent management.    PAST MEDICAL HISTORY :  She  has a past medical history of CHF (congestive heart failure) ([place]), Diabetes mellitus without complication ([place]), Diverticulitis, and Hypertension.  PAST SURGICAL HISTORY: She  has a past surgical history that includes Abdominal hysterectomy; Joint replacement; and Cardiac catheterization (Left, 04/13/2016).  Allergies  Allergen Reactions  . Peanuts [Peanut Oil] Shortness Of Breath  . Lasix [Furosemide] Swelling    Tongue swelling  . Lisinopril Swelling    Tongue swelling    No current facility-administered medications on file prior to encounter.    Current Outpatient Medications on File Prior to Encounter  Medication Sig  . ASPIRIN LOW DOSE 81 MG EC tablet Take 1 tablet by mouth daily.  . Calcium Carbonate-Vitamin D3 (CALCIUM 600/VITAMIN D) 600-400 MG-UNIT TABS Take 1 tablet by mouth 2 (two) times daily.  . cetirizine (ZYRTEC) 10 MG tablet Take 10 mg by mouth every evening.  . clotrimazole (LOTRIMIN) 1 % cream Apply 1 application topically 2 (two) times daily.  . ferrous  sulfate 325 (65 FE) MG EC tablet Take 325 mg by mouth daily.  . fluticasone (FLONASE) 50 MCG/ACT nasal spray Place 2 sprays into both nostrils daily.  . hydrALAZINE (APRESOLINE) 25 MG tablet Take 1 tablet by mouth 2 (two) times daily.  . hydrochlorothiazide (HYDRODIURIL) 25 MG tablet Take 1 tablet by mouth daily.  . insulin aspart (NOVOLOG) 100 UNIT/ML injection 1-150 - no coverage.  151-200 - 2 units 201-250 4 units 251-300 - 6 units 301-350 - 8 units 351-400 - 10 units and call MD. (Patient taking differently: Inject 0-10 Units into the skin 3 (three) times daily with meals. )  . ipratropium (ATROVENT) 0.03 % nasal spray Place 2 sprays into both nostrils 3 (three) times daily as needed for rhinitis.  Marland Kitchen omeprazole (PRILOSEC) 40 MG capsule Take 1 capsule by mouth daily.  . potassium chloride (K-DUR) 10 MEQ tablet Take 10 mEq by mouth daily.   . rosuvastatin (CRESTOR) 20 MG tablet Take 20 mg by mouth daily.  . sertraline (ZOLOFT) 100 MG tablet Take 100 mg by mouth daily.  . sotalol (BETAPACE) 80 MG tablet 80 mg 2 (two) times daily.   Marland Kitchen ACCU-CHEK AVIVA PLUS test strip   . ASSURE COMFORT LANCETS 30G MISC   . Blood Glucose Calibration (ACCU-CHEK AVIVA) SOLN   . traMADol (ULTRAM) 50 MG tablet Take 1 tablet (50 mg total) by mouth every 6 (six) hours as needed. (Patient taking differently: Take 50 mg by mouth 2 (two) times daily as needed. )    FAMILY HISTORY:  Her indicated that her mother is  deceased. She indicated that her father is deceased. She indicated that the status of her neg hx is unknown.   SOCIAL HISTORY: She  reports that she has quit smoking. Her smoking use included cigarettes. She has a 7.50 pack-year smoking history. she has never used smokeless tobacco. She reports that she does not drink alcohol or use drugs.  REVIEW OF SYSTEMS:   Unable to assess pt intubated   SUBJECTIVE:  Unable to assess pt intubated   VITAL SIGNS: BP 111/62   Pulse 71   Resp 16   SpO2 100%    HEMODYNAMICS:    VENTILATOR SETTINGS: Vent Mode: AC FiO2 (%):  [60 %-80 %] 80 % Set Rate:  [16 bmp] 16 bmp Vt Set:  [400 mL] 400 mL PEEP:  [5 cmH20] 5 cmH20  INTAKE / OUTPUT: No intake/output data recorded.  PHYSICAL EXAMINATION: General: acutely ill appearing female, NAD mechanically intubated  Neuro: following commands, PERRL  HEENT: supple, no JVD  Cardiovascular: nsr, no M/R/G Lungs: rhonchi throughout, even, non labored  Abdomen: +BS x4, obese, soft, non distended Musculoskeletal: normal tone, 2+ bilateral lower extremity edema  Skin: intact no rashes or lesions   LABS:  BMET Recent Labs  Lab 01/30/18 1717  NA 136  K 4.6  CL 102  CO2 23  BUN 14  CREATININE 0.93  GLUCOSE 287*    Electrolytes Recent Labs  Lab 01/30/18 1717  CALCIUM 8.5*    CBC Recent Labs  Lab 01/30/18 1717  WBC 8.6  HGB 11.0*  HCT 34.4*  PLT 382    Coag's Recent Labs  Lab 01/30/18 1812  INR 1.19    Sepsis Markers Recent Labs  Lab 01/30/18 1717  LATICACIDVEN 3.7*  PROCALCITON <0.10    ABG Recent Labs  Lab 01/30/18 1813  PHART 7.25*  PCO2ART 59*  PO2ART 86    Liver Enzymes Recent Labs  Lab 01/30/18 1717  AST 46*  ALT 30  ALKPHOS 56  BILITOT 1.2  ALBUMIN 3.4*    Cardiac Enzymes Recent Labs  Lab 01/30/18 1717  TROPONINI 0.06*    Glucose No results for input(s): GLUCAP in the last 168 hours.  Imaging Dg Chest Port 1 View  Result Date: 01/30/2018 CLINICAL DATA:  Short of breath EXAM: PORTABLE CHEST 1 VIEW COMPARISON:  01/11/2018, 11/11/2015 FINDINGS: Endotracheal tube tip projects 3.9 cm superior to the carina. Esophageal tube tip is below the diaphragm but is not included. Cardiomegaly. Development of diffuse airspace disease in the right thorax with consolidation at the left base and suspected small left effusion. Aortic atherosclerosis. No pneumothorax. IMPRESSION: 1. Endotracheal tube tip about 3.9 cm superior to carina. Esophageal tube tip  is below the diaphragm but not included on the image 2. Extensive airspace disease in the right hemithorax and left lung base suspicious for multifocal pneumonia. Suspect that there is a small left effusion 3. Cardiomegaly Electronically Signed   By: Donavan Foil M.D.   On: 01/30/2018 18:42   CULTURES: Blood x2 02/28>> Urine 02/28>> Respiratory 02/28>>  ANTIBIOTICS: Zosyn 02/28>> Vancomycin x1 dose 02/28  SIGNIFICANT EVENTS/TEST RESULTS: 02/28-Pt admitted to ICU mechanically intubated with septic shock secondary to multifocal pneumonia  02/28 CT Head-No acute finding by CT. Atrophy and chronic small-vessel change of the white matter.  LINES/TUBES: ETT 02/28>> Right femoral CVL 02/28>>  ASSESSMENT / PLAN:  PULMONARY A: Acute hypoxic hypercapnic respiratory failure secondary to multifocal pneumonia  Mechanical Intubation  P:   Full vent support vent settings reviewed and established  SBT once all parameters met  Repeat CXR in am  Scheduled bronchodilator therapy  VAP bundle   CARDIOVASCULAR A:  Hypotension secondary to sepsis  Elevated troponin likely secondary to demand ischemia in setting of respiratory failure  Hx: CHF  P:  Trend troponin's  Continuous telemetry monitoring  Prn levophed gtt to maintain map >65   RENAL A:   Lactic acidosis  P:   Trend BMP  Replace electrolytes as indicated  Monitor UOP  Trend lactic acid  LR _0  ml/hr   GASTROINTESTINAL A:   No acute issues  P:   Will start tube feeds  SUP: IV protonix   HEMATOLOGIC A:   Anemia without obvious acute blood loss  P:  Lovenox for VTE prophylaxis  Trend CBC  Monitor for s/sx of bleeding and transfuse for hgb <7   INFECTIOUS A:   Septic shock secondary to multifocal pneumonia  P:   Trend WBC and monitor fever curve  Trend PCT and lactic acid  Follow cultures r/o influenza   Continue abx as listed above   ENDOCRINE A:   Diabetes Mellitus  P:   CBG's q4hrs  SSI    NEUROLOGIC A:   Acute encephalopathy likely secondary to sepsis  P:   RASS goal: 0 to -1 Prn Fentanyl gtt to maintain RASS goal and pain management WUA daily    FAMILY  - Updates: Spoke with pts family regarding plan of care and all questions answered   - Inter-disciplinary family meet or Palliative Care meeting due by: 02/06/2018   Marda Stalker, [place] Pager [phone] (please enter 7 digits) PCCM Consult Pager [phone] (please enter 7 digits)

## 2018-01-30 NOTE — Progress Notes (Signed)
CODE SEPSIS - PHARMACY COMMUNICATION  **Broad Spectrum Antibiotics should be administered within 1 hour of Sepsis diagnosis**  Time Code Sepsis Called/Page Received: 1713  Antibiotics Ordered: vancomycin + piperacillin/tazobactam  Time of 1st antibiotic administration: 1824   Cindi CarbonMary M Mida Cory ,PharmD Clinical Pharmacist  01/30/2018  7:49 PM

## 2018-01-30 NOTE — ED Provider Notes (Signed)
[facility] Emergency Department Provider Note  ____________________________________________   First MD Initiated Contact with Patient 01/30/18 1703     (approximate)  I have reviewed the triage vital signs and the nursing notes.   HISTORY  Chief Complaint Fever  Level 5 exemption history limited by the patient's clinical condition  HPI Virginia Barnes is a 72 y.o. female comes to the emergency department by EMS in respiratory distress.  She comes from her nursing home apparently with cough fever and shortness of breath over the course of the past day.  The patient was hypoxic to the 60s in route and came up only slightly with nonrebreather.  The patient herself is critically ill and is unable to provide any history whatsoever.  Past Medical History:  Diagnosis Date  . CHF (congestive heart failure) ([place])   . Diabetes mellitus without complication ([place])   . Diverticulitis   . Hypertension     Patient Active Problem List   Diagnosis Date Noted  . Sepsis ([place]) 01/30/2018  . Hypoglycemia 01/12/2018  . Knee pain 05/17/2017    Past Surgical History:  Procedure Laterality Date  . ABDOMINAL HYSTERECTOMY    . CARDIAC CATHETERIZATION Left 04/13/2016   Procedure: Left Heart Cath and Coronary Angiography;  Surgeon: Dionisio David, MD;  Location: [place] CV LAB;  Service: Cardiovascular;  Laterality: Left;  . JOINT REPLACEMENT      Prior to Admission medications   Medication Sig Start Date End Date Taking? Authorizing Provider  ASPIRIN LOW DOSE 81 MG EC tablet Take 1 tablet by mouth daily. 09/26/15  Yes [provider]  Calcium Carbonate-Vitamin D3 (CALCIUM 600/VITAMIN D) 600-400 MG-UNIT TABS Take 1 tablet by mouth 2 (two) times daily.   Yes [provider]  cetirizine (ZYRTEC) 10 MG tablet Take 10 mg by mouth every evening.   Yes [provider]  clotrimazole (LOTRIMIN) 1 % cream Apply 1 application topically 2 (two) times  daily.   Yes [provider]  ferrous sulfate 325 (65 FE) MG EC tablet Take 325 mg by mouth daily.   Yes [provider]  fluticasone (FLONASE) 50 MCG/ACT nasal spray Place 2 sprays into both nostrils daily.   Yes [provider]  hydrALAZINE (APRESOLINE) 25 MG tablet Take 1 tablet by mouth 2 (two) times daily. 11/04/15  Yes [provider]  hydrochlorothiazide (HYDRODIURIL) 25 MG tablet Take 1 tablet by mouth daily. 11/04/15  Yes [provider]  insulin aspart (NOVOLOG) 100 UNIT/ML injection 1-150 - no coverage.  151-200 - 2 units 201-250 4 units 251-300 - 6 units 301-350 - 8 units 351-400 - 10 units and call MD. Patient taking differently: Inject 0-10 Units into the skin 3 (three) times daily with meals.  01/13/18  Yes Sainani, Belia Heman, MD  ipratropium (ATROVENT) 0.03 % nasal spray Place 2 sprays into both nostrils 3 (three) times daily as needed for rhinitis.   Yes [provider]  omeprazole (PRILOSEC) 40 MG capsule Take 1 capsule by mouth daily. 09/26/15  Yes [provider]  potassium chloride (K-DUR) 10 MEQ tablet Take 10 mEq by mouth daily.    Yes [provider]  rosuvastatin (CRESTOR) 20 MG tablet Take 20 mg by mouth daily.   Yes [provider]  sertraline (ZOLOFT) 100 MG tablet Take 100 mg by mouth daily.   Yes [provider]  sotalol (BETAPACE) 80 MG tablet 80 mg 2 (two) times daily.    Yes [provider]  ACCU-CHEK AVIVA PLUS test strip  12/26/16   [provider]  ASSURE COMFORT LANCETS 30G MISC  12/26/16   [provider]  Blood Glucose Calibration (ACCU-CHEK AVIVA) SOLN  12/26/16   [provider]  traMADol (ULTRAM) 50 MG tablet Take 1 tablet (50 mg total) by mouth every 6 (six) hours as needed. Patient taking differently: Take 50 mg by mouth 2 (two) times daily as needed.  10/24/17 10/24/18  Gregor Hams, MD    Allergies Peanuts [peanut oil];  Lasix [furosemide]; and Lisinopril  Family History  Problem Relation Age of Onset  . Stomach cancer Sister 7  . Vaginal cancer Sister 69  . Heart disease Father   . Breast cancer Neg Hx     Social History Social History   Tobacco Use  . Smoking status: Former Smoker    Packs/day: 0.50    Years: 15.00    Pack years: 7.50    Types: Cigarettes  . Smokeless tobacco: Never Used  Substance Use Topics  . Alcohol use: No  . Drug use: No    Review of Systems Level 5 exemption history limited by the patient's clinical condition  ____________________________________________   PHYSICAL EXAM:  VITAL SIGNS: ED Triage Vitals  Enc Vitals Group     BP      Pulse      Resp      Temp      Temp src      SpO2      Weight      Height      Head Circumference      Peak Flow      Pain Score      Pain Loc      Pain Edu?      Excl. in [place]?     Constitutional: Appears critically ill diaphoretic elevated respiratory rate using accessory muscles unable to speak Eyes: PERRL EOMI. mid range Head: Atraumatic. Nose: No congestion/rhinnorhea. Mouth/Throat: No trismus Neck: No stridor.   Cardiovascular: Tachycardic rate, regular rhythm. Grossly normal heart sounds.  Good peripheral circulation. Respiratory: Severe respiratory distress using accessory muscles coarse breath sounds throughout Gastrointestinal: Soft nontender Musculoskeletal: Legs are equal in size Neurologic: Localizes all 4 Skin: Diaphoretic Psychiatric: Critically ill and nearly comatose    ____________________________________________   DIFFERENTIAL includes but not limited to  , Pneumonia, pneumothorax, pulmonary embolism, sepsis, septic shock, metabolic derangement ____________________________________________   LABS (all labs ordered are listed, but only abnormal results are displayed)  Labs Reviewed  LACTIC ACID, PLASMA - Abnormal; Notable for the following components:      Result Value   Lactic Acid,  Venous 3.7 (*)    All other components within normal limits  COMPREHENSIVE METABOLIC PANEL - Abnormal; Notable for the following components:   Glucose, Bld 287 (*)    Calcium 8.5 (*)    Albumin 3.4 (*)    AST 46 (*)    GFR calc non Af Amer 60 (*)    All other components within normal limits  TROPONIN I - Abnormal; Notable for the following components:   Troponin I 0.06 (*)    All other components within normal limits  CBC WITH DIFFERENTIAL/PLATELET - Abnormal; Notable for the following components:   Hemoglobin 11.0 (*)    HCT 34.4 (*)    All other components within normal limits  URINALYSIS, ROUTINE W REFLEX MICROSCOPIC - Abnormal; Notable for the following components:   Color, Urine YELLOW (*)  APPearance CLEAR (*)    All other components within normal limits  BLOOD GAS, ARTERIAL - Abnormal; Notable for the following components:   pH, Arterial 7.25 (*)    pCO2 arterial 59 (*)    All other components within normal limits  TROPONIN I - Abnormal; Notable for the following components:   Troponin I 0.37 (*)    All other components within normal limits  TROPONIN I - Abnormal; Notable for the following components:   Troponin I 0.45 (*)    All other components within normal limits  TROPONIN I - Abnormal; Notable for the following components:   Troponin I 0.40 (*)    All other components within normal limits  BASIC METABOLIC PANEL - Abnormal; Notable for the following components:   Potassium 3.4 (*)    Glucose, Bld 176 (*)    Creatinine, Ser 1.02 (*)    Calcium 7.9 (*)    GFR calc non Af Amer 54 (*)    All other components within normal limits  CBC - Abnormal; Notable for the following components:   RBC 3.18 (*)    Hemoglobin 9.0 (*)    HCT 27.2 (*)    RDW 14.6 (*)    All other components within normal limits  GLUCOSE, CAPILLARY - Abnormal; Notable for the following components:   Glucose-Capillary 165 (*)    All other components within normal limits  GLUCOSE, CAPILLARY -  Abnormal; Notable for the following components:   Glucose-Capillary 160 (*)    All other components within normal limits  GLUCOSE, CAPILLARY - Abnormal; Notable for the following components:   Glucose-Capillary 165 (*)    All other components within normal limits  GLUCOSE, CAPILLARY - Abnormal; Notable for the following components:   Glucose-Capillary 154 (*)    All other components within normal limits  GLUCOSE, CAPILLARY - Abnormal; Notable for the following components:   Glucose-Capillary 144 (*)    All other components within normal limits  GLUCOSE, CAPILLARY - Abnormal; Notable for the following components:   Glucose-Capillary 117 (*)    All other components within normal limits  GLUCOSE, CAPILLARY - Abnormal; Notable for the following components:   Glucose-Capillary 190 (*)    All other components within normal limits  CULTURE, BLOOD (ROUTINE X 2)  CULTURE, BLOOD (ROUTINE X 2)  MRSA PCR SCREENING  URINE CULTURE  CULTURE, RESPIRATORY (NON-EXPECTORATED)  LACTIC ACID, PLASMA  LIPASE, BLOOD  PROCALCITONIN  INFLUENZA PANEL BY PCR (TYPE A & B)  PROTIME-INR  PROCALCITONIN  PROTIME-INR  MAGNESIUM  MAGNESIUM  PHOSPHORUS  PHOSPHORUS  CBC  COMPREHENSIVE METABOLIC PANEL  BRAIN NATRIURETIC PEPTIDE  TROPONIN I    Lab work reviewed by me with a number of abnormalities most concerning of which is elevated troponin as well as elevated lactic acid concerning for severe sepsis versus septic shock __________________________________________  EKG   ____________________________________________  RADIOLOGY  Chest x-ray reviewed by me consistent with right-sided pneumonia ____________________________________________   PROCEDURES  Procedure(s) performed: yes  Angiocath insertion Performed by: Darel Hong  Consent: Verbal consent obtained. Risks and benefits: risks, benefits and alternatives were discussed Time out: Immediately prior to procedure a "time out" was called to  verify the correct patient, procedure, equipment, support staff and site/side marked as required.  Preparation: Patient was prepped and draped in the usual sterile fashion.  Vein Location: left EJ  Gauge: 18  Normal blood return and flush without difficulty Patient tolerance: Patient tolerated the procedure well with no immediate complications.     Marland Kitchen  Critical Care Performed by: Darel Hong, MD Authorized by: Darel Hong, MD   Critical care provider statement:    Critical care time (minutes):  40   Critical care time was exclusive of:  Separately billable procedures and treating other patients   Critical care was necessary to treat or prevent imminent or life-threatening deterioration of the following conditions:  Sepsis, shock and respiratory failure   Critical care was time spent personally by me on the following activities:  Development of treatment plan with patient or surrogate, discussions with consultants, evaluation of patient's response to treatment, examination of patient, obtaining history from patient or surrogate, ordering and performing treatments and interventions, ordering and review of laboratory studies, ordering and review of radiographic studies, pulse oximetry, re-evaluation of patient's condition and review of old charts Procedure Name: Intubation Date/Time: 01/30/2018 6:13 PM Performed by: Darel Hong, MD Pre-anesthesia Checklist: Patient identified, Patient being monitored, Emergency Drugs available, Timeout performed and Suction available Oxygen Delivery Method: Non-rebreather mask Preoxygenation: Pre-oxygenation with 100% oxygen Induction Type: Rapid sequence Ventilation: Mask ventilation without difficulty Laryngoscope Size: Mac and 4 Grade View: Grade I Tube size: 7.5 mm Number of attempts: 1 Placement Confirmation: ETT inserted through vocal cords under direct vision,  CO2 detector and Breath sounds checked- equal and bilateral    .Central  Line Date/Time: 01/30/2018 6:13 PM Performed by: Darel Hong, MD Authorized by: Darel Hong, MD   Consent:    Consent obtained:  Emergent situation Pre-procedure details:    Hand hygiene: Hand hygiene performed prior to insertion     Sterile barrier technique: All elements of maximal sterile technique followed     Skin preparation:  2% chlorhexidine   Skin preparation agent: Skin preparation agent completely dried prior to procedure   Procedure details:    Location:  R femoral   Patient position:  Trendelenburg   Procedural supplies:  Triple lumen   Landmarks identified: yes     Ultrasound guidance: yes     Sterile ultrasound techniques: Sterile gel and sterile probe covers were used     Number of attempts:  1   Successful placement: yes   Post-procedure details:    Post-procedure:  Dressing applied and line sutured   Assessment:  Blood return through all ports and free fluid flow   Patient tolerance of procedure:  Tolerated well, no immediate complications    Critical Care performed: Yes  Observation: no ____________________________________________   INITIAL IMPRESSION / ASSESSMENT AND PLAN / ED COURSE  Pertinent labs & imaging results that were available during my care of the patient were reviewed by me and considered in my medical decision making (see chart for details).  The patient arrived hypertensive tachycardic tachypneic febrile and critically ill.  Decision was made immediately to intubate for the patient's own safety for impending respiratory failure and cardiovascular collapse.  Nursing was unable to establish IV access so I placed a left-sided external jugular IV and intubated with ketamine and rocuronium on first pass without complication.  Subsequently after intubation the patient became hypotensive so I placed a right femoral central line in full sterile conditions began norepinephrine.  Broad-spectrum antibiotics begun along with 30 cc/kg of IV fluid and  Tamiflu presumptively.  I then discussed with the hospitalist who has graciously agreed to admit the patient to his service.  Family subsequently came to bedside and I notified and updated them on the patient's condition and her guarded outcome.      ____________________________________________   FINAL CLINICAL IMPRESSION(S) /  ED DIAGNOSES  Final diagnoses:  Severe sepsis ([place])  Acute respiratory failure, unspecified whether with hypoxia or hypercapnia ([place])  Septic shock ([place])      NEW MEDICATIONS STARTED DURING THIS VISIT:  Current Discharge Medication List       Note:  This document was prepared using Dragon voice recognition software and may include unintentional dictation errors.     Darel Hong, MD 01/31/18 2211

## 2018-01-30 NOTE — Progress Notes (Signed)
Pharmacy Antibiotic Note  Deeann CreeGlenda C Tortora is a 72 y.o. female admitted on 01/30/2018 with sepsis.  Pharmacy has been consulted for vancomycin + cefepime dosing.  Plan: Cefepime 2 g IV q8h   Vancomycin 1000 mg IV q12h (6 hr stack) Goal VT 15-20 mcg/mL  Kinetics: Adjusted body weight = 81 kg, CrCl = 65 mL/min Ke: 0.058 Half-life: 11.8 hrs Vd: 57L Cmin (estimated) 17 mcg/mL  Height: 5\' 9"  (175.3 cm) Weight: 229 lb (103.9 kg) IBW/kg (Calculated) : 66.2  No data recorded.  Recent Labs  Lab 01/30/18 1717  WBC 8.6  CREATININE 0.93  LATICACIDVEN 3.7*    Estimated Creatinine Clearance: 70.2 mL/min (by C-G formula based on SCr of 0.93 mg/dL).    Allergies  Allergen Reactions  . Peanuts [Peanut Oil] Shortness Of Breath  . Lasix [Furosemide] Swelling    Tongue swelling  . Lisinopril Swelling    Tongue swelling    Antimicrobials this admission: cefepime 2/28 >>  vancomycin 2/28 >>   Dose adjustments this admission:  Microbiology results: 2/28 BCx: Sent 2/28 UCx: Sent  2/28 MRSA PCR: Sent  Thank you for allowing pharmacy to be a part of this patient's care.  Cindi CarbonMary M Denisha Hoel, PharmD, BCPS Clinical Pharmacist 01/30/2018 7:45 PM   Addendum: Cefepime consult discontinued and piperacillin/tazobactam consult entered. Will order piperacillin/tazobactam 3.375 g IV q8h EI.   Cindi CarbonMary M Galadriel Shroff, PharmD 01/30/18 7:45 PM

## 2018-01-30 NOTE — Progress Notes (Signed)
Pharmacy Antibiotic Note  Virginia Barnes is a 72 y.o. female admitted on 01/30/2018 with sepsis.  Pharmacy has been consulted for vancomycin + cefepime dosing.  Plan: Cefepime 2 g IV q8h   Vancomycin 1000 mg IV q12h (6 hr stack) Goal VT 15-20 mcg/mL  Kinetics: Adjusted body weight = 81 kg, CrCl = 65 mL/min Ke: 0.058 Half-life: 11.8 hrs Vd: 57L Cmin (estimated) 17 mcg/mL  Height: 5\' 9"  (175.3 cm) Weight: 229 lb (103.9 kg) IBW/kg (Calculated) : 66.2  No data recorded.  Recent Labs  Lab 01/30/18 1717  WBC 8.6  CREATININE 0.93  LATICACIDVEN 3.7*    Estimated Creatinine Clearance: 70.2 mL/min (by C-G formula based on SCr of 0.93 mg/dL).    Allergies  Allergen Reactions  . Peanuts [Peanut Oil] Shortness Of Breath  . Lasix [Furosemide] Swelling    Tongue swelling  . Lisinopril Swelling    Tongue swelling    Antimicrobials this admission: cefepime 2/28 >>  vancomycin 2/28 >>   Dose adjustments this admission:  Microbiology results: 2/28 BCx: Sent 2/28 UCx: Sent  2/28 MRSA PCR: Sent  Thank you for allowing pharmacy to be a part of this patient's care.  Cindi CarbonMary M Ilija Maxim, PharmD, BCPS Clinical Pharmacist 01/30/2018 7:37 PM

## 2018-01-30 NOTE — H&P (Signed)
[facility] [place] [facility]   PATIENT NAME: Virginia CarbonGlenda Depaoli    MR#:  [id number]  DATE OF BIRTH:  Jul 12, 1946  DATE OF ADMISSION:  01/30/2018  PRIMARY CARE PHYSICIAN: Margaretann LovelessKhan, Neelam S, MD   REQUESTING/REFERRING PHYSICIAN:   CHIEF COMPLAINT:   Chief Complaint  Patient presents with  . Fever    HISTORY OF PRESENT ILLNESS: Virginia Barnes  is a 72 y.o. female with a known history of congestive heart failure, diabetes mellitus type 2, diverticulitis, hypertension is a resident of [facility].  Patient was found to be confused and lethargic and also had fever of 101.9 F.  She was referred to the emergency room at our hospital she was hypoxic on oxygen by nasal cannula at 4 L with decreased responsiveness.  Patient was febrile and blood pressure was low around 64 / 45 mmHg.  Patient received IV fluid boluses in the emergency room and her lactic acid level was elevated.  She was found to be in septic shock and was started on IV Levophed drip and IV fluids were started based on sepsis protocol.  Code sepsis was called.  Patient was intubated electively and put on ventilator as she was not able to maintain airway and oxygen saturation.  Her blood pressure improved to 100 x 49 mmHg after fluid boluses and IV Levophed drip was started.  Patient was put on IV propofol drip for sedation.  Hospitalist service was consulted.  CT head without contrast was also ordered in the emergency room.  Patient on ventilator unable to give any history.  PAST MEDICAL HISTORY:   Past Medical History:  Diagnosis Date  . CHF (congestive heart failure) (HCC)   . Diabetes mellitus without complication (HCC)   . Diverticulitis   . Hypertension     PAST SURGICAL HISTORY:  Past Surgical History:  Procedure Laterality Date  . ABDOMINAL HYSTERECTOMY    . CARDIAC CATHETERIZATION Left 04/13/2016   Procedure: Left Heart Cath and Coronary Angiography;  Surgeon: Laurier NancyShaukat A Khan, MD;  Location:  [facility] INVASIVE CV LAB;  Service: Cardiovascular;  Laterality: Left;  . JOINT REPLACEMENT      SOCIAL HISTORY:  Social History   Tobacco Use  . Smoking status: Former Smoker    Packs/day: 0.50    Years: 15.00    Pack years: 7.50    Types: Cigarettes  . Smokeless tobacco: Never Used  Substance Use Topics  . Alcohol use: No    FAMILY HISTORY:  Family History  Problem Relation Age of Onset  . Stomach cancer Sister 3025  . Vaginal cancer Sister 830  . Heart disease Father   . Breast cancer Neg Hx     DRUG ALLERGIES:  Allergies  Allergen Reactions  . Peanuts [Peanut Oil] Shortness Of Breath  . Lasix [Furosemide] Swelling    Tongue swelling  . Lisinopril Swelling    Tongue swelling    REVIEW OF SYSTEMS:  Could not be obtained as patient is on ventilator  MEDICATIONS AT HOME:  Prior to Admission medications   Medication Sig Start Date End Date Taking? Authorizing Provider  ASPIRIN LOW DOSE 81 MG EC tablet Take 1 tablet by mouth daily. 09/26/15  Yes [provider]  Calcium Carbonate-Vitamin D3 (CALCIUM 600/VITAMIN D) 600-400 MG-UNIT TABS Take 1 tablet by mouth 2 (two) times daily.   Yes [provider]  cetirizine (ZYRTEC) 10 MG tablet Take 10 mg by mouth every evening.   Yes [provider]  clotrimazole (LOTRIMIN) 1 % cream Apply 1 application topically 2 (two) times daily.   Yes [provider]  ferrous sulfate 325 (65 FE) MG EC tablet Take 325 mg by mouth daily.   Yes [provider]  fluticasone (FLONASE) 50 MCG/ACT nasal spray Place 2 sprays into both nostrils daily.   Yes [provider]  hydrALAZINE (APRESOLINE) 25 MG tablet Take 1 tablet by mouth 2 (two) times daily. 11/04/15  Yes [provider]  hydrochlorothiazide (HYDRODIURIL) 25 MG tablet Take 1 tablet by mouth daily. 11/04/15  Yes [provider]  insulin aspart (NOVOLOG) 100 UNIT/ML injection 1-150 - no coverage.  151-200 - 2 units 201-250 4  units 251-300 - 6 units 301-350 - 8 units 351-400 - 10 units and call MD. Patient taking differently: Inject 0-10 Units into the skin 3 (three) times daily with meals.  01/13/18  Yes Sainani, Rolly Pancake, MD  ipratropium (ATROVENT) 0.03 % nasal spray Place 2 sprays into both nostrils 3 (three) times daily as needed for rhinitis.   Yes [provider]  omeprazole (PRILOSEC) 40 MG capsule Take 1 capsule by mouth daily. 09/26/15  Yes [provider]  potassium chloride (K-DUR) 10 MEQ tablet Take 10 mEq by mouth daily.    Yes [provider]  rosuvastatin (CRESTOR) 20 MG tablet Take 20 mg by mouth daily.   Yes [provider]  sertraline (ZOLOFT) 100 MG tablet Take 100 mg by mouth daily.   Yes [provider]  sotalol (BETAPACE) 80 MG tablet 80 mg 2 (two) times daily.    Yes [provider]  ACCU-CHEK AVIVA PLUS test strip  12/26/16   [provider]  ASSURE COMFORT LANCETS 30G MISC  12/26/16   [provider]  Blood Glucose Calibration (ACCU-CHEK AVIVA) SOLN  12/26/16   [provider]  traMADol (ULTRAM) 50 MG tablet Take 1 tablet (50 mg total) by mouth every 6 (six) hours as needed. Patient taking differently: Take 50 mg by mouth 2 (two) times daily as needed.  10/24/17 10/24/18  Darci Current, MD      PHYSICAL EXAMINATION:   VITAL SIGNS: Blood pressure (!) 78/43, pulse 74, resp. rate 19, SpO2 100 %.  GENERAL:  72 y.o.-year-old patient lying in the bed on ventilator EYES: Pupils equal, round, reactive to light and accommodation. No scleral icterus. Extraocular muscles intact.  HEENT: Head atraumatic, normocephalic. Oropharynx ET tube noted NECK:  Supple, no jugular venous distention. No thyroid enlargement, no tenderness.  LUNGS: Decreased breath sounds bilaterally, no wheezing, rales,rhonchi or crepitation. No use of accessory muscles of respiration.  CARDIOVASCULAR: S1, S2 normal. No murmurs, rubs, or gallops.   ABDOMEN: Soft, nontender, nondistended. Bowel sounds present. No organomegaly or mass.  EXTREMITIES: No pedal edema, cyanosis, or clubbing.  NEUROLOGIC: Not oriented to time, place and person On ventilator . Gait not checked.  PSYCHIATRIC: could not be assessed  SKIN: No obvious rash, lesion, or ulcer.   LABORATORY PANEL:   CBC Recent Labs  Lab 01/30/18 1717  WBC 8.6  HGB 11.0*  HCT 34.4*  PLT 382  MCV 86.8  MCH 27.8  MCHC 32.0  RDW 14.5  LYMPHSABS 1.5  MONOABS 0.9  EOSABS 0.0  BASOSABS 0.1   ------------------------------------------------------------------------------------------------------------------  Chemistries  Recent Labs  Lab 01/30/18 1717  NA 136  K 4.6  CL 102  CO2 23  GLUCOSE 287*  BUN 14  CREATININE 0.93  CALCIUM 8.5*  AST 46*  ALT 30  ALKPHOS 56  BILITOT 1.2   ------------------------------------------------------------------------------------------------------------------ CrCl cannot be calculated (Unknown ideal weight.). ------------------------------------------------------------------------------------------------------------------ No results for input(s): TSH, T4TOTAL, T3FREE, THYROIDAB in the last 72 hours.  Invalid input(s): FREET3   Coagulation profile No results for input(s): INR, PROTIME in the last 168 hours. ------------------------------------------------------------------------------------------------------------------- No results for input(s): DDIMER in the last 72 hours. -------------------------------------------------------------------------------------------------------------------  Cardiac Enzymes Recent Labs  Lab 01/30/18 1717  TROPONINI 0.06*   ------------------------------------------------------------------------------------------------------------------ Invalid input(s): POCBNP  ---------------------------------------------------------------------------------------------------------------  Urinalysis     Component Value Date/Time   COLORURINE STRAW (A) 01/12/2018 0821   APPEARANCEUR CLEAR (A) 01/12/2018 0821   APPEARANCEUR Hazy 05/11/2013 0039   LABSPEC 1.001 (L) 01/12/2018 0821   LABSPEC 1.017 05/11/2013 0039   PHURINE 7.0 01/12/2018 0821   GLUCOSEU NEGATIVE 01/12/2018 0821   GLUCOSEU Negative 05/11/2013 0039   HGBUR MODERATE (A) 01/12/2018 0821   BILIRUBINUR NEGATIVE 01/12/2018 0821   BILIRUBINUR Negative 05/11/2013 0039   KETONESUR NEGATIVE 01/12/2018 0821   PROTEINUR NEGATIVE 01/12/2018 0821   NITRITE NEGATIVE 01/12/2018 0821   LEUKOCYTESUR NEGATIVE 01/12/2018 0821   LEUKOCYTESUR Trace 05/11/2013 0039     RADIOLOGY: No results found.  EKG: Orders placed or performed during the hospital encounter of 01/30/18  . ED EKG 12-Lead  . ED EKG 12-Lead    IMPRESSION AND PLAN: 72 year old elderly female patient with history of congestive heart failure, diverticular disease, hypertension, diabetes mellitus type 2 who is a resident of [facility] facility was referred for lethargy, confusion, fever and admitting diagnosis.  Admitting diagnosis 1.  Septic shock 2.  Acute respiratory failure 3.  Hypotension 4.  Altered mental status Treatment plan Admit patient to ICU Continue mechanical ventilator IV fluid based on sepsis protocol IV vancomycin and IV Zosyn antibiotic IV Levophed drip for support of blood pressure  intensivist consultation Follow-up CT head without contrast  DVT prophylaxis sequential compression device to lower extremities Sliding scale coverage for diabetes mellitus   All the records are reviewed and case discussed with ED provider. Management plans discussed with the patient, family and they are in agreement.  CODE STATUS:FULL CODE    Code Status Orders  (From admission, onward)        Start     Ordered   01/30/18 1826  Full code  Continuous     01/30/18 1826    Code Status History    Date Active Date Inactive Code Status Order ID  Comments User Context   01/12/2018 11:18 01/13/2018 19:39 Full Code [id number]  Houston Siren, MD Inpatient       TOTAL CRITICAL CARE TIME TAKING CARE OF THIS PATIENT: 60 minutes.    Ihor Austin M.D on 01/30/2018 at 6:40 PM  Between 7am to 6pm - Pager - [phone]  After 6pm go to [email or web address] - password [email or web address] [facility]  [place] [facility]  Office  [phone]  CC: Primary care physician; Margaretann Loveless, MD

## 2018-01-30 NOTE — ED Notes (Signed)
Pt febrile, w/ AMS, tachypnic, tachycardic, hypertensive, and febrile. Pt amble to verify name and DOB. EDP chose to intubate pt. Pt in respiratory distress.

## 2018-01-30 NOTE — ED Notes (Signed)
Placement verified by CXR. Verbal orders from [place], EDP to use R fem triple lumen

## 2018-01-30 NOTE — ED Triage Notes (Signed)
Pt presents via EMS w/ new onset fever and AMS. Pt is resident of [facility]. Pt is hypoxic on 4L

## 2018-01-30 NOTE — Sedation Documentation (Signed)
Family updated as to patient's status.

## 2018-01-31 ENCOUNTER — Inpatient Hospital Stay: Payer: Medicare HMO

## 2018-01-31 DIAGNOSIS — J189 Pneumonia, unspecified organism: Secondary | ICD-10-CM

## 2018-01-31 DIAGNOSIS — J9601 Acute respiratory failure with hypoxia: Secondary | ICD-10-CM

## 2018-01-31 DIAGNOSIS — R6521 Severe sepsis with septic shock: Secondary | ICD-10-CM

## 2018-01-31 DIAGNOSIS — A419 Sepsis, unspecified organism: Principal | ICD-10-CM

## 2018-01-31 LAB — BASIC METABOLIC PANEL
ANION GAP: 8 (ref 5–15)
BUN: 16 mg/dL (ref 6–20)
CALCIUM: 7.9 mg/dL — AB (ref 8.9–10.3)
CO2: 24 mmol/L (ref 22–32)
Chloride: 107 mmol/L (ref 101–111)
Creatinine, Ser: 1.02 mg/dL — ABNORMAL HIGH (ref 0.44–1.00)
GFR, EST NON AFRICAN AMERICAN: 54 mL/min — AB (ref >60)
GLUCOSE: 176 mg/dL — AB (ref 65–99)
POTASSIUM: 3.4 mmol/L — AB (ref 3.5–5.1)
Sodium: 139 mmol/L (ref 135–145)

## 2018-01-31 LAB — PROTIME-INR
INR: 1.15
Prothrombin Time: 14.6 seconds (ref 11.4–15.2)

## 2018-01-31 LAB — CBC
HEMATOCRIT: 27.2 % — AB (ref 35.0–47.0)
HEMOGLOBIN: 9 g/dL — AB (ref 12.0–16.0)
MCH: 28.2 pg (ref 26.0–34.0)
MCHC: 32.9 g/dL (ref 32.0–36.0)
MCV: 85.6 fL (ref 80.0–100.0)
Platelets: 241 10*3/uL (ref 150–440)
RBC: 3.18 MIL/uL — AB (ref 3.80–5.20)
RDW: 14.6 % — ABNORMAL HIGH (ref 11.5–14.5)
WBC: 8.1 10*3/uL (ref 3.6–11.0)

## 2018-01-31 LAB — GLUCOSE, CAPILLARY
GLUCOSE-CAPILLARY: 165 mg/dL — AB (ref 65–99)
GLUCOSE-CAPILLARY: 144 mg/dL — AB (ref 65–99)
GLUCOSE-CAPILLARY: 117 mg/dL — AB (ref 65–99)
Glucose-Capillary: 154 mg/dL — ABNORMAL HIGH (ref 65–99)
Glucose-Capillary: 190 mg/dL — ABNORMAL HIGH (ref 65–99)

## 2018-01-31 LAB — MAGNESIUM
MAGNESIUM: 1.9 mg/dL (ref 1.7–2.4)
MAGNESIUM: 2 mg/dL (ref 1.7–2.4)

## 2018-01-31 LAB — TROPONIN I
TROPONIN I: 0.45 ng/mL — AB (ref <0.03)
Troponin I: 0.4 ng/mL (ref <0.03)

## 2018-01-31 LAB — PHOSPHORUS
PHOSPHORUS: 2.6 mg/dL (ref 2.5–4.6)
Phosphorus: 3.2 mg/dL (ref 2.5–4.6)

## 2018-01-31 MED ORDER — FAMOTIDINE 20 MG IN NS 100 ML IVPB
20.0000 mg | INTRAVENOUS | Status: DC
  Administered 2018-01-31: 20 mg via INTRAVENOUS
  Filled 2018-01-31 (×2): qty 100

## 2018-01-31 MED ORDER — FAMOTIDINE IN NACL 20-0.9 MG/50ML-% IV SOLN: 20.0000 mg | INTRAVENOUS | Status: DC

## 2018-01-31 MED ORDER — ACETAMINOPHEN 325 MG PO TABS: 650.0000 mg | ORAL_TABLET | Freq: Four times a day (QID) | ORAL | Status: DC | PRN

## 2018-01-31 MED ORDER — INSULIN ASPART 100 UNIT/ML ~~LOC~~ SOLN
0.0000 [IU] | SUBCUTANEOUS | Status: DC
  Administered 2018-01-31: 2 [IU] via SUBCUTANEOUS
  Administered 2018-01-31 – 2018-02-01 (×2): 3 [IU] via SUBCUTANEOUS
  Filled 2018-01-31 (×3): qty 1

## 2018-01-31 MED ORDER — ACETAMINOPHEN 650 MG RE SUPP: 650.0000 mg | Freq: Four times a day (QID) | RECTAL | Status: DC | PRN

## 2018-01-31 MED ORDER — VITAL HIGH PROTEIN PO LIQD
1000.0000 mL | ORAL | Status: DC
  Administered 2018-01-31: 1000 mL

## 2018-01-31 MED ORDER — VITAL HIGH PROTEIN PO LIQD
1000.0000 mL | ORAL | Status: DC
  Administered 2018-02-01: 1000 mL

## 2018-01-31 MED ORDER — FAMOTIDINE 20 MG IN NS 100 ML IVPB: 20.0000 mg | INTRAVENOUS | Status: DC

## 2018-01-31 MED ORDER — FREE WATER
100.0000 mL | Freq: Three times a day (TID) | Status: DC
  Administered 2018-01-31 – 2018-02-01 (×3): 100 mL

## 2018-01-31 MED ORDER — PRO-STAT SUGAR FREE PO LIQD
30.0000 mL | Freq: Two times a day (BID) | ORAL | Status: DC
  Administered 2018-01-31 (×3): 30 mL

## 2018-01-31 NOTE — Progress Notes (Signed)
Pharmacy Antibiotic Note  Virginia CreeGlenda C Barnes is a 72 y.o. female admitted on 01/30/2018 with aspiration pneumonia.  Pharmacy has been consulted for Zosyn dosing. Patient with presumed aspiration event, but admitted from a nursing home.   Plan: Zosyn EI 3.375g IV Q8hr.   Vancomycin discontinued secondary to negative MRSA PCR.   Height: 5\' 9"  (175.3 cm) Weight: 220 lb 0.3 oz (99.8 kg) IBW/kg (Calculated) : 66.2  Temp (24hrs), Avg:98 F (36.7 C), Min:97.6 F (36.4 C), Max:98.3 F (36.8 C)  Recent Labs  Lab 01/30/18 1717 01/30/18 2142 01/31/18 0441  WBC 8.6  --  8.1  CREATININE 0.93  --  1.02*  LATICACIDVEN 3.7* 0.9  --     Estimated Creatinine Clearance: 62.6 mL/min (A) (by C-G formula based on SCr of 1.02 mg/dL (H)).    Allergies  Allergen Reactions  . Peanuts [Peanut Oil] Shortness Of Breath  . Lasix [Furosemide] Swelling    Tongue swelling  . Lisinopril Swelling    Tongue swelling    Antimicrobials this admission: Vancomycin 2/28 x 1 Zosyn 2/28 >>   Dose adjustments this admission: N/A  Microbiology results: 2/28 BCx: no growth < 24 hours  2/28 MRSA PCR: negative  Thank you for allowing pharmacy to be a part of this patient's care.  Tangelia Sanson L 01/31/2018 4:02 PM

## 2018-01-31 NOTE — Progress Notes (Signed)
Virginia KeensUpdated Dana NP on urine output, output has been 30 ml or less per hour (see intake and output flowsheet). Bladder scan preformed, no residual urine in bladder. Will continue to monitor hourly urine output.

## 2018-01-31 NOTE — Progress Notes (Signed)
PT PROFILE: [age] [facility] pt admitted via ED with fever, altered cognition, acute hypoxemic respiratory failure, bilateral pulmonary infiltrates consistent with pneumonia.  Intubated in ED.  Admitted to ICU.  Initially required vasopressors.  MAJOR EVENTS/TEST RESULTS: 02/28 admission as documented above 02/28 CT head: No acute findings 03/01 weaning FiO2.  Off vasopressors.  Patient awakens easily and follows commands appropriately  INDWELLING DEVICES:: ETT 02/28 >>  R femoral CVL 02/28 >>   MICRO DATA: MRSA PCR 0/28 >> NEG Flu PCR 02/28 >> NEG Urine 02/28 >>  Resp 02/28 >>  Blood 02/28 >>   ANTIMICROBIALS:  Vanc 02/28 X 1 Oseltamivir 02/28 >> 03/01 Pip-tazo 02/28 >>    SUBJ: RASS 0, -1.  Synchronous with ventilator.  Follows commands appropriately.  VITAL SIGNS: BP (!) 111/46   Pulse 63   Temp 97.8 F (36.6 C) (Axillary)   Resp 16   Ht 5\' 9"  (1.753 m)   Wt 99.8 kg (220 lb 0.3 oz)   SpO2 100%   BMI 32.49 kg/m   HEMODYNAMICS:    VENTILATOR SETTINGS: Vent Mode: PRVC FiO2 (%):  [50 %-80 %] 50 % Set Rate:  [16 bmp] 16 bmp Vt Set:  [400 mL-500 mL] 450 mL PEEP:  [5 cmH20] 5 cmH20 Plateau Pressure:  [21 cmH20-24 cmH20] 24 cmH20  INTAKE / OUTPUT: I/O last 3 completed shifts: In: 4208 [I.V.:3656.6; NG/GT:251.3; IV Piggyback:300] Out: 388 [Urine:388]  PHYSICAL EXAMINATION: General: intubated, + F/C, calm, RASS 0 Neuro: CNs intact, moves all extremities HEENT: NCAT, sclerae white Cardiovascular: Regular, no murmur Lungs: Bilateral rhonchi, no wheezes or other adventitious sounds Abdomen: Soft, NT, bowel sounds present Extremities: Warm, no edema Skin: No lesions noted  LABS:  BMET Recent Labs  Lab 01/30/18 1717 01/31/18 0441  NA 136 139  K 4.6 3.4*  CL 102 107  CO2 23 24  BUN 14 16  CREATININE 0.93 1.02*  GLUCOSE 287* 176*    Electrolytes Recent Labs  Lab 01/30/18 1717 01/31/18 0015 01/31/18 0441  CALCIUM 8.5*  --  7.9*  MG  --  2.0 1.9  PHOS   --  3.2 2.6    CBC Recent Labs  Lab 01/30/18 1717 01/31/18 0441  WBC 8.6 8.1  HGB 11.0* 9.0*  HCT 34.4* 27.2*  PLT 382 241    Coag's Recent Labs  Lab 01/30/18 1812 01/31/18 0441  INR 1.19 1.15    Sepsis Markers Recent Labs  Lab 01/30/18 1717 01/30/18 2142  LATICACIDVEN 3.7* 0.9  PROCALCITON <0.10 3.74    ABG Recent Labs  Lab 01/30/18 1813  PHART 7.25*  PCO2ART 59*  PO2ART 86    Liver Enzymes Recent Labs  Lab 01/30/18 1717  AST 46*  ALT 30  ALKPHOS 56  BILITOT 1.2  ALBUMIN 3.4*    Cardiac Enzymes Recent Labs  Lab 01/30/18 2142 01/31/18 0015 01/31/18 0441  TROPONINI 0.37* 0.45* 0.40*    Glucose Recent Labs  Lab 01/30/18 2046 01/30/18 2205 01/31/18 0401 01/31/18 0731 01/31/18 1154  GLUCAP 165* 160* 165* 154* 144*    CXR: Diffuse opacity throughout the right lung, left lower lobe consolidation and assess    ASSESSMENT / PLAN:  PULMONARY A: Acute hypoxemic respiratory failure Care associated pneumonia Possible component of ARDS Left lower lobe atelectasis P:   Cont full vent support - settings reviewed and/or adjusted Cont vent bundle Daily SBT if/when meets criteria   CARDIOVASCULAR A:  Septic shock, resolved History of CHF History of hypertension Minimally elevated troponin I-likely  demand ischemia P:  MAP goal >65 mmHg Norepinephrine as needed to maintain MAP goal Recheck BNP, troponin I in AM. 03/02  RENAL A:   Mild AKI, nonoliguric Mild hypokalemia P:   Monitor BMET intermittently Monitor I/Os Correct electrolytes as indicated   GASTROINTESTINAL A:   Neurovirus enteritis P:   SUP: IV famotidine TF protocol initiated  HEMATOLOGIC A:   Acute on chronic anemia without overt bleeding P:  DVT px: Enoxaparin Monitor CBC intermittently Transfuse per usual guidelines   INFECTIOUS A:   Healthcare associated pneumonia, likely aspiration Severe sepsis P:   Monitor temp, WBC count Micro and abx as  above   ENDOCRINE A:   Type 2 diabetes, controlled P:   Continue moderate scale SSI  NEUROLOGIC A:   Encephalopathy, resolved P:   RASS goal: 0, -1 Continue PAD protocol   FAMILY: Family updated in detail at bedside   CCM time: 40 mins  The above time includes time spent in consultation with patient and/or family members and reviewing care plan on multidisciplinary rounds  Billy Fischer, MD PCCM service Mobile [phone] Pager [phone] 01/31/2018 3:40 PM

## 2018-01-31 NOTE — Progress Notes (Addendum)
[facility] [place] [facility]   PATIENT NAME: Virginia Barnes    MR#:  [id number]  DATE OF BIRTH:  [birth year]  SUBJECTIVE:   Patient here due to sepsis secondary to pneumonia and also respiratory failure secondary to pneumonia.  Currently intubated and sedated.  Opens eyes and follows simple commands.  Off vasopressors.  FiO2 decreased from 100% to 70% this morning.  REVIEW OF SYSTEMS:    Review of Systems  Unable to perform ROS: Intubated    Nutrition: Tube feeds Tolerating Diet: Yes Tolerating PT: Await Eval once extubated.    DRUG ALLERGIES:   Allergies  Allergen Reactions  . Peanuts [Peanut Oil] Shortness Of Breath  . Lasix [Furosemide] Swelling    Tongue swelling  . Lisinopril Swelling    Tongue swelling    VITALS:  Blood pressure (!) 111/46, pulse 63, temperature 97.8 F (36.6 C), temperature source Axillary, resp. rate 16, height 5\' 9"  (1.753 m), weight 99.8 kg (220 lb 0.3 oz), SpO2 100 %.  PHYSICAL EXAMINATION:   Physical Exam  GENERAL:  72 y.o.-year-old patient lying in bed intubated & sedated.  EYES: Pupils equal, round, reactive to light. No scleral icterus. HEENT: Head atraumatic, normocephalic. ET and OG tubes in place.  NECK:  Supple, no jugular venous distention. No thyroid enlargement, no tenderness.  LUNGS: Normal breath sounds bilaterally, no wheezing, rales, rhonchi. No use of accessory muscles of respiration.  CARDIOVASCULAR: S1, S2 normal. No murmurs, rubs, or gallops.  ABDOMEN: Soft, nontender, nondistended. Bowel sounds present. No organomegaly or mass.  EXTREMITIES: No cyanosis, clubbing or edema b/l.    NEUROLOGIC: Sedated & Intubated.  PSYCHIATRIC: Sedated & Intubated SKIN: No obvious rash, lesion, or ulcer.    LABORATORY PANEL:   CBC Recent Labs  Lab 01/31/18 0441  WBC 8.1  HGB 9.0*  HCT 27.2*  PLT 241    ------------------------------------------------------------------------------------------------------------------  Chemistries  Recent Labs  Lab 01/30/18 1717  01/31/18 0441  NA 136  --  139  K 4.6  --  3.4*  CL 102  --  107  CO2 23  --  24  GLUCOSE 287*  --  176*  BUN 14  --  16  CREATININE 0.93  --  1.02*  CALCIUM 8.5*  --  7.9*  MG  --    < > 1.9  AST 46*  --   --   ALT 30  --   --   ALKPHOS 56  --   --   BILITOT 1.2  --   --    < > = values in this interval not displayed.   ------------------------------------------------------------------------------------------------------------------  Cardiac Enzymes Recent Labs  Lab 01/31/18 0441  TROPONINI 0.40*   ------------------------------------------------------------------------------------------------------------------  RADIOLOGY:  Dg Abd 1 View  Result Date: 01/30/2018 CLINICAL DATA:  Orogastric tube placement. EXAM: ABDOMEN - 1 VIEW COMPARISON:  Lumbar spine radiographs October 24, 2017 FINDINGS: Nasogastric tube tip projects in mid stomach. Included bowel gas pattern is nondilated and nonobstructive. RIGHT femoral central venous catheter in place. LEFT sciatic stimulator. No intra-abdominal mass effect or pathologic calcifications. Retrocardiac consolidation. Soft tissue planes and included osseous structures are unchanged. IMPRESSION: Nasogastric tube tip projects in mid stomach. Electronically Signed   By: Awilda Metro M.D.   On: 01/30/2018 21:51   Ct Head Wo Contrast  Result Date: 01/30/2018 CLINICAL DATA:  Congestive heart failure. Diabetes. Hypertension. Acute presentation with confusion, lethargy and fever. EXAM: CT HEAD WITHOUT CONTRAST TECHNIQUE: Contiguous axial images were obtained from  the base of the skull through the vertex without intravenous contrast. COMPARISON:  01/11/2018 FINDINGS: Brain: No change. Generalized atrophy. Chronic small-vessel changes of the white matter. No sign of acute  infarction, mass lesion, hemorrhage, hydrocephalus or extra-axial collection. Vascular: There is atherosclerotic calcification of the major vessels at the base of the brain. Skull: Negative Sinuses/Orbits: Ordinary seasonal mucosal thickening. No advanced sinusitis. Orbits negative. Other: None IMPRESSION: No acute finding by CT. Atrophy and chronic small-vessel change of the white matter. Electronically Signed   By: Paulina FusiMark  Shogry M.D.   On: 01/30/2018 19:20   Dg Chest Port 1 View  Result Date: 01/31/2018 CLINICAL DATA:  Acute respiratory failure. EXAM: PORTABLE CHEST 1 VIEW COMPARISON:  01/30/2018. FINDINGS: Endotracheal tube and NG scratched it endotracheal tube noted with its tip 1.8 cm above the carina. NG tube noted with tip below left hemidiaphragm. Heart size stable. Diffuse right lung infiltrate and left lower lobe atelectasis again noted. Small bilateral pleural effusions. No pneumothorax. IMPRESSION: 1. Endotracheal tube tip noted 1.8 cm above the carina. NG tube tip noted below left hemidiaphragm. 2. Diffuse right lung infiltrate. Left lower lobe atelectasis. Small bilateral pleural effusions. No change from prior exam. Electronically Signed   By: Maisie Fushomas  Register   On: 01/31/2018 06:32   Dg Chest Port 1 View  Result Date: 01/30/2018 CLINICAL DATA:  Short of breath EXAM: PORTABLE CHEST 1 VIEW COMPARISON:  01/11/2018, 11/11/2015 FINDINGS: Endotracheal tube tip projects 3.9 cm superior to the carina. Esophageal tube tip is below the diaphragm but is not included. Cardiomegaly. Development of diffuse airspace disease in the right thorax with consolidation at the left base and suspected small left effusion. Aortic atherosclerosis. No pneumothorax. IMPRESSION: 1. Endotracheal tube tip about 3.9 cm superior to carina. Esophageal tube tip is below the diaphragm but not included on the image 2. Extensive airspace disease in the right hemithorax and left lung base suspicious for multifocal pneumonia. Suspect  that there is a small left effusion 3. Cardiomegaly Electronically Signed   By: Jasmine PangKim  Fujinaga M.D.   On: 01/30/2018 18:42     ASSESSMENT AND PLAN:   72 year old female with past medical history of diabetes, CHF, hypertension, previous history of diverticulitis who presents to the hospital due to fever, hypotension, altered mental status.  1.  Sepsis- patient presented with fever, hypotension, leukocytosis and ruled in for sepsis.  Chest x-ray findings were suggestive of multifocal pneumonia. -Patient off IV vasopressors now, continue broad-spectrum IV antibiotics with vancomycin, Zosyn, continue IV fluids.  Follow cultures, hemodynamics.  2.  Acute respiratory failure with hypoxia-secondary to pneumonia.  Patient currently intubated and sedated. -Continue vent support and care as per pulmonary/intensivist.  FiO2 decreased from 100% to 70% today. - Patient has minimal secretions.  Continue broad-spectrum IV antibiotics as mentioned above.  3.  Pneumonia-this is noted to be the source of patient's sepsis. -Continue broad-spectrum IV antibiotics with vancomycin, Zosyn.  Follow cultures.  4.  Diabetes type 2 without complication-continue sliding scale insulin.  5.  Essential hypertension-antihypertensives on hold given sepsis, hypotension on admission.  6.  GERD-continue Pepcid.   All the records are reviewed and case discussed with Care Management/Social Worker. Management plans discussed with the patient, family and they are in agreement.  CODE STATUS: full code  DVT Prophylaxis: Lovenox  TOTAL TIME TAKING CARE OF THIS PATIENT: 30 minutes.   POSSIBLE D/C unclear and depends on pt's progress.    Houston SirenSAINANI,Wm Fruchter J M.D on 01/31/2018 at 2:33 PM  Between 7am to  6pm - Pager - [phone]  After 6pm go to [email or web address] - Social research officer, government  [facility] [place] [facility]  Office  [phone]  CC: Primary care physician; Margaretann Loveless, MD

## 2018-01-31 NOTE — Progress Notes (Signed)
Initial Nutrition Assessment  DOCUMENTATION CODES:   Obesity unspecified  INTERVENTION:  Increase Vital High Protein to 6350mL/hr via OGT,  Pro-stat 30mL BID each supplement provides 100 calories and 15 grams of protein 100mL free water every 8 hours per MD  Replete K+ (3.4)  Provides 1400 calories, 135 gm protein, 1303mL free water  NUTRITION DIAGNOSIS:   Inadequate oral intake related to inability to eat as evidenced by NPO status.  GOAL:   Provide needs based on ASPEN/SCCM guidelines  MONITOR:   Skin, Weight trends, I & O's, Labs, Vent status  REASON FOR ASSESSMENT:   Ventilator    ASSESSMENT:   72 year old elderly female patient with history of congestive heart failure, diverticular disease, hypertension, diabetes mellitus type 2 who is a resident of [facility] was referred for lethargy, confusion, fever and admitting diagnosis. Septic shock, acute respiratory failure, hypotension now ventilated  No family available. Patient sedated. Tolerating TF protocol since 0300 today  Patient is currently intubated on ventilator support MV: 8.0 L/min Temp (24hrs), Avg:98.1 F (36.7 C), Min:97.6 F (36.4 C), Max:98.3 F (36.8 C) Propofol: None   Intake/Output Summary (Last 24 hours) at 01/31/2018 1112 Last data filed at 01/31/2018 1000 Gross per 24 hour  Intake 4297.97 ml  Output 453 ml  Net 3844.97 ml   OGT tip to stomach  Labs reviewed:  CBGs 154, 160, 164 K+ 3.4 AST 46  MAP: 63-95  Medications reviewed and include:  Insulin Fentanyl gtt Off levo gtt 0541 today  NUTRITION - FOCUSED PHYSICAL EXAM:    Most Recent Value  Orbital Region  No depletion  Upper Arm Region  No depletion  Thoracic and Lumbar Region  No depletion  Buccal Region  No depletion  Temple Region  No depletion  Clavicle Bone Region  No depletion  Clavicle and Acromion Bone Region  No depletion  Scapular Bone Region  No depletion  Dorsal Hand  No depletion  Patellar  Region  No depletion  Anterior Thigh Region  No depletion  Posterior Calf Region  No depletion  Edema (RD Assessment)  Mild  Hair  Reviewed  Eyes  Reviewed  Mouth  Reviewed  Skin  Reviewed  Nails  Reviewed       Diet Order:  No diet orders on file  EDUCATION NEEDS:   Not appropriate for education at this time  Skin:  Skin Assessment: Reviewed RN Assessment  Last BM:  01/30/2018 (Type 4)  Height:   Ht Readings from Last 1 Encounters:  01/30/18 5\' 9"  (1.753 m)    Weight:   Wt Readings from Last 1 Encounters:  01/31/18 220 lb 0.3 oz (99.8 kg)    Ideal Body Weight:  65.9 kg  BMI:  Body mass index is 32.49 kg/m.  Estimated Nutritional Needs:   Kcal:  1098 - 1400 calories (ABW x11-14)  Protein:  >/= 132 grams (2g/kg IBW)  Fluid:  Per MD  Dionne AnoWilliam M. Hakan Nudelman, MS, RD LDN Inpatient Clinical Dietitian Pager [phone]

## 2018-02-01 ENCOUNTER — Inpatient Hospital Stay: Payer: Medicare HMO

## 2018-02-01 DIAGNOSIS — N179 Acute kidney failure, unspecified: Secondary | ICD-10-CM

## 2018-02-01 DIAGNOSIS — R6520 Severe sepsis without septic shock: Secondary | ICD-10-CM

## 2018-02-01 LAB — COMPREHENSIVE METABOLIC PANEL
ALBUMIN: 2.7 g/dL — AB (ref 3.5–5.0)
ALK PHOS: 48 U/L (ref 38–126)
ALT: 77 U/L — ABNORMAL HIGH (ref 14–54)
ANION GAP: 8 (ref 5–15)
AST: 41 U/L (ref 15–41)
BUN: 19 mg/dL (ref 6–20)
CALCIUM: 8.5 mg/dL — AB (ref 8.9–10.3)
CO2: 26 mmol/L (ref 22–32)
Chloride: 108 mmol/L (ref 101–111)
Creatinine, Ser: 0.89 mg/dL (ref 0.44–1.00)
GFR calc non Af Amer: >60 mL/min (ref >60)
GLUCOSE: 123 mg/dL — AB (ref 65–99)
POTASSIUM: 3.5 mmol/L (ref 3.5–5.1)
Sodium: 142 mmol/L (ref 135–145)
Total Bilirubin: 0.6 mg/dL (ref 0.3–1.2)
Total Protein: 6.6 g/dL (ref 6.5–8.1)

## 2018-02-01 LAB — GLUCOSE, CAPILLARY
GLUCOSE-CAPILLARY: 160 mg/dL — AB (ref 65–99)
GLUCOSE-CAPILLARY: 119 mg/dL — AB (ref 65–99)
GLUCOSE-CAPILLARY: 178 mg/dL — AB (ref 65–99)
GLUCOSE-CAPILLARY: 187 mg/dL — AB (ref 65–99)
Glucose-Capillary: 103 mg/dL — ABNORMAL HIGH (ref 65–99)
Glucose-Capillary: 146 mg/dL — ABNORMAL HIGH (ref 65–99)
Glucose-Capillary: 148 mg/dL — ABNORMAL HIGH (ref 65–99)

## 2018-02-01 LAB — CBC
HCT: 28.7 % — ABNORMAL LOW (ref 35.0–47.0)
HEMOGLOBIN: 9.3 g/dL — AB (ref 12.0–16.0)
MCH: 27.9 pg (ref 26.0–34.0)
MCHC: 32.2 g/dL (ref 32.0–36.0)
MCV: 86.5 fL (ref 80.0–100.0)
PLATELETS: 273 10*3/uL (ref 150–440)
RBC: 3.32 MIL/uL — ABNORMAL LOW (ref 3.80–5.20)
RDW: 14.7 % — ABNORMAL HIGH (ref 11.5–14.5)
WBC: 6.1 10*3/uL (ref 3.6–11.0)

## 2018-02-01 LAB — BRAIN NATRIURETIC PEPTIDE: B Natriuretic Peptide: 599 pg/mL — ABNORMAL HIGH (ref 0.0–100.0)

## 2018-02-01 LAB — TROPONIN I
TROPONIN I: 0.37 ng/mL — AB (ref <0.03)
TROPONIN I: 0.17 ng/mL — AB (ref <0.03)
TROPONIN I: 0.17 ng/mL — AB (ref <0.03)
TROPONIN I: 0.22 ng/mL — AB (ref <0.03)

## 2018-02-01 LAB — URINE CULTURE: CULTURE: NO GROWTH

## 2018-02-01 MED ORDER — METOPROLOL TARTRATE 5 MG/5ML IV SOLN
2.5000 mg | INTRAVENOUS | Status: DC | PRN
  Administered 2018-02-01 – 2018-02-03 (×2): 5 mg via INTRAVENOUS
  Filled 2018-02-01 (×2): qty 5

## 2018-02-01 MED ORDER — INSULIN ASPART 100 UNIT/ML ~~LOC~~ SOLN
0.0000 [IU] | SUBCUTANEOUS | Status: DC
  Administered 2018-02-01: 1 [IU] via SUBCUTANEOUS
  Administered 2018-02-01 (×2): 2 [IU] via SUBCUTANEOUS
  Administered 2018-02-02: 1 [IU] via SUBCUTANEOUS
  Administered 2018-02-02: 2 [IU] via SUBCUTANEOUS
  Administered 2018-02-02: 1 [IU] via SUBCUTANEOUS
  Administered 2018-02-02: 2 [IU] via SUBCUTANEOUS
  Filled 2018-02-01 (×7): qty 1

## 2018-02-01 MED ORDER — NITROGLYCERIN IN D5W 200-5 MCG/ML-% IV SOLN
0.0000 ug/min | INTRAVENOUS | Status: DC
  Administered 2018-02-01: 5 ug/min via INTRAVENOUS
  Administered 2018-02-01: 30 ug/min via INTRAVENOUS
  Administered 2018-02-02: 45 ug/min via INTRAVENOUS
  Filled 2018-02-01 (×3): qty 250

## 2018-02-01 MED ORDER — HYDRALAZINE HCL 20 MG/ML IJ SOLN
10.0000 mg | INTRAMUSCULAR | Status: DC | PRN
  Administered 2018-02-01 (×2): 10 mg via INTRAVENOUS
  Administered 2018-02-03: 20 mg via INTRAVENOUS
  Filled 2018-02-01 (×4): qty 1

## 2018-02-01 MED ORDER — POTASSIUM CL IN DEXTROSE 5% 20 MEQ/L IV SOLN: 20.0000 meq | INTRAVENOUS | Status: DC

## 2018-02-01 MED ORDER — ACETAMINOPHEN 325 MG PO TABS: 650.0000 mg | ORAL_TABLET | Freq: Four times a day (QID) | ORAL | Status: DC | PRN

## 2018-02-01 MED ORDER — ETHACRYNATE SODIUM 50 MG IV SOLR
100.0000 mg | Freq: Once | INTRAVENOUS | Status: AC
  Administered 2018-02-01: 100 mg via INTRAVENOUS
  Filled 2018-02-01: qty 50

## 2018-02-01 MED ORDER — ACETAMINOPHEN 650 MG RE SUPP: 650.0000 mg | Freq: Four times a day (QID) | RECTAL | Status: DC | PRN

## 2018-02-01 MED ORDER — POTASSIUM CHLORIDE 10 MEQ/50ML IV SOLN
10.0000 meq | INTRAVENOUS | Status: DC
  Administered 2018-02-01 (×3): 10 meq via INTRAVENOUS
  Filled 2018-02-01 (×4): qty 50

## 2018-02-01 MED ORDER — DEXTROSE 5 % IV SOLN
INTRAVENOUS | Status: DC
  Administered 2018-02-01: 10:00:00 via INTRAVENOUS

## 2018-02-01 MED ORDER — POTASSIUM CHLORIDE 20 MEQ/15ML (10%) PO SOLN
20.0000 meq | ORAL | Status: DC
  Administered 2018-02-01: 20 meq
  Filled 2018-02-01 (×2): qty 15

## 2018-02-01 MED ORDER — IPRATROPIUM-ALBUTEROL 0.5-2.5 (3) MG/3ML IN SOLN
3.0000 mL | Freq: Once | RESPIRATORY_TRACT | Status: AC
  Administered 2018-02-01: 3 mL via RESPIRATORY_TRACT

## 2018-02-01 NOTE — Progress Notes (Signed)
Called Dr Bard HerbertSimmonds regarding patients blood pressure. She has no prn at this time. He will take a look and enter orders.

## 2018-02-01 NOTE — Progress Notes (Signed)
Patient cuff leak confirmed and then extubated and placed on 4lpm [place].  Patient tolerated well with no adverse reactions.  HR 84 oxygen saturations 94% and RR 24.

## 2018-02-01 NOTE — Progress Notes (Signed)
[employer] [facility] [place] [facility]   PATIENT NAME: Virginia Barnes    MR#:  [id number]  DATE OF BIRTH:  September 02, 1946  SUBJECTIVE:  CHIEF COMPLAINT:   Chief Complaint  Patient presents with  . Fever  Patient intubated, no events overnight  REVIEW OF SYSTEMS:  CONSTITUTIONAL: No fever, fatigue or weakness.  EYES: No blurred or double vision.  EARS, NOSE, AND THROAT: No tinnitus or ear pain.  RESPIRATORY: No cough, shortness of breath, wheezing or hemoptysis.  CARDIOVASCULAR: No chest pain, orthopnea, edema.  GASTROINTESTINAL: No nausea, vomiting, diarrhea or abdominal pain.  GENITOURINARY: No dysuria, hematuria.  ENDOCRINE: No polyuria, nocturia,  HEMATOLOGY: No anemia, easy bruising or bleeding SKIN: No rash or lesion. MUSCULOSKELETAL: No joint pain or arthritis.   NEUROLOGIC: No tingling, numbness, weakness.  PSYCHIATRY: No anxiety or depression.   ROS  DRUG ALLERGIES:   Allergies  Allergen Reactions  . Peanuts [Peanut Oil] Shortness Of Breath  . Lasix [Furosemide] Swelling    Tongue swelling  . Lisinopril Swelling    Tongue swelling    VITALS:  Blood pressure (!) 178/59, pulse 90, temperature 99 F (37.2 C), resp. rate (!) 26, height 5\' 9"  (1.753 m), weight 98 kg (216 lb 0.8 oz), SpO2 93 %.  PHYSICAL EXAMINATION:  GENERAL:  72 y.o.-year-old patient lying in the bed with no acute distress.  EYES: Pupils equal, round, reactive to light and accommodation. No scleral icterus. Extraocular muscles intact.  HEENT: Head atraumatic, normocephalic. Oropharynx and nasopharynx clear.  NECK:  Supple, no jugular venous distention. No thyroid enlargement, no tenderness.  LUNGS: Normal breath sounds bilaterally, no wheezing, rales,rhonchi or crepitation. No use of accessory muscles of respiration.  CARDIOVASCULAR: S1, S2 normal. No murmurs, rubs, or gallops.  ABDOMEN: Soft, nontender, nondistended. Bowel sounds present. No organomegaly or mass.  EXTREMITIES: No  pedal edema, cyanosis, or clubbing.  NEUROLOGIC: Cranial nerves II through XII are intact. Muscle strength 5/5 in all extremities. Sensation intact. Gait not checked.  PSYCHIATRIC: The patient is alert and oriented x 3.  SKIN: No obvious rash, lesion, or ulcer.   Physical Exam LABORATORY PANEL:   CBC Recent Labs  Lab 02/01/18 0311  WBC 6.1  HGB 9.3*  HCT 28.7*  PLT 273   ------------------------------------------------------------------------------------------------------------------  Chemistries  Recent Labs  Lab 01/31/18 0441 02/01/18 0311  NA 139 142  K 3.4* 3.5  CL 107 108  CO2 24 26  GLUCOSE 176* 123*  BUN 16 19  CREATININE 1.02* 0.89  CALCIUM 7.9* 8.5*  MG 1.9  --   AST  --  41  ALT  --  77*  ALKPHOS  --  48  BILITOT  --  0.6   ------------------------------------------------------------------------------------------------------------------  Cardiac Enzymes Recent Labs  Lab 01/31/18 0441 02/01/18 0311  TROPONINI 0.40* 0.17*   ------------------------------------------------------------------------------------------------------------------  RADIOLOGY:  Dg Abd 1 View  Result Date: 01/30/2018 CLINICAL DATA:  Orogastric tube placement. EXAM: ABDOMEN - 1 VIEW COMPARISON:  Lumbar spine radiographs October 24, 2017 FINDINGS: Nasogastric tube tip projects in mid stomach. Included bowel gas pattern is nondilated and nonobstructive. RIGHT femoral central venous catheter in place. LEFT sciatic stimulator. No intra-abdominal mass effect or pathologic calcifications. Retrocardiac consolidation. Soft tissue planes and included osseous structures are unchanged. IMPRESSION: Nasogastric tube tip projects in mid stomach. Electronically Signed   By: Awilda Metro M.D.   On: 01/30/2018 21:51   Ct Head Wo Contrast  Result Date: 01/30/2018 CLINICAL DATA:  Congestive heart failure. Diabetes. Hypertension. Acute presentation  with confusion, lethargy and fever. EXAM: CT HEAD  WITHOUT CONTRAST TECHNIQUE: Contiguous axial images were obtained from the base of the skull through the vertex without intravenous contrast. COMPARISON:  01/11/2018 FINDINGS: Brain: No change. Generalized atrophy. Chronic small-vessel changes of the white matter. No sign of acute infarction, mass lesion, hemorrhage, hydrocephalus or extra-axial collection. Vascular: There is atherosclerotic calcification of the major vessels at the base of the brain. Skull: Negative Sinuses/Orbits: Ordinary seasonal mucosal thickening. No advanced sinusitis. Orbits negative. Other: None IMPRESSION: No acute finding by CT. Atrophy and chronic small-vessel change of the white matter. Electronically Signed   By: Paulina FusiMark  Shogry M.D.   On: 01/30/2018 19:20   Dg Chest Port 1 View  Result Date: 02/01/2018 CLINICAL DATA:  Respiratory failure EXAM: PORTABLE CHEST 1 VIEW COMPARISON:  January 31, 2018 FINDINGS: The ETT terminates 2 cm above the carina. The NG tube terminates below today's film. No pneumothorax. Dense opacity in left retrocardiac region is stable. Infiltrate in the right lung on the previous study has improved but persistent infiltrate remains in the right base. No other changes. IMPRESSION: 1. Stable support apparatus as above. 2. Persistent but improved infiltrate in the right lung. Stable infiltrate in the left retrocardiac region. Electronically Signed   By: Gerome Samavid  Williams III M.D   On: 02/01/2018 07:18   Dg Chest Port 1 View  Result Date: 01/31/2018 CLINICAL DATA:  Acute respiratory failure. EXAM: PORTABLE CHEST 1 VIEW COMPARISON:  01/30/2018. FINDINGS: Endotracheal tube and NG scratched it endotracheal tube noted with its tip 1.8 cm above the carina. NG tube noted with tip below left hemidiaphragm. Heart size stable. Diffuse right lung infiltrate and left lower lobe atelectasis again noted. Small bilateral pleural effusions. No pneumothorax. IMPRESSION: 1. Endotracheal tube tip noted 1.8 cm above the carina. NG tube  tip noted below left hemidiaphragm. 2. Diffuse right lung infiltrate. Left lower lobe atelectasis. Small bilateral pleural effusions. No change from prior exam. Electronically Signed   By: Maisie Fushomas  Register   On: 01/31/2018 06:32   Dg Chest Port 1 View  Result Date: 01/30/2018 CLINICAL DATA:  Short of breath EXAM: PORTABLE CHEST 1 VIEW COMPARISON:  01/11/2018, 11/11/2015 FINDINGS: Endotracheal tube tip projects 3.9 cm superior to the carina. Esophageal tube tip is below the diaphragm but is not included. Cardiomegaly. Development of diffuse airspace disease in the right thorax with consolidation at the left base and suspected small left effusion. Aortic atherosclerosis. No pneumothorax. IMPRESSION: 1. Endotracheal tube tip about 3.9 cm superior to carina. Esophageal tube tip is below the diaphragm but not included on the image 2. Extensive airspace disease in the right hemithorax and left lung base suspicious for multifocal pneumonia. Suspect that there is a small left effusion 3. Cardiomegaly Electronically Signed   By: Jasmine PangKim  Fujinaga M.D.   On: 01/30/2018 18:42    ASSESSMENT AND PLAN:  72 year old female with past medical history of diabetes, CHF, hypertension, previous history of diverticulitis who presents to the hospital due to fever, hypotension, altered mental status.  1.    Acute sepsis  Resolved  Weaned off vasopressors, continue sepsis protocol, empiric antibiotics, follow-up on cultures   2.  Acute respiratory failure with hypoxia secondary to pneumonia Continue ventilator with weaning as tolerated, pneumonia protocol, follow-up on cultures, breathing treatments as needed  3.  Acute HCAP Continue to pneumonia protocol, empiric antibiotics and follow-up on cultures   4.  Diabetes type 2 Controlled on current regiment  5.  Essential hypertension Stable on current  regiment  6.  GERD Stable PPI daily   All the records are reviewed and case discussed with Care  Management/Social Workerr. Management plans discussed with the patient, family and they are in agreement.  CODE STATUS: full  TOTAL TIME TAKING CARE OF THIS PATIENT: 35 minutes.     POSSIBLE D/C IN 2-5 DAYS, DEPENDING ON CLINICAL CONDITION.   Evelena Asa Marika Mahaffy M.D on 02/01/2018   Between 7am to 6pm - Pager - [phone]  After 6pm go to [email or web address] - password [email or web address]  [facility]  Office  [phone]  CC: Primary care physician; Margaretann Loveless, MD  Note: This dictation was prepared with Dragon dictation along with smaller phrase technology. Any transcriptional errors that result from this process are unintentional.

## 2018-02-01 NOTE — Significant Event (Signed)
She tolerated extubation well today for approximately 2 hours then developed progressive respiratory distress with associated hypertension.  She has a furosemide allergy.  We administered ethacrynic acid and I initiated a nitroglycerin drip.  She has been started on BiPAP.  A repeat CXR reveals increased bilateral pulmonary infiltrates consistent with edema.  EKG reveals nonspecific findings, nondiagnostic.  Troponin I is unchanged from earlier in the day at 0.17.  Impression: Acute pulmonary edema post extubation Severe hypertension  Plan: Nitroglycerin infusion Hydralazine to maintain SBP <170 mmHg Metoprolol to maintain HR <105/min Serial cardiac markers ordered Check BNP in the a.m. BiPAP as needed for ventilatory support Follow chest x-ray with repeat film in a.m.  Billy Fischeravid Yan Pankratz, MD PCCM service Mobile [phone] Pager [phone] 02/01/2018 3:07 PM

## 2018-02-01 NOTE — Progress Notes (Signed)
PT PROFILE: [age] [facility] pt admitted via ED with fever, altered cognition, acute hypoxemic respiratory failure, bilateral pulmonary infiltrates consistent with pneumonia.  Intubated in ED.  Admitted to ICU.  Initially required vasopressors.  MAJOR EVENTS/TEST RESULTS: 02/28 admission as documented above 02/28 CT head: No acute findings 03/01 weaning FiO2.  Off vasopressors.  Patient awakens easily and follows commands appropriately 03/02 Passed SBT and extubated  INDWELLING DEVICES:: ETT 02/28 >> 03/02 R femoral CVL 02/28 >>   MICRO DATA: MRSA PCR 0/28 >> NEG Flu PCR 02/28 >> NEG Urine 02/28 >> NEG  Blood 02/28 >>   ANTIMICROBIALS:  Vanc 02/28 X 1 Oseltamivir 02/28 >> 03/01 Pip-tazo 02/28 >>    SUBJ: RASS 0.  Passed SBT and extubated.  Appears comfortable on [place] O2.  RN reports oliguria   VITAL SIGNS: BP (!) 161/60   Pulse 88   Temp 99 F (37.2 C)   Resp (!) 27   Ht 5\' 9"  (1.753 m)   Wt 98 kg (216 lb 0.8 oz)   SpO2 95%   BMI 31.91 kg/m   HEMODYNAMICS:    VENTILATOR SETTINGS: Vent Mode: PSV FiO2 (%):  [36 %-50 %] 36 % Set Rate:  [16 bmp-18 bmp] 16 bmp Vt Set:  [450 mL] 450 mL PEEP:  [5 cmH20] 5 cmH20 Pressure Support:  [5 cmH20] 5 cmH20 Plateau Pressure:  [24 cmH20-25 cmH20] 24 cmH20  INTAKE / OUTPUT: I/O last 3 completed shifts: In: 5952.6 [I.V.:3895.4; NG/GT:1707.2; IV Piggyback:350] Out: 1208 [Urine:1208]  PHYSICAL EXAMINATION: General: + F/C, calm, RASS 0 Neuro: CNs intact, moves all extremities HEENT: NCAT, sclerae white Cardiovascular: Regular, no murmur Lungs: Minimal rhonchi, no wheezes Abdomen: Soft, NT, +bowel sounds Extremities: Warm, no edema Skin: No lesions noted  LABS:  BMET Recent Labs  Lab 01/30/18 1717 01/31/18 0441 02/01/18 0311  NA 136 139 142  K 4.6 3.4* 3.5  CL 102 107 108  CO2 23 24 26   BUN 14 16 19   CREATININE 0.93 1.02* 0.89  GLUCOSE 287* 176* 123*    Electrolytes Recent Labs  Lab 01/30/18 1717 01/31/18 0015  01/31/18 0441 02/01/18 0311  CALCIUM 8.5*  --  7.9* 8.5*  MG  --  2.0 1.9  --   PHOS  --  3.2 2.6  --     CBC Recent Labs  Lab 01/30/18 1717 01/31/18 0441 02/01/18 0311  WBC 8.6 8.1 6.1  HGB 11.0* 9.0* 9.3*  HCT 34.4* 27.2* 28.7*  PLT 382 241 273    Coag's Recent Labs  Lab 01/30/18 1812 01/31/18 0441  INR 1.19 1.15    Sepsis Markers Recent Labs  Lab 01/30/18 1717 01/30/18 2142  LATICACIDVEN 3.7* 0.9  PROCALCITON <0.10 3.74    ABG Recent Labs  Lab 01/30/18 1813  PHART 7.25*  PCO2ART 59*  PO2ART 86    Liver Enzymes Recent Labs  Lab 01/30/18 1717 02/01/18 0311  AST 46* 41  ALT 30 77*  ALKPHOS 56 48  BILITOT 1.2 0.6  ALBUMIN 3.4* 2.7*    Cardiac Enzymes Recent Labs  Lab 01/31/18 0015 01/31/18 0441 02/01/18 0311  TROPONINI 0.45* 0.40* 0.17*    Glucose Recent Labs  Lab 01/31/18 1621 01/31/18 2002 02/01/18 0105 02/01/18 0400 02/01/18 0659 02/01/18 1100  GLUCAP 117* 190* 160* 119* 148* 103*    CXR: Significantly improved aeration with persistent bilateral airspace disease    ASSESSMENT / PLAN:  PULMONARY A: Acute hypoxemic respiratory failure HCAP, NOS Left lower lobe atelectasis, improving P:  Extubated this morning under my direction Supplemental oxygen to maintain SPO2 >90%   CARDIOVASCULAR A:  Septic shock, resolved History of CHF Hypertension Minimally elevated troponin I-likely demand ischemia Mildly elevated BNP P:  Hydralazine as needed to maintain SBP <170 mmHg  RENAL A:   Mild AKI, creatinine improving but now oliguric Mild hypokalemia, resolved P:   Monitor BMET intermittently Monitor I/Os Correct electrolytes as indicated  IV fluids ordered  GASTROINTESTINAL A:   Norovirus enteritis  P:   SUP: N/I post extubation NPO postextubation.  Consider advancing diet later today  HEMATOLOGIC A:   Acute on chronic anemia without overt bleeding P:  DVT px: Enoxaparin Monitor CBC  intermittently Transfuse per usual guidelines   INFECTIOUS A:   Healthcare associated pneumonia, likely aspiration Severe sepsis, resolving P:   Monitor temp, WBC count Micro and abx as above   ENDOCRINE A:   Type 2 diabetes, well-controlled P:   Continue SSI -changed to sensitive scale while n.p.o.  NEUROLOGIC A:   Encephalopathy, resolved P:   RASS goal: 0 PAD protocol discontinued Minimize sedation/analgesics   FAMILY: No family at bedside   CCM time: 35 mins  The above time includes time spent in consultation with patient and/or family members and reviewing care plan on multidisciplinary rounds.  I also checked on patient's on 2 occasions during the day after extubation  Billy Fischer, MD PCCM service Mobile [phone] Pager [phone] 02/01/2018 11:50 AM

## 2018-02-01 NOTE — Progress Notes (Addendum)
Hydralazine PRN given 12:01

## 2018-02-01 NOTE — Progress Notes (Signed)
This RN was at lunch. Covering nurse called Dr Bard HerbertSimmonds at 12:35 due to continuing elevated blood pressure and hypoxia.

## 2018-02-01 NOTE — Progress Notes (Signed)
Dr. Bard HerbertSimmonds notified of patients troponin level 0.17. At this time no new orders given.

## 2018-02-01 NOTE — Progress Notes (Signed)
Patient in respitory distress. Simmonds at bedside. He will place orders. Patient currently on bipap, breathing treatment and on nitro. Getting chest xray, EKG.

## 2018-02-01 NOTE — Progress Notes (Signed)
CRITICAL VALUE ALERT  Critical Value:  Troponin I 0.22  Date & Time Notied:  02/01/18 @ 2127  Provider Notified: Dr. Denese KillingsAgarwala  Orders Received/Actions taken: Acknowledged and no new orders received at this time.

## 2018-02-01 NOTE — Progress Notes (Signed)
Spoke with Dr Bard HerbertSimmonds. Patient has had decreased urine output throughout the night. Orders to bladder scan and irrigate. Scanned patient 73 ml. Irrigated patients foley catheter. Dr Bard HerbertSimmonds aware and will review.

## 2018-02-01 NOTE — Progress Notes (Addendum)
Hydralazine PRN given 12:25

## 2018-02-02 ENCOUNTER — Inpatient Hospital Stay: Payer: Medicare HMO

## 2018-02-02 LAB — BASIC METABOLIC PANEL
ANION GAP: 11 (ref 5–15)
Anion gap: 8 (ref 5–15)
BUN: 15 mg/dL (ref 6–20)
BUN: 15 mg/dL (ref 6–20)
CALCIUM: 8.5 mg/dL — AB (ref 8.9–10.3)
CHLORIDE: 103 mmol/L (ref 101–111)
CO2: 30 mmol/L (ref 22–32)
CO2: 29 mmol/L (ref 22–32)
CREATININE: 0.89 mg/dL (ref 0.44–1.00)
Calcium: 8.6 mg/dL — ABNORMAL LOW (ref 8.9–10.3)
Chloride: 101 mmol/L (ref 101–111)
Creatinine, Ser: 0.91 mg/dL (ref 0.44–1.00)
GFR calc non Af Amer: >60 mL/min (ref >60)
GLUCOSE: 119 mg/dL — AB (ref 65–99)
Glucose, Bld: 160 mg/dL — ABNORMAL HIGH (ref 65–99)
POTASSIUM: 2.9 mmol/L — AB (ref 3.5–5.1)
Potassium: 3.7 mmol/L (ref 3.5–5.1)
Sodium: 142 mmol/L (ref 135–145)
Sodium: 140 mmol/L (ref 135–145)

## 2018-02-02 LAB — GLUCOSE, CAPILLARY
GLUCOSE-CAPILLARY: 121 mg/dL — AB (ref 65–99)
Glucose-Capillary: 109 mg/dL — ABNORMAL HIGH (ref 65–99)
Glucose-Capillary: 115 mg/dL — ABNORMAL HIGH (ref 65–99)
Glucose-Capillary: 176 mg/dL — ABNORMAL HIGH (ref 65–99)
Glucose-Capillary: 143 mg/dL — ABNORMAL HIGH (ref 65–99)

## 2018-02-02 LAB — BRAIN NATRIURETIC PEPTIDE: B NATRIURETIC PEPTIDE 5: 1075 pg/mL — AB (ref 0.0–100.0)

## 2018-02-02 LAB — TROPONIN I: TROPONIN I: 0.2 ng/mL — AB (ref <0.03)

## 2018-02-02 MED ORDER — INSULIN ASPART 100 UNIT/ML ~~LOC~~ SOLN: 0.0000 [IU] | Freq: Every day | SUBCUTANEOUS | Status: DC

## 2018-02-02 MED ORDER — INSULIN ASPART 100 UNIT/ML ~~LOC~~ SOLN
0.0000 [IU] | Freq: Three times a day (TID) | SUBCUTANEOUS | Status: DC
  Administered 2018-02-03: 2 [IU] via SUBCUTANEOUS
  Administered 2018-02-03 (×2): 3 [IU] via SUBCUTANEOUS
  Administered 2018-02-04 (×3): 2 [IU] via SUBCUTANEOUS
  Administered 2018-02-05: 3 [IU] via SUBCUTANEOUS
  Administered 2018-02-05: 2 [IU] via SUBCUTANEOUS
  Administered 2018-02-06: 5 [IU] via SUBCUTANEOUS
  Filled 2018-02-02 (×9): qty 1

## 2018-02-02 MED ORDER — FLUTICASONE PROPIONATE 50 MCG/ACT NA SUSP
2.0000 | Freq: Every day | NASAL | Status: DC
  Administered 2018-02-02 – 2018-02-06 (×5): 2 via NASAL
  Filled 2018-02-02: qty 16

## 2018-02-02 MED ORDER — SERTRALINE HCL 50 MG PO TABS
100.0000 mg | ORAL_TABLET | Freq: Every day | ORAL | Status: DC
  Administered 2018-02-02 – 2018-02-06 (×5): 100 mg via ORAL
  Filled 2018-02-02 (×5): qty 2

## 2018-02-02 MED ORDER — HYDROCHLOROTHIAZIDE 25 MG PO TABS
25.0000 mg | ORAL_TABLET | Freq: Every day | ORAL | Status: DC
  Administered 2018-02-02 – 2018-02-04 (×3): 25 mg via ORAL
  Filled 2018-02-02 (×3): qty 1

## 2018-02-02 MED ORDER — SODIUM CHLORIDE 0.9% FLUSH: 10.0000 mL | INTRAVENOUS | Status: DC | PRN

## 2018-02-02 MED ORDER — HYDRALAZINE HCL 50 MG PO TABS
25.0000 mg | ORAL_TABLET | Freq: Two times a day (BID) | ORAL | Status: DC
  Administered 2018-02-02: 25 mg via ORAL
  Filled 2018-02-02: qty 1

## 2018-02-02 MED ORDER — CALCIUM CARBONATE 1250 (500 CA) MG PO TABS
1.0000 | ORAL_TABLET | Freq: Two times a day (BID) | ORAL | Status: DC
  Administered 2018-02-03: 500 mg via ORAL
  Filled 2018-02-02 (×3): qty 1

## 2018-02-02 MED ORDER — SODIUM CHLORIDE 0.9% FLUSH
10.0000 mL | Freq: Two times a day (BID) | INTRAVENOUS | Status: DC
  Administered 2018-02-02 – 2018-02-04 (×4): 10 mL

## 2018-02-02 MED ORDER — POTASSIUM CHLORIDE 10 MEQ/50ML IV SOLN
10.0000 meq | INTRAVENOUS | Status: AC
  Administered 2018-02-02 (×8): 10 meq via INTRAVENOUS
  Filled 2018-02-02 (×8): qty 50

## 2018-02-02 MED ORDER — ASPIRIN EC 81 MG PO TBEC
81.0000 mg | DELAYED_RELEASE_TABLET | Freq: Every day | ORAL | Status: DC
  Administered 2018-02-02 – 2018-02-06 (×5): 81 mg via ORAL
  Filled 2018-02-02 (×5): qty 1

## 2018-02-02 MED ORDER — FERROUS SULFATE 325 (65 FE) MG PO TABS
325.0000 mg | ORAL_TABLET | Freq: Every day | ORAL | Status: DC
  Administered 2018-02-02 – 2018-02-06 (×5): 325 mg via ORAL
  Filled 2018-02-02 (×5): qty 1

## 2018-02-02 MED ORDER — ROSUVASTATIN CALCIUM 10 MG PO TABS
20.0000 mg | ORAL_TABLET | Freq: Every day | ORAL | Status: DC
  Administered 2018-02-02 – 2018-02-06 (×5): 20 mg via ORAL
  Filled 2018-02-02 (×5): qty 2

## 2018-02-02 NOTE — Progress Notes (Signed)
PT PROFILE: [age] [facility] pt admitted via ED with fever, altered cognition, acute hypoxemic respiratory failure, bilateral pulmonary infiltrates consistent with pneumonia.  Intubated in ED.  Admitted to ICU.  Initially required vasopressors.  MAJOR EVENTS/TEST RESULTS: 02/28 admission as documented above 02/28 CT head: No acute findings 03/01 weaning FiO2.  Off vasopressors.  Patient awakens easily and follows commands appropriately 03/02 Passed SBT and extubated  INDWELLING DEVICES:: ETT 02/28 >> 03/02 R femoral CVL 02/28 >>   MICRO DATA: MRSA PCR 0/28 >> NEG Flu PCR 02/28 >> NEG Urine 02/28 >> NEG  Blood 02/28 >>   ANTIMICROBIALS:  Vanc 02/28 X 1 Oseltamivir 02/28 >> 03/01 Pip-tazo 02/28 >>    SUBJ: Alert and awake On nitro drip Was given erythrocrinic acid for diuresis Still SOB   VITAL SIGNS: BP (!) 159/67   Pulse 97   Temp 99 F (37.2 C)   Resp (!) 31   Ht 5\' 9"  (1.753 m)   Wt 216 lb 0.8 oz (98 kg)   SpO2 97%   BMI 31.91 kg/m   HEMODYNAMICS:    VENTILATOR SETTINGS: FiO2 (%):  [36 %-60 %] 36 %  INTAKE / OUTPUT: I/O last 3 completed shifts: In: 1438.7 [I.V.:372.9; NG/GT:865.8; IV Piggyback:200] Out: 4135 [Urine:4135]  PHYSICAL EXAMINATION: General: + F/C, calm, RASS 0 Neuro: CNs intact, moves all extremities HEENT: NCAT, sclerae white Cardiovascular: Regular, no murmur Lungs: Minimal rhonchi, no wheezes Abdomen: Soft, NT, +bowel sounds Extremities: Warm, no edema Skin: No lesions noted  LABS:  BMET Recent Labs  Lab 01/31/18 0441 02/01/18 0311 02/02/18 0402  NA 139 142 142  K 3.4* 3.5 2.9*  CL 107 108 101  CO2 24 26 30   BUN 16 19 15   CREATININE 1.02* 0.89 0.91  GLUCOSE 176* 123* 119*    Electrolytes Recent Labs  Lab 01/31/18 0015 01/31/18 0441 02/01/18 0311 02/02/18 0402  CALCIUM  --  7.9* 8.5* 8.5*  MG 2.0 1.9  --   --   PHOS 3.2 2.6  --   --     CBC Recent Labs  Lab 01/30/18 1717 01/31/18 0441 02/01/18 0311  WBC 8.6 8.1  6.1  HGB 11.0* 9.0* 9.3*  HCT 34.4* 27.2* 28.7*  PLT 382 241 273    Coag's Recent Labs  Lab 01/30/18 1812 01/31/18 0441  INR 1.19 1.15    Sepsis Markers Recent Labs  Lab 01/30/18 1717 01/30/18 2142  LATICACIDVEN 3.7* 0.9  PROCALCITON <0.10 3.74    ABG Recent Labs  Lab 01/30/18 1813  PHART 7.25*  PCO2ART 59*  PO2ART 86    Liver Enzymes Recent Labs  Lab 01/30/18 1717 02/01/18 0311  AST 46* 41  ALT 30 77*  ALKPHOS 56 48  BILITOT 1.2 0.6  ALBUMIN 3.4* 2.7*    Cardiac Enzymes Recent Labs  Lab 02/01/18 1343 02/01/18 1924 02/02/18 0223  TROPONINI 0.17* 0.22* 0.20*    Glucose Recent Labs  Lab 02/01/18 1100 02/01/18 1559 02/01/18 1929 02/01/18 2349 02/02/18 0427 02/02/18 0713  GLUCAP 103* 178* 187* 146* 109* 115*    CXR: Significantly improved aeration with persistent bilateral airspace disease   ASSESSMENT / PLAN:  PULMONARY A: Acute hypoxemic respiratory failure HCAP, NOS Left lower lobe atelectasis, improving P:   Extubated this morning under my direction Supplemental oxygen to maintain SPO2 >90%   CARDIOVASCULAR A:  Septic shock, resolved History of CHF Hypertension Minimally elevated troponin I-likely demand ischemia Mildly elevated BNP P:  Hydralazine as needed to maintain SBP <170  mmHg CHF exacerbation -supposedly allergic to lasix Will watch closely  GASTROINTESTINAL A:   Norovirus enteritis  P:   SUP: N/I post extubation NPO postextubation.  Consider advancing diet later today  HEMATOLOGIC A:   Acute on chronic anemia without overt bleeding P:  DVT px: Enoxaparin Monitor CBC intermittently Transfuse per usual guidelines   INFECTIOUS A:   Healthcare associated pneumonia, likely aspiration Severe sepsis, resolving P:   Monitor temp, WBC count Micro and abx as above   Remains SD status  Lucie Leather, M.D.  [facility]  Medical Director [facility] Medical  Director [facility] Cardio-Pulmonary Department

## 2018-02-02 NOTE — Clinical Social Work Note (Signed)
Clinical Social Work Assessment  Patient Details  Name: Virginia CreeGlenda C Melnyk MRN: [id number] Date of Birth: Apr 06, 1946  Date of referral:  02/02/18               Reason for consult:  Facility Placement                Permission sought to share information with:  Oceanographeracility Contact Representative Permission granted to share information::  Yes, Verbal Permission Granted  Name::        Agency::     Relationship::  [facility]  Contact Information:     Housing/Transportation Living arrangements for the past 2 months:  [facility] Source of Information:  Medical Team, Adult Children, Facility Patient Interpreter Needed:  None Criminal Activity/Legal Involvement Pertinent to Current Situation/Hospitalization:  No - Comment as needed Significant Relationships:  Adult Children, Church, [employer] Support Lives with:  Facility Resident Do you feel safe going back to the place where you live?  Yes Need for family participation in patient care:  Yes (Comment)(Patient not alert)  Care giving concerns: CSW aware through chart review that patient admitted from an ALF   Social Worker assessment / plan:  The CSW attempted to visit the patient at bedside; however, the patient was sleeping soundly and no family was present. The CSW contacted the patient's son, [name], who confirmed that his mother is a resident of [facility]. The plan is for the patient to return when stable. Currently, the patient is in CCU and was extubated yesterday. The patient is on a Bipap at this time due to acute pulmonary embolism.  PT is pending for the patient. Should the PT recommend SNF, the CSW will discuss such with the family and patient. Currently, the discharge plan is back to the ALF when stable. The ALF will have to assess the patient prior to readmission as she has been out of the facility over 48 hours.   Employment status:  Retired Health and safety inspectornsurance information:  Medicare PT Recommendations:  Not  assessed at this time Information / Referral to community resources:     Patient/Family's Response to care:  *The patient's son thanked the CSW for assistance.  Patient/Family's Understanding of and Emotional Response to Diagnosis, Current Treatment, and Prognosis:  The family is in agreement with return to the ALF. PT is pending.  Emotional Assessment Appearance:  Appears stated age Attitude/Demeanor/Rapport:  Lethargic Affect (typically observed):  Withdrawn Orientation:  Oriented to Self, Oriented to Place, Oriented to  Time, Oriented to Situation Alcohol / Substance use:  Never Used Psych involvement (Current and /or in the community):  No (Comment)  Discharge Needs  Concerns to be addressed:  Care Coordination, Discharge Planning Concerns Readmission within the last 30 days:  Yes Current discharge risk:  Chronically ill Barriers to Discharge:  Continued Medical Work up   UAL CorporationKaren M Shakai Dolley, LCSW 02/02/2018, 3:07 PM

## 2018-02-02 NOTE — Progress Notes (Signed)
[facility]OTOCOL FOR AM LAB REPLACEMENT ONLY  The patient does apply for the [facility] Electrolyte Replacment Protocol based on the criteria listed below:   1. Is GFR >/= 40 ml/min? Yes.    Patient's GFR today is >60 2. Is urine output >/= 0.5 ml/kg/hr for the last 6 hours? Yes.   Patient's UOP is 0.9 ml/kg/hr 3. Is BUN < 60 mg/dL? Yes.    Patient's BUN today is 15 4. Abnormal electrolyte(s): K2.9 5. Ordered repletion with: per protocol 6. If a panic level lab has been reported, has the CCM MD in charge been notified? Yes.  .   Physician:  R Agarwala,MD  Virginia NakayamaChisholm, Virginia Barnes 02/02/2018 6:37 AM

## 2018-02-02 NOTE — Progress Notes (Signed)
[facility] [place] [facility]   PATIENT NAME: Virginia Barnes    MR#:  [id number]  DATE OF BIRTH:  [birth year]  SUBJECTIVE:  CHIEF COMPLAINT:   Chief Complaint  Patient presents with  . Fever  Currently on BiPAP, discussed with intensivist  REVIEW OF SYSTEMS:  CONSTITUTIONAL: No fever, fatigue or weakness.  EYES: No blurred or double vision.  EARS, NOSE, AND THROAT: No tinnitus or ear pain.  RESPIRATORY: No cough, shortness of breath, wheezing or hemoptysis.  CARDIOVASCULAR: No chest pain, orthopnea, edema.  GASTROINTESTINAL: No nausea, vomiting, diarrhea or abdominal pain.  GENITOURINARY: No dysuria, hematuria.  ENDOCRINE: No polyuria, nocturia,  HEMATOLOGY: No anemia, easy bruising or bleeding SKIN: No rash or lesion. MUSCULOSKELETAL: No joint pain or arthritis.   NEUROLOGIC: No tingling, numbness, weakness.  PSYCHIATRY: No anxiety or depression.   ROS  DRUG ALLERGIES:   Allergies  Allergen Reactions  . Peanuts [Peanut Oil] Shortness Of Breath  . Lasix [Furosemide] Swelling    Tongue swelling  . Lisinopril Swelling    Tongue swelling    VITALS:  Blood pressure 129/64, pulse 97, temperature 99 F (37.2 C), resp. rate (!) 31, height 5\' 9"  (1.753 m), weight 98 kg (216 lb 0.8 oz), SpO2 96 %.  PHYSICAL EXAMINATION:  GENERAL:  72 y.o.-year-old patient lying in the bed with no acute distress.  EYES: Pupils equal, round, reactive to light and accommodation. No scleral icterus. Extraocular muscles intact.  HEENT: Head atraumatic, normocephalic. Oropharynx and nasopharynx clear.  NECK:  Supple, no jugular venous distention. No thyroid enlargement, no tenderness.  LUNGS: Normal breath sounds bilaterally, no wheezing, rales,rhonchi or crepitation. No use of accessory muscles of respiration.  CARDIOVASCULAR: S1, S2 normal. No murmurs, rubs, or gallops.  ABDOMEN: Soft, nontender, nondistended. Bowel sounds present. No organomegaly or mass.  EXTREMITIES: No  pedal edema, cyanosis, or clubbing.  NEUROLOGIC: Cranial nerves II through XII are intact. Muscle strength 5/5 in all extremities. Sensation intact. Gait not checked.  PSYCHIATRIC: The patient is alert and oriented x 3.  SKIN: No obvious rash, lesion, or ulcer.   Physical Exam LABORATORY PANEL:   CBC Recent Labs  Lab 02/01/18 0311  WBC 6.1  HGB 9.3*  HCT 28.7*  PLT 273   ------------------------------------------------------------------------------------------------------------------  Chemistries  Recent Labs  Lab 01/31/18 0441 02/01/18 0311 02/02/18 0402  NA 139 142 142  K 3.4* 3.5 2.9*  CL 107 108 101  CO2 24 26 30   GLUCOSE 176* 123* 119*  BUN 16 19 15   CREATININE 1.02* 0.89 0.91  CALCIUM 7.9* 8.5* 8.5*  MG 1.9  --   --   AST  --  41  --   ALT  --  77*  --   ALKPHOS  --  48  --   BILITOT  --  0.6  --    ------------------------------------------------------------------------------------------------------------------  Cardiac Enzymes Recent Labs  Lab 02/01/18 1924 02/02/18 0223  TROPONINI 0.22* 0.20*   ------------------------------------------------------------------------------------------------------------------  RADIOLOGY:  Dg Chest Port 1 View  Result Date: 02/02/2018 CLINICAL DATA:  72 year old female admitted with respiratory failure, fever, hypotension. Acute pulmonary edema and severe hypertension status post extubation yesterday. EXAM: PORTABLE CHEST 1 VIEW COMPARISON:  02/01/2018 and earlier. FINDINGS: Portable AP semi upright view at 0603 hours. Stable lung volumes. Regressed but not resolved bilateral perihilar and basilar predominant indistinct pulmonary opacity. No pneumothorax. No large pleural effusion. There is confluent retrocardiac opacity. Stable cardiac size and mediastinal contours. Calcified aortic atherosclerosis. No acute osseous abnormality  identified. IMPRESSION: 1. Regressed bilateral perihilar and basilar interstitial opacity since  yesterday compatible with regression of pulmonary edema. 2. Left lower lobe collapse or consolidation. Electronically Signed   By: Odessa FlemingH  Hall M.D.   On: 02/02/2018 07:34   Dg Chest Port 1 View  Result Date: 02/01/2018 CLINICAL DATA:  Hypoxia, on BiPAP EXAM: PORTABLE CHEST 1 VIEW COMPARISON:  02/01/2018 at 0410 hours FINDINGS: Progressive interstitial/perihilar opacities, compatible with mild to moderate interstitial edema, increased. Suspected small left pleural effusion. Retrocardiac region is poorly evaluated. Interval extubation and removal of enteric tube. Cardiomegaly. IMPRESSION: Interval extubation and removal of enteric tube. Cardiomegaly with suspected mild to moderate interstitial edema, increased. Small left pleural effusion. Electronically Signed   By: Charline BillsSriyesh  Krishnan M.D.   On: 02/01/2018 16:20   Dg Chest Port 1 View  Result Date: 02/01/2018 CLINICAL DATA:  Respiratory failure EXAM: PORTABLE CHEST 1 VIEW COMPARISON:  January 31, 2018 FINDINGS: The ETT terminates 2 cm above the carina. The NG tube terminates below today's film. No pneumothorax. Dense opacity in left retrocardiac region is stable. Infiltrate in the right lung on the previous study has improved but persistent infiltrate remains in the right base. No other changes. IMPRESSION: 1. Stable support apparatus as above. 2. Persistent but improved infiltrate in the right lung. Stable infiltrate in the left retrocardiac region. Electronically Signed   By: Gerome Samavid  Williams III M.D   On: 02/01/2018 07:18    ASSESSMENT AND PLAN:  72 year old female with past medical history of diabetes, CHF, hypertension, previous history of diverticulitis who presents to the hospital due to fever, hypotension, altered mental status.  1.   Acute sepsis  Resolved  Weaned off vasopressors, continue sepsis protocol, empiric antibiotics, follow-up on cultures   2. Acute respiratory failure with hypoxia secondary to pneumonia Status post extubation February 02, 2018 Continue supplemental oxygen with weaning as tolerated  3. Acute HCAP Continue to pneumonia protocol, empiric antibiotics and follow-up on cultures   4. Diabetes type 2 Controlled on current regiment  5. Essential hypertension Stable on current regiment  6. GERD Stable PPI daily  7.  Acute Norovirus enteritis Continue supportive care   All the records are reviewed and case discussed with Care Management/Social Workerr. Management plans discussed with the patient, family and they are in agreement.  CODE STATUS: full  TOTAL TIME TAKING CARE OF THIS PATIENT: 35 minutes.     POSSIBLE D/C IN 2-3 DAYS, DEPENDING ON CLINICAL CONDITION.   Evelena AsaMontell D April Colter M.D on 02/02/2018   Between 7am to 6pm - Pager - [phone]  After 6pm go to [email or web address] - password [email or web address]  [facility]  Office  [phone]  CC: Primary care physician; Margaretann LovelessKhan, Neelam S, MD  Note: This dictation was prepared with Dragon dictation along with smaller phrase technology. Any transcriptional errors that result from this process are unintentional.

## 2018-02-02 NOTE — Progress Notes (Signed)
PT Cancellation Note  Patient Details Name: Virginia Barnes MRN: [id number] DOB: October 19, 1946   Cancelled Treatment:    Reason Eval/Treat Not Completed: Patient not medically ready;Medical issues which prohibited therapy. Chart reviewed, K+: 2.9 down-trending since yesterday. No updated lab value in 12 hours. K+ outside of range for OOB assessment PT at this time. Will hold evaluation at this time and attempt again at later date/time as appropriate.  3:53 PM, 02/02/18 Rosamaria LintsAllan C Buccola, PT, DPT Physical Therapist - [facility]  [phone] (ASCOM)     Buccola,Allan C 02/02/2018, 3:52 PM

## 2018-02-02 NOTE — Progress Notes (Signed)
Spoke with Dr Belia HemanKasa. Orders to enter SLP and PT consults for patient. Orders to maintain systolic BP under 170 and to titrate down nitro. Replacing potassium.

## 2018-02-03 LAB — BASIC METABOLIC PANEL
Anion gap: 11 (ref 5–15)
BUN: 15 mg/dL (ref 6–20)
CHLORIDE: 103 mmol/L (ref 101–111)
CO2: 28 mmol/L (ref 22–32)
CREATININE: 0.89 mg/dL (ref 0.44–1.00)
Calcium: 8.6 mg/dL — ABNORMAL LOW (ref 8.9–10.3)
GFR calc Af Amer: >60 mL/min (ref >60)
GFR calc non Af Amer: >60 mL/min (ref >60)
Glucose, Bld: 151 mg/dL — ABNORMAL HIGH (ref 65–99)
Potassium: 3.6 mmol/L (ref 3.5–5.1)
SODIUM: 142 mmol/L (ref 135–145)

## 2018-02-03 LAB — GLUCOSE, CAPILLARY
GLUCOSE-CAPILLARY: 154 mg/dL — AB (ref 65–99)
Glucose-Capillary: 159 mg/dL — ABNORMAL HIGH (ref 65–99)
Glucose-Capillary: 170 mg/dL — ABNORMAL HIGH (ref 65–99)
Glucose-Capillary: 197 mg/dL — ABNORMAL HIGH (ref 65–99)

## 2018-02-03 MED ORDER — PREMIER PROTEIN SHAKE
11.0000 [oz_av] | Freq: Two times a day (BID) | ORAL | Status: DC
  Administered 2018-02-03 – 2018-02-06 (×3): 11 [oz_av] via ORAL

## 2018-02-03 MED ORDER — CALCIUM CARBONATE ANTACID 500 MG PO CHEW
2.5000 | CHEWABLE_TABLET | Freq: Two times a day (BID) | ORAL | Status: DC
  Administered 2018-02-03 – 2018-02-06 (×6): 500 mg via ORAL
  Filled 2018-02-03 (×2): qty 3
  Filled 2018-02-03: qty 2.5
  Filled 2018-02-03 (×4): qty 3

## 2018-02-03 MED ORDER — HYDRALAZINE HCL 50 MG PO TABS
50.0000 mg | ORAL_TABLET | Freq: Three times a day (TID) | ORAL | Status: DC
Start: 2018-02-03 — End: 2018-02-04
  Administered 2018-02-03 – 2018-02-04 (×3): 50 mg via ORAL
  Filled 2018-02-03 (×3): qty 1

## 2018-02-03 MED ORDER — ADULT MULTIVITAMIN W/MINERALS CH
1.0000 | ORAL_TABLET | Freq: Every day | ORAL | Status: DC
  Administered 2018-02-04 – 2018-02-06 (×3): 1 via ORAL
  Filled 2018-02-03 (×3): qty 1

## 2018-02-03 NOTE — Progress Notes (Addendum)
Physical Therapy Evaluation Patient Details Name: Virginia Barnes MRN: [id number] DOB: Aug 26, 1946 Today's Date: 02/03/2018   History of Present Illness  Virginia Barnes  is a 72 y.o. female with a known history of congestive heart failure, diabetes mellitus type 2, diverticulitis, hypertension is a resident of [facility] facility.  Patient was found to be confused and lethargic and also had fever of 101.9 F.  She was referred to the emergency room at our hospital she was hypoxic on oxygen by nasal cannula at 4 L with decreased responsiveness.  Patient was febrile and blood pressure was low around 64 / 45 mmHg.  Patient received IV fluid boluses in the emergency room and her lactic acid level was elevated.  She was found to be in septic shock and was started on IV Levophed drip and IV fluids were started based on sepsis protocol.  Code sepsis was called.  Patient was intubated electively and put on ventilator as she was not able to maintain airway and oxygen saturation.  Her blood pressure improved to 100/49 mmHg after fluid boluses and IV Levophed drip was started.  Patient was put on IV propofol drip for sedation.  Hospitalist service was consulted.  CT head without contrast was also ordered in the emergency room without any acute findings. Pt is now admitted for acute sepsis, acute respiratory failure with hypoxia, acute HCAP, and acute norovirus enteritis.  Clinical Impression  Pt admitted with above diagnosis. Pt currently with functional limitations due to the deficits listed below (see PT Problem List).  Pt requires modA+1 for bed mobility and modA+2 for transfers. She is very unsteady in standing with heavy posterior leaning during first transfer. Unable to remain upright. With second transfer pt able to demonstrate improved anterior weight shifting with cues. She requires minA+1 to remain upright and stays in standing with bilateral UE support on rolling walker. Pt able to take a few small shuffle  steps to her R side to move up toward HOB. With attempts at marching pt falls backwards and demonstrates minimal toe to floor clearance. Pt unable/unsafe to ambulate at this time. Pt is significantly below her baseline and would benefit from SNF placement at discharge. Attempted to remove pt from supplemental O2 however SaO2 drops to 89% on room air at rest so O2 reapplied at 2L/min via [place]. Pt will benefit from PT services to address deficits in strength, balance, and mobility in order to return to full function at home.       Follow Up Recommendations SNF    Equipment Recommendations  Other (comment)(Would benefit from RW instead of rollator at facility)    Recommendations for Other Services       Precautions / Restrictions Precautions Precautions: Fall Restrictions Weight Bearing Restrictions: No      Mobility  Bed Mobility Overal bed mobility: Needs Assistance Bed Mobility: Supine to Sit;Sit to Supine     Supine to sit: Mod assist Sit to supine: Mod assist   General bed mobility comments: Heavy cues for hand placement, sequencing, and to scoot forward to EOB with weight shifting from side to side  Transfers Overall transfer level: Needs assistance Equipment used: Rolling walker (2 wheeled) Transfers: Sit to/from Stand Sit to Stand: Mod assist;+2 physical assistance         General transfer comment: ModA+2 for sit to stand transfers. Pt is very unsteady in standing with heavy leaning posteriorly during first transfer. Unable to remain upright. With second transfer pt able to demonstrate improved anterior  weight shifting with cues. She requires minA+1 to remain upright and stays in standing with bilateral UE support on rolling walker. Pt able to take a few small shuffle steps to her R side to move up toward HOB. With attempts at marching pt falls backwards and demonstrates minimal toe to floor clearance. Pt unable/unsafe to ambulate at this time  Ambulation/Gait              General Gait Details: Unable/unsafe to attempt  Stairs            Wheelchair Mobility    Modified Rankin (Stroke Patients Only)       Balance Overall balance assessment: Needs assistance Sitting-balance support: Bilateral upper extremity supported;Feet supported Sitting balance-Leahy Scale: Fair     Standing balance support: Bilateral upper extremity supported Standing balance-Leahy Scale: Poor Standing balance comment: Posterior instability in standing requiring minA+1 to correct                             Pertinent Vitals/Pain Pain Assessment: No/denies pain    Home Living Family/patient expects to be discharged to:: Assisted living([facility])               Home Equipment: Dan HumphreysWalker - 4 wheels      Prior Function Level of Independence: Needs assistance   Gait / Transfers Assistance Needed: Mod Ind amb facility distances with rollator. Pt reports 4 recent falls at facility (approximately 5 weeks ago).  ADL's / Homemaking Assistance Needed: Assistance provided by ALF staff for all ADLs/IADLs        Hand Dominance   Dominant Hand: Right    Extremity/Trunk Assessment   Upper Extremity Assessment Upper Extremity Assessment: Generalized weakness    Lower Extremity Assessment Lower Extremity Assessment: Generalized weakness       Communication   Communication: No difficulties  Cognition Arousal/Alertness: Lethargic Behavior During Therapy: WFL for tasks assessed/performed Overall Cognitive Status: No family/caregiver present to determine baseline cognitive functioning                                 General Comments: AOx2, disoriented to month and year, partially oriented to situation      General Comments      Exercises General Exercises - Lower Extremity Ankle Circles/Pumps: Both;10 reps Short Arc Quad: Both;10 reps Heel Slides: Both;10 reps Hip ABduction/ADduction: Both;10 reps Straight Leg Raises:  Both;10 reps   Assessment/Plan    PT Assessment Patient needs continued PT services  PT Problem List Decreased strength;Decreased activity tolerance;Decreased balance;Decreased mobility       PT Treatment Interventions DME instruction;Gait training;Functional mobility training;Balance training;Therapeutic exercise;Therapeutic activities;Patient/family education;Neuromuscular re-education    PT Goals (Current goals can be found in the Care Plan section)  Acute Rehab PT Goals Patient Stated Goal: Return to prior function at ALF PT Goal Formulation: With patient Time For Goal Achievement: 02/17/18 Potential to Achieve Goals: Good    Frequency Min 2X/week   Barriers to discharge        Co-evaluation               AM-PAC PT "6 Clicks" Daily Activity  Outcome Measure Difficulty turning over in bed (including adjusting bedclothes, sheets and blankets)?: Unable Difficulty moving from lying on back to sitting on the side of the bed? : Unable Difficulty sitting down on and standing up from a chair with arms (e.g.,  wheelchair, bedside commode, etc,.)?: Unable Help needed moving to and from a bed to chair (including a wheelchair)?: A Lot Help needed walking in hospital room?: Total Help needed climbing 3-5 steps with a railing? : Total 6 Click Score: 7    End of Session Equipment Utilized During Treatment: Gait belt;Oxygen Activity Tolerance: Patient limited by lethargy Patient left: in bed;with call bell/phone within reach;with bed alarm set Nurse Communication: Mobility status;Other (comment)(RN present to observe) PT Visit Diagnosis: Unsteadiness on feet (R26.81);Muscle weakness (generalized) (M62.81);Difficulty in walking, not elsewhere classified (R26.2)    Time: 1610-9604 PT Time Calculation (min) (ACUTE ONLY): 27 min   Charges:   PT Evaluation $PT Eval Moderate Complexity: 1 Mod PT Treatments $Therapeutic Exercise: 8-22 mins   PT G Codes:        Sharalyn Ink  Mozes Sagar PT, DPT    Davinity Fanara 02/03/2018, 10:45 AM

## 2018-02-03 NOTE — Progress Notes (Signed)
PT PROFILE: [age] [facility] pt admitted via ED with fever, altered cognition, acute hypoxemic respiratory failure, bilateral pulmonary infiltrates consistent with pneumonia.  Intubated in ED.  Admitted to ICU.  Initially required vasopressors.  MAJOR EVENTS/TEST RESULTS: 02/28 admission as documented above 02/28 CT head: No acute findings 03/01 weaning FiO2.  Off vasopressors.  Patient awakens easily and follows commands appropriately 03/02 Passed SBT and extubated Working with PT  INDWELLING DEVICES:: ETT 02/28 >> 03/02 R femoral CVL 02/28 >>   MICRO DATA: MRSA PCR 0/28 >> NEG Flu PCR 02/28 >> NEG Urine 02/28 >> NEG  Blood 02/28 >>   ANTIMICROBIALS:  Vanc 02/28 X 1 Oseltamivir 02/28 >> 03/01 Pip-tazo 02/28 >>    SUBJ: Alert and awake SOB resolved Plan to transfer to gen med floor  VITAL SIGNS: BP (!) 166/82   Pulse 91   Temp 98.6 F (37 C) (Oral)   Resp (!) 33   Ht 5\' 9"  (1.753 m)   Wt 216 lb 0.8 oz (98 kg)   SpO2 95%   BMI 31.91 kg/m   HEMODYNAMICS:    VENTILATOR SETTINGS: FiO2 (%):  [36 %] 36 %  INTAKE / OUTPUT: I/O last 3 completed shifts: In: 815.1 [P.O.:200; I.V.:315.1; IV Piggyback:300] Out: 1750 [Urine:1750]  PHYSICAL EXAMINATION: General:AA0x3 Neuro: CNs intact, moves all extremities HEENT: NCAT, sclerae white Cardiovascular: Regular, no murmur Lungs: Minimal rhonchi, no wheezes Abdomen: Soft, NT, +bowel sounds Extremities: Warm, no edema Skin: No lesions noted  LABS:  BMET Recent Labs  Lab 02/02/18 0402 02/02/18 1948 02/03/18 0511  NA 142 140 142  K 2.9* 3.7 3.6  CL 101 103 103  CO2 30 29 28   BUN 15 15 15   CREATININE 0.91 0.89 0.89  GLUCOSE 119* 160* 151*    Electrolytes Recent Labs  Lab 01/31/18 0015 01/31/18 0441  02/02/18 0402 02/02/18 1948 02/03/18 0511  CALCIUM  --  7.9*   < > 8.5* 8.6* 8.6*  MG 2.0 1.9  --   --   --   --   PHOS 3.2 2.6  --   --   --   --    < > = values in this interval not displayed.     CBC Recent Labs  Lab 01/30/18 1717 01/31/18 0441 02/01/18 0311  WBC 8.6 8.1 6.1  HGB 11.0* 9.0* 9.3*  HCT 34.4* 27.2* 28.7*  PLT 382 241 273    Coag's Recent Labs  Lab 01/30/18 1812 01/31/18 0441  INR 1.19 1.15    Sepsis Markers Recent Labs  Lab 01/30/18 1717 01/30/18 2142  LATICACIDVEN 3.7* 0.9  PROCALCITON <0.10 3.74    ABG Recent Labs  Lab 01/30/18 1813  PHART 7.25*  PCO2ART 59*  PO2ART 86    Liver Enzymes Recent Labs  Lab 01/30/18 1717 02/01/18 0311  AST 46* 41  ALT 30 77*  ALKPHOS 56 48  BILITOT 1.2 0.6  ALBUMIN 3.4* 2.7*    Cardiac Enzymes Recent Labs  Lab 02/01/18 1343 02/01/18 1924 02/02/18 0223  TROPONINI 0.17* 0.22* 0.20*    Glucose Recent Labs  Lab 02/02/18 0427 02/02/18 0713 02/02/18 1105 02/02/18 1605 02/02/18 1946 02/03/18 0747  GLUCAP 109* 115* 121* 176* 143* 159*    CXR: Significantly improved aeration with persistent bilateral airspace disease   ASSESSMENT / PLAN:  Acute hypoxemic respiratory failure HCAP, NOS Left lower lobe atelectasis- improving Oxygen as needed Continue PT  Ok to transfer to gen med floor   Virginia Barnes, M.D.  [facility] GublerLebauer  Pulmonary & Critical Care Medicine  Medical Director [facility] Medical Director [facility] Cardio-Pulmonary Department

## 2018-02-03 NOTE — Progress Notes (Signed)
Nutrition Follow-up  DOCUMENTATION CODES:   Obesity unspecified  INTERVENTION:  Provide Premier protein po BID, each supplement provides 160 kcal and 30 grams of protein.  Provide daily MVI.  NUTRITION DIAGNOSIS:   Inadequate oral intake related to decreased appetite as evidenced by per patient/family report.  New nutrition diagnosis.  GOAL:   Patient will meet greater than or equal to 90% of their needs  Progressing.  MONITOR:   PO intake, Supplement acceptance, Labs, Weight trends, Skin, I & O's  REASON FOR ASSESSMENT:   Ventilator    ASSESSMENT:   72 year old elderly female patient with history of congestive heart failure, diverticular disease, hypertension, diabetes mellitus type 2 who is a resident of [place] facility was referred for lethargy, confusion, fever and admitting diagnosis. Septic shock, acute respiratory failure, hypotension now ventilated   -Patient was extubated on 3/2. -Following SLP evaluation 3/4 patient was downgraded to dysphagia 3 diet with thin liquids.  Met with patient at bedside. She was somewhat lethargic but able to answer some questions. She reports her appetite has not quite yet returned to normal. She ate about 50% of her breakfast this morning but cannot recall which parts she ate. She reports she may have lost some weight PTA but is unable to provide details. Per chart she was 229.3 lbs on 01/11/2018 and has lost approximately 13 lbs (5.8% body weight) over the past month. In setting of CHF may have been some fluctuations from fluid. Patient is amenable to drinking Premier Protein to help meet her calorie/protein needs while her appetite is decreased. She has a peanut allergy. Denies any other food allergies or intolerances.  Meal Completion: 50% of breakfast this morning  Medications reviewed and include: Oscal 1 tablet BID, ferrous sulfate 325 mg daily, Novolog 0-15 units TID, Novolog 0-5 units QHS, sertraline, nitroglycerin gtt,  Zosyn.  Labs reviewed: CBG 115-176.  I/O: 950 mL UOP yesterday (0.4 mL/kg/hr)  Weight trend: last wt was 98 kg on 3/2; -1.8 kg from admission  Discussed with RN and on rounds.  Diet Order:  DIET DYS 3 Room service appropriate? Yes with Assist; Fluid consistency: Thin Aspiration precautions  EDUCATION NEEDS:   Not appropriate for education at this time  Skin:  Skin Assessment: Reviewed RN Assessment(ecchymosis to abdomen)  Last BM:  02/02/2018 - small type 6  Height:   Ht Readings from Last 1 Encounters:  01/30/18 _0  (1.753 m)    Weight:   Wt Readings from Last 1 Encounters:  02/01/18 216 lb 0.8 oz (98 kg)    Ideal Body Weight:  65.9 kg  BMI:  Body mass index is 31.91 kg/m.  Estimated Nutritional Needs:   Kcal:  1870-2030 (MSJ x 1.2-1.3)  Protein:  90-105 grams (0.9-1.1 grams/kg)  Fluid:  1.6 L/day (25 mL/kg IBW)  Willey Blade, MS, RD, LDN Office: [phone] Pager: [phone] After Hours/Weekend Pager: [phone]

## 2018-02-03 NOTE — Evaluation (Signed)
Clinical/Bedside Swallow Evaluation Patient Details  Name: Virginia Barnes MRN: [id number] Date of Birth: [birth year]  Today's Date: 02/03/2018 Time: SLP Start Time (ACUTE ONLY): 1000 SLP Stop Time (ACUTE ONLY): 1100 SLP Time Calculation (min) (ACUTE ONLY): 60 min  Past Medical History:  Past Medical History:  Diagnosis Date  . CHF (congestive heart failure) (HCC)   . Diabetes mellitus without complication (HCC)   . Diverticulitis   . Hypertension    Past Surgical History:  Past Surgical History:  Procedure Laterality Date  . ABDOMINAL HYSTERECTOMY    . CARDIAC CATHETERIZATION Left 04/13/2016   Procedure: Left Heart Cath and Coronary Angiography;  Surgeon: Laurier Nancy, MD;  Location: [facility] INVASIVE CV LAB;  Service: Cardiovascular;  Laterality: Left;  . JOINT REPLACEMENT     HPI:  Pt is a 72 y.o. female with a known history of congestive heart failure, diabetes mellitus type 2, diverticulitis, hypertension is a resident of [facility].  Patient was found to be confused and lethargic and also had fever of 101.9 F.  She was referred to the emergency room at our hospital she was hypoxic on oxygen by nasal cannula at 4 L with decreased responsiveness.  Patient was febrile and blood pressure was low around 64 / 45 mmHg.  Patient received IV fluid boluses in the emergency room and her lactic acid level was elevated.  She was found to be in septic shock and was started on IV Levophed drip and IV fluids were started based on sepsis protocol.  Code sepsis was called.  Patient was intubated electively and put on ventilator as she was not able to maintain airway and oxygen saturation.  Her blood pressure improved to 100 x 49 mmHg after fluid boluses and IV Levophed drip was started.  Patient was put on IV propofol drip for sedation.  Pt is now extubated and awake. She has eaten bites/sips w/ NSG per report.  Pt was recently admitted on 01/12/2018 for Altered mental  status/encephalopathy-secondary to severe hypoglycemia. Unsure of pt's baseline Cognitive status; noted recent Head CT on 01/30/2018 - resides at [facility].   Assessment / Plan / Recommendation Clinical Impression  Pt appears to present w/ adequate oropharyngeal phase swallow function w/ no overt s/s of aspiration noted during po trials. Pt appears at reduced risk for aspiration if following general aspiration precautions w/ po's. Pt consumed po trials of thin liquids, purees and soft solids w/ no overt s/s of aspiration noted during/post trials. No decline in respiratory status noted; vocal quality clear b/t trials when assessed. During the oral phase, pt appeared to exhibit timely bolus management for A-P transfer and swallowing; oral clearing appropriate b/t trials given time. Pt demonstrated min increased mastication time/effort w/ increased textured trials which is baseline per her report - she is missing lower dentition. Pt required some assistance w/ feeding/drinking d/t overall weakness. Would recommend a mech soft consistency diet (dys. level 3) d/t missing dentition; thin liquids. Recommend general aspiration precautions; Pills in Puree for easier, safer swallowing. Assistance at meals to support oral intake as needed.  SLP Visit Diagnosis: Dysphagia, unspecified (R13.10)    Aspiration Risk  (reduced)    Diet Recommendation  Dysphagia level 3(mech soft moistened foods); Thin liquids. General aspiration precautions; assistance during meals as needed  Medication Administration: Whole meds with puree(for easier, safer swallowing)    Other  Recommendations Recommended Consults: (dietician f/u) Oral Care Recommendations: Oral care BID;Staff/trained caregiver to provide oral care Other Recommendations: (n/a)  Follow up Recommendations None      Frequency and Duration (n/a)  (n/a)       Prognosis Prognosis for Safe Diet Advancement: Good      Swallow Study   General Date of Onset:  01/30/18 HPI: Pt is a 72 y.o. female with a known history of congestive heart failure, diabetes mellitus type 2, diverticulitis, hypertension is a resident of [facility].  Patient was found to be confused and lethargic and also had fever of 101.9 F.  She was referred to the emergency room at our hospital she was hypoxic on oxygen by nasal cannula at 4 L with decreased responsiveness.  Patient was febrile and blood pressure was low around 64 / 45 mmHg.  Patient received IV fluid boluses in the emergency room and her lactic acid level was elevated.  She was found to be in septic shock and was started on IV Levophed drip and IV fluids were started based on sepsis protocol.  Code sepsis was called.  Patient was intubated electively and put on ventilator as she was not able to maintain airway and oxygen saturation.  Her blood pressure improved to 100 x 49 mmHg after fluid boluses and IV Levophed drip was started.  Patient was put on IV propofol drip for sedation.  Pt is now extubated and awake. She has eaten bites/sips w/ NSG per report.  Pt was recently admitted on 01/12/2018 for Altered mental status/encephalopathy-secondary to severe hypoglycemia. Unsure of pt's baseline Cognitive status; noted recent Head CT on 01/30/2018 [facility] Type of Study: Bedside Swallow Evaluation Previous Swallow Assessment: none noted Diet Prior to this Study: Regular;Thin liquids Temperature Spikes Noted: (wbc 6.1;  temp 99.1) Respiratory Status: Nasal cannula(3-4 liters) History of Recent Intubation: Yes Length of Intubations (days): 3 days Date extubated: 02/01/18 Behavior/Cognition: Alert;Cooperative;Pleasant mood;Distractible;Requires cueing(unsure of pt's baseline Cognitive status; [facility] Oral Cavity Assessment: Within Functional Limits Oral Care Completed by SLP: Recent completion by staff Oral Cavity - Dentition: Dentures, top(few native lower dentition; missing lower dentition) Vision:  Functional for self-feeding Self-Feeding Abilities: Able to feed self;Needs assist;Needs set up;Total assist Patient Positioning: Upright in bed Baseline Vocal Quality: Low vocal intensity Volitional Cough: Strong Volitional Swallow: Able to elicit    Oral/Motor/Sensory Function Overall Oral Motor/Sensory Function: Within functional limits   Ice Chips Ice chips: Within functional limits Presentation: Spoon(fed; 2 trials)   Thin Liquid Thin Liquid: Within functional limits Presentation: Self Fed;Cup;Straw(mostly via straw - ~6 ozs)    Nectar Thick Nectar Thick Liquid: Not tested   Honey Thick Honey Thick Liquid: Not tested   Puree Puree: Within functional limits Presentation: Spoon;Self Fed(assisted; 5 trials)   Solid   GO   Solid: Impaired Presentation: Spoon(fed; 3 trials) Oral Phase Impairments: Impaired mastication Oral Phase Functional Implications: (lengthy time) Pharyngeal Phase Impairments: (none)        Jerilynn SomKatherine Watson, MS, CCC-SLP Watson,Katherine 02/03/2018,1:18 PM

## 2018-02-03 NOTE — Progress Notes (Signed)
[employer] - [place] at [facility]   PATIENT NAME: Virginia Barnes    MR#:  [id number]  DATE OF BIRTH:  [birth year]  SUBJECTIVE:  CHIEF COMPLAINT:   Chief Complaint  Patient presents with  . Fever  Case discussed with intensivist, to transfer to regular nursing floor  REVIEW OF SYSTEMS:  CONSTITUTIONAL: No fever, fatigue or weakness.  EYES: No blurred or double vision.  EARS, NOSE, AND THROAT: No tinnitus or ear pain.  RESPIRATORY: No cough, shortness of breath, wheezing or hemoptysis.  CARDIOVASCULAR: No chest pain, orthopnea, edema.  GASTROINTESTINAL: No nausea, vomiting, diarrhea or abdominal pain.  GENITOURINARY: No dysuria, hematuria.  ENDOCRINE: No polyuria, nocturia,  HEMATOLOGY: No anemia, easy bruising or bleeding SKIN: No rash or lesion. MUSCULOSKELETAL: No joint pain or arthritis.   NEUROLOGIC: No tingling, numbness, weakness.  PSYCHIATRY: No anxiety or depression.   ROS  DRUG ALLERGIES:   Allergies  Allergen Reactions  . Peanuts [Peanut Oil] Shortness Of Breath  . Lasix [Furosemide] Swelling    Tongue swelling  . Lisinopril Swelling    Tongue swelling    VITALS:  Blood pressure (!) 166/82, pulse 91, temperature 98.6 F (37 C), temperature source Oral, resp. rate (!) 33, height 5\' 9"  (1.753 m), weight 98 kg (216 lb 0.8 oz), SpO2 95 %.  PHYSICAL EXAMINATION:  GENERAL:  72 y.o.-year-old patient lying in the bed with no acute distress.  EYES: Pupils equal, round, reactive to light and accommodation. No scleral icterus. Extraocular muscles intact.  HEENT: Head atraumatic, normocephalic. Oropharynx and nasopharynx clear.  NECK:  Supple, no jugular venous distention. No thyroid enlargement, no tenderness.  LUNGS: Normal breath sounds bilaterally, no wheezing, rales,rhonchi or crepitation. No use of accessory muscles of respiration.  CARDIOVASCULAR: S1, S2 normal. No murmurs, rubs, or gallops.  ABDOMEN: Soft, nontender, nondistended. Bowel sounds  present. No organomegaly or mass.  EXTREMITIES: No pedal edema, cyanosis, or clubbing.  NEUROLOGIC: Cranial nerves II through XII are intact. Muscle strength 5/5 in all extremities. Sensation intact. Gait not checked.  PSYCHIATRIC: The patient is alert and oriented x 3.  SKIN: No obvious rash, lesion, or ulcer.   Physical Exam LABORATORY PANEL:   CBC Recent Labs  Lab 02/01/18 0311  WBC 6.1  HGB 9.3*  HCT 28.7*  PLT 273   ------------------------------------------------------------------------------------------------------------------  Chemistries  Recent Labs  Lab 01/31/18 0441 02/01/18 0311  02/03/18 0511  NA 139 142   < > 142  K 3.4* 3.5   < > 3.6  CL 107 108   < > 103  CO2 24 26   < > 28  GLUCOSE 176* 123*   < > 151*  BUN 16 19   < > 15  CREATININE 1.02* 0.89   < > 0.89  CALCIUM 7.9* 8.5*   < > 8.6*  MG 1.9  --   --   --   AST  --  41  --   --   ALT  --  77*  --   --   ALKPHOS  --  48  --   --   BILITOT  --  0.6  --   --    < > = values in this interval not displayed.   ------------------------------------------------------------------------------------------------------------------  Cardiac Enzymes Recent Labs  Lab 02/01/18 1924 02/02/18 0223  TROPONINI 0.22* 0.20*   ------------------------------------------------------------------------------------------------------------------  RADIOLOGY:  Dg Chest Port 1 View  Result Date: 02/02/2018 CLINICAL DATA:  72 year old female admitted with respiratory failure, fever, hypotension. Acute  pulmonary edema and severe hypertension status post extubation yesterday. EXAM: PORTABLE CHEST 1 VIEW COMPARISON:  02/01/2018 and earlier. FINDINGS: Portable AP semi upright view at 0603 hours. Stable lung volumes. Regressed but not resolved bilateral perihilar and basilar predominant indistinct pulmonary opacity. No pneumothorax. No large pleural effusion. There is confluent retrocardiac opacity. Stable cardiac size and  mediastinal contours. Calcified aortic atherosclerosis. No acute osseous abnormality identified. IMPRESSION: 1. Regressed bilateral perihilar and basilar interstitial opacity since yesterday compatible with regression of pulmonary edema. 2. Left lower lobe collapse or consolidation. Electronically Signed   By: Odessa Fleming M.D.   On: 02/02/2018 07:34   Dg Chest Port 1 View  Result Date: 02/01/2018 CLINICAL DATA:  Hypoxia, on BiPAP EXAM: PORTABLE CHEST 1 VIEW COMPARISON:  02/01/2018 at 0410 hours FINDINGS: Progressive interstitial/perihilar opacities, compatible with mild to moderate interstitial edema, increased. Suspected small left pleural effusion. Retrocardiac region is poorly evaluated. Interval extubation and removal of enteric tube. Cardiomegaly. IMPRESSION: Interval extubation and removal of enteric tube. Cardiomegaly with suspected mild to moderate interstitial edema, increased. Small left pleural effusion. Electronically Signed   By: Charline Bills M.D.   On: 02/01/2018 16:20    ASSESSMENT AND PLAN:  72 year old female with past medical history of diabetes, CHF, hypertension, previous history of diverticulitis who presents to the hospital due to fever, hypotension, altered mental status.  1.Acute sepsis  Resolved  Weaned off vasopressors, continue sepsis protocol, empiric antibiotics, discussion with intensivist-okay to transfer to regular nursing floor  2. Acute respiratory failure with hypoxia secondary to pneumonia Resolving Status post extubation February 02, 2018 Continue supplemental oxygen with weaning as tolerated  3.Acute HCAP Resolving Continue to pneumonia protocol, empiric antibiotics and follow-up on cultures  4. Diabetes type 2 Controlled on current regiment  5. Essential hypertension Stable on current regiment  6. GERD Stable PPI daily  7.  Acute Norovirus enteritis Continue supportive care  All the records are reviewed and case discussed with  Care Management/Social Workerr. Management plans discussed with the patient, family and they are in agreement.  CODE STATUS: full  TOTAL TIME TAKING CARE OF THIS PATIENT: 35 minutes.     POSSIBLE D/C IN 1-3 DAYS, DEPENDING ON CLINICAL CONDITION.   Evelena Asa Julisa Flippo M.D on 02/03/2018   Between 7am to 6pm - Pager - [phone]  After 6pm go to [email or web address] - password [email or web address]  [facility] [place] [facility]  Office  [phone]  CC: Primary care physician; Margaretann Loveless, MD  Note: This dictation was prepared with Dragon dictation along with smaller phrase technology. Any transcriptional errors that result from this process are unintentional.

## 2018-02-03 NOTE — Progress Notes (Signed)
Report called to Josh, RN on 2c, pt placed on telemetry and 3l [place] oxygen, no ss of distress noted before transfer

## 2018-02-04 ENCOUNTER — Inpatient Hospital Stay (HOSPITAL_COMMUNITY)
Admit: 2018-02-04 | Discharge: 2018-02-04 | Disposition: A | Discharge status: Home / Self Care | Payer: Medicare HMO | Attending: Family Medicine | Admitting: Family Medicine

## 2018-02-04 DIAGNOSIS — I5031 Acute diastolic (congestive) heart failure: Secondary | ICD-10-CM

## 2018-02-04 LAB — CULTURE, BLOOD (ROUTINE X 2)
CULTURE: NO GROWTH
Culture: NO GROWTH
Special Requests: ADEQUATE

## 2018-02-04 LAB — GLUCOSE, CAPILLARY
GLUCOSE-CAPILLARY: 135 mg/dL — AB (ref 65–99)
Glucose-Capillary: 123 mg/dL — ABNORMAL HIGH (ref 65–99)
Glucose-Capillary: 125 mg/dL — ABNORMAL HIGH (ref 65–99)
Glucose-Capillary: 148 mg/dL — ABNORMAL HIGH (ref 65–99)

## 2018-02-04 MED ORDER — HYDRALAZINE HCL 20 MG/ML IJ SOLN: 10.0000 mg | INTRAMUSCULAR | Status: DC | PRN

## 2018-02-04 MED ORDER — FUROSEMIDE 10 MG/ML IJ SOLN
40.0000 mg | Freq: Two times a day (BID) | INTRAMUSCULAR | Status: DC
  Administered 2018-02-04 – 2018-02-06 (×4): 40 mg via INTRAVENOUS
  Filled 2018-02-04 (×5): qty 4

## 2018-02-04 MED ORDER — CARVEDILOL 6.25 MG PO TABS
6.2500 mg | ORAL_TABLET | Freq: Two times a day (BID) | ORAL | Status: DC
  Administered 2018-02-04 – 2018-02-06 (×5): 6.25 mg via ORAL
  Filled 2018-02-04 (×5): qty 1

## 2018-02-04 MED ORDER — HYDRALAZINE HCL 25 MG PO TABS
25.0000 mg | ORAL_TABLET | Freq: Three times a day (TID) | ORAL | Status: DC
  Administered 2018-02-04 – 2018-02-06 (×4): 25 mg via ORAL
  Filled 2018-02-04 (×6): qty 1

## 2018-02-04 MED ORDER — LACTULOSE 10 GM/15ML PO SOLN
30.0000 g | Freq: Two times a day (BID) | ORAL | Status: DC
  Administered 2018-02-04 – 2018-02-06 (×2): 30 g via ORAL
  Filled 2018-02-04 (×5): qty 60

## 2018-02-04 NOTE — Progress Notes (Addendum)
[employer] - [place] at [facility]   PATIENT NAME: Virginia Barnes    MR#:  [id number]  DATE OF BIRTH:  [birth year]  SUBJECTIVE:  CHIEF COMPLAINT:   Chief Complaint  Patient presents with  . Fever  Complains of constipation, weakness, clinical evidence for heart failure exacerbation Rales on auscultation of lungs Marked bilateral lower extremity pitting edema, start IV Lasix, check echocardiogram, start Coreg  REVIEW OF SYSTEMS:  CONSTITUTIONAL: No fever, fatigue or weakness.  EYES: No blurred or double vision.  EARS, NOSE, AND THROAT: No tinnitus or ear pain.  RESPIRATORY: No cough, shortness of breath, wheezing or hemoptysis.  CARDIOVASCULAR: No chest pain, orthopnea, edema.  GASTROINTESTINAL: No nausea, vomiting, diarrhea or abdominal pain.  GENITOURINARY: No dysuria, hematuria.  ENDOCRINE: No polyuria, nocturia,  HEMATOLOGY: No anemia, easy bruising or bleeding SKIN: No rash or lesion. MUSCULOSKELETAL: No joint pain or arthritis.   NEUROLOGIC: No tingling, numbness, weakness.  PSYCHIATRY: No anxiety or depression.   ROS  DRUG ALLERGIES:   Allergies  Allergen Reactions  . Peanuts [Peanut Oil] Shortness Of Breath  . Lasix [Furosemide] Swelling    Tongue swelling  . Lisinopril Swelling    Tongue swelling    VITALS:  Blood pressure (!) 124/55, pulse 75, temperature 97.8 F (36.6 C), temperature source Oral, resp. rate 17, height 5\' 9"  (1.753 m), weight 98 kg (216 lb 0.8 oz), SpO2 99 %.  PHYSICAL EXAMINATION:  GENERAL:  73 y.o.-year-old patient lying in the bed with no acute distress.  EYES: Pupils equal, round, reactive to light and accommodation. No scleral icterus. Extraocular muscles intact.  HEENT: Head atraumatic, normocephalic. Oropharynx and nasopharynx clear.  NECK:  Supple, no jugular venous distention. No thyroid enlargement, no tenderness.  LUNGS: Normal breath sounds bilaterally, no wheezing, rales,rhonchi or crepitation. No use of accessory  muscles of respiration.  CARDIOVASCULAR: S1, S2 normal. No murmurs, rubs, or gallops.  ABDOMEN: Soft, nontender, nondistended. Bowel sounds present. No organomegaly or mass.  EXTREMITIES: No pedal edema, cyanosis, or clubbing.  NEUROLOGIC: Cranial nerves II through XII are intact. Muscle strength 5/5 in all extremities. Sensation intact. Gait not checked.  PSYCHIATRIC: The patient is alert and oriented x 3.  SKIN: No obvious rash, lesion, or ulcer.   Physical Exam LABORATORY PANEL:   CBC Recent Labs  Lab 02/01/18 0311  WBC 6.1  HGB 9.3*  HCT 28.7*  PLT 273   ------------------------------------------------------------------------------------------------------------------  Chemistries  Recent Labs  Lab 01/31/18 0441 02/01/18 0311  02/03/18 0511  NA 139 142   < > 142  K 3.4* 3.5   < > 3.6  CL 107 108   < > 103  CO2 24 26   < > 28  GLUCOSE 176* 123*   < > 151*  BUN 16 19   < > 15  CREATININE 1.02* 0.89   < > 0.89  CALCIUM 7.9* 8.5*   < > 8.6*  MG 1.9  --   --   --   AST  --  41  --   --   ALT  --  77*  --   --   ALKPHOS  --  48  --   --   BILITOT  --  0.6  --   --    < > = values in this interval not displayed.   ------------------------------------------------------------------------------------------------------------------  Cardiac Enzymes Recent Labs  Lab 02/01/18 1924 02/02/18 0223  TROPONINI 0.22* 0.20*   ------------------------------------------------------------------------------------------------------------------  RADIOLOGY:  No results found.  ASSESSMENT AND PLAN:  72 year old female with past medical history of diabetes, CHF, hypert18ension, previous history of diverticulitis who presents to the hospital due to fever, hypotension, altered mental status.  1.Acute sepsis  Resolved  Weaned off vasopressors, did require short ICU stay, treated on our sepsis protocol, continue empiric Zosyn for 5-day course  2. Acute respiratory failure with  hypoxia secondary to multifactorial process which includes sepsis, pneumonia, and congestive heart failure Status post extubation February 02, 2018 Continue supplemental oxygen with weaning as tolerated  3.Acute HCAP Resolving Continue to pneumonia protocol, empiric Zosyn for 5 days, follow-up on outstanding cultures   4. Acute congestive heart failure exacerbation  Exacerbated by above Unknown etiology  Check echocardiogram, Coreg twice daily, IV Lasix, strict I&O monitoring, daily weights, aspirin, allergy to lisinopril noted-avoid ACE/ARBs  5. Essential hypertension Stable on current regiment  6. GERD Stable PPI daily  7.History of Norovirus Resolved  All the records are reviewed and case discussed with Care Management/Social Workerr. Management plans discussed with the patient, family and they are in agreement.  CODE STATUS: full  TOTAL TIME TAKING CARE OF THIS PATIENT: 35 minutes.     POSSIBLE D/C IN 1-3 DAYS, DEPENDING ON CLINICAL CONDITION.   Evelena AsaMontell D Chastelyn Athens M.D on 02/04/2018   Between 7am to 6pm - Pager - [phone]  After 6pm go to [email or web address] - password [email or web address]  [facility] [place] [facility]  Office  [phone]  CC: Primary care physician; Margaretann LovelessKhan, Neelam S, MD  Note: This dictation was prepared with Dragon dictation along with smaller phrase technology. Any transcriptional errors that result from this process are unintentional.

## 2018-02-04 NOTE — Progress Notes (Signed)
Per dr. Katheren ShamsSalary ok to remove groin central line so pt is able to ambulate and work with PT. Line removed with no signs of bleeding

## 2018-02-05 LAB — ECHOCARDIOGRAM COMPLETE
Height: 69 in
Weight: 3456.81 oz

## 2018-02-05 LAB — BASIC METABOLIC PANEL
Anion gap: 9 (ref 5–15)
BUN: 17 mg/dL (ref 6–20)
CHLORIDE: 99 mmol/L — AB (ref 101–111)
CO2: 31 mmol/L (ref 22–32)
Calcium: 8.7 mg/dL — ABNORMAL LOW (ref 8.9–10.3)
Creatinine, Ser: 0.87 mg/dL (ref 0.44–1.00)
GFR calc Af Amer: >60 mL/min (ref >60)
GFR calc non Af Amer: >60 mL/min (ref >60)
Glucose, Bld: 117 mg/dL — ABNORMAL HIGH (ref 65–99)
POTASSIUM: 3.7 mmol/L (ref 3.5–5.1)
SODIUM: 139 mmol/L (ref 135–145)

## 2018-02-05 LAB — CBC WITH DIFFERENTIAL/PLATELET
Basophils Absolute: 0 10*3/uL (ref 0–0.1)
Basophils Relative: 1 %
EOS PCT: 4 %
Eosinophils Absolute: 0.3 10*3/uL (ref 0–0.7)
HCT: 31.4 % — ABNORMAL LOW (ref 35.0–47.0)
HEMOGLOBIN: 10 g/dL — AB (ref 12.0–16.0)
LYMPHS ABS: 0.8 10*3/uL — AB (ref 1.0–3.6)
LYMPHS PCT: 12 %
MCH: 27.3 pg (ref 26.0–34.0)
MCHC: 31.9 g/dL — ABNORMAL LOW (ref 32.0–36.0)
MCV: 85.6 fL (ref 80.0–100.0)
Monocytes Absolute: 0.6 10*3/uL (ref 0.2–0.9)
Monocytes Relative: 10 %
NEUTROS PCT: 73 %
Neutro Abs: 4.9 10*3/uL (ref 1.4–6.5)
Platelets: 349 10*3/uL (ref 150–440)
RBC: 3.67 MIL/uL — AB (ref 3.80–5.20)
RDW: 15.1 % — ABNORMAL HIGH (ref 11.5–14.5)
WBC: 6.7 10*3/uL (ref 3.6–11.0)

## 2018-02-05 LAB — GLUCOSE, CAPILLARY
GLUCOSE-CAPILLARY: 137 mg/dL — AB (ref 65–99)
GLUCOSE-CAPILLARY: 129 mg/dL — AB (ref 65–99)
Glucose-Capillary: 114 mg/dL — ABNORMAL HIGH (ref 65–99)
Glucose-Capillary: 162 mg/dL — ABNORMAL HIGH (ref 65–99)

## 2018-02-05 NOTE — Progress Notes (Signed)
Physical Therapy Treatment Patient Details Name: Virginia Barnes MRN: [id number] DOB: [birth year] Today's Date: 02/05/2018    History of Present Illness Virginia Barnes  is a 72 y.o. female with a known history of congestive heart failure, diabetes mellitus type 2, diverticulitis, hypertension is a resident of [facility] facility.  Patient was found to be confused and lethargic and also had fever of 101.9 F.  She was referred to the emergency room at our hospital she was hypoxic on oxygen by nasal cannula at 4 L with decreased responsiveness.  Patient was febrile and blood pressure was low around 64 / 45 mmHg.  Patient received IV fluid boluses in the emergency room and her lactic acid level was elevated.  She was found to be in septic shock and was started on IV Levophed drip and IV fluids were started based on sepsis protocol.  Code sepsis was called.  Patient was intubated electively and put on ventilator as she was not able to maintain airway and oxygen saturation.  Her blood pressure improved to 100/49 mmHg after fluid boluses and IV Levophed drip was started.  Patient was put on IV propofol drip for sedation.  Hospitalist service was consulted.  CT head without contrast was also ordered in the emergency room without any acute findings. Pt is now admitted for acute sepsis, acute respiratory failure with hypoxia, acute HCAP, and acute norovirus enteritis.    PT Comments    Pt is up today in bed with declined trip to side of bed due to fatigue. Her plan is to follow up in SNF and is on room air to see how this goes.  Pt is dropping O2 sats with effort on room air to 88% and noted to nursing.  Her family is in and able to observe her visit, and supportive of pt working.  Follow acutely to progress strengthening and standing as pt can tolerate, dc to SNF when Ready.   Follow Up Recommendations  SNF     Equipment Recommendations  Rolling walker with 5" wheels    Recommendations for Other Services        Precautions / Restrictions Precautions Precautions: Fall Restrictions Weight Bearing Restrictions: No    Mobility  Bed Mobility Overal bed mobility: Needs Assistance Bed Mobility: Rolling Rolling: Mod assist         General bed mobility comments: reminders for all steps of movement  Transfers                 General transfer comment: declined  Ambulation/Gait             General Gait Details: not able to attempt   Stairs            Wheelchair Mobility    Modified Rankin (Stroke Patients Only)       Balance Overall balance assessment: Needs assistance                                          Cognition Arousal/Alertness: Awake/alert Behavior During Therapy: WFL for tasks assessed/performed Overall Cognitive Status: Within Functional Limits for tasks assessed                                        Exercises General Exercises - Lower Extremity Ankle Circles/Pumps: AAROM;Both;5 reps  Quad Sets: AROM;Both;10 reps Gluteal Sets: AROM;Both;10 reps Heel Slides: AROM;Both;10 reps Hip ABduction/ADduction: AAROM;Both;20 reps Straight Leg Raises: AAROM;Both;10 reps    General Comments        Pertinent Vitals/Pain Pain Assessment: No/denies pain    Home Living                      Prior Function            PT Goals (current goals can now be found in the care plan section) Acute Rehab PT Goals Patient Stated Goal: Return to prior function at ALF Progress towards PT goals: Progressing toward goals    Frequency    Min 2X/week      PT Plan Current plan remains appropriate    Co-evaluation              AM-PAC PT "6 Clicks" Daily Activity  Outcome Measure  Difficulty turning over in bed (including adjusting bedclothes, sheets and blankets)?: Unable Difficulty moving from lying on back to sitting on the side of the bed? : Unable Difficulty sitting down on and standing up from a  chair with arms (e.g., wheelchair, bedside commode, etc,.)?: Unable Help needed moving to and from a bed to chair (including a wheelchair)?: A Lot Help needed walking in hospital room?: Total Help needed climbing 3-5 steps with a railing? : Total 6 Click Score: 7    End of Session Equipment Utilized During Treatment: Other (comment)(O2 removed to determine tolerance for activity on room air) Activity Tolerance: Treatment limited secondary to medical complications (Comment)(OP2 sats dropped to 88% wiht exercises on bed) Patient left: in bed;with call bell/phone within reach;with bed alarm set;with family/visitor present Nurse Communication: Mobility status PT Visit Diagnosis: Unsteadiness on feet (R26.81);Muscle weakness (generalized) (M62.81);Difficulty in walking, not elsewhere classified (R26.2)     Time: [id number] PT Time Calculation (min) (ACUTE ONLY): 23 min  Charges:  $Therapeutic Exercise: 8-22 mins $Therapeutic Activity: 8-22 mins                    G Codes:  Functional Assessment Tool Used: AM-PAC 6 Clicks Basic Mobility    Ivar DrapeRuth E Lavern Maslow 02/05/2018, 4:12 PM   Samul Dadauth Hasson Gaspard, PT MS Acute Rehab Dept. Number: [facility] [phone] and [facility] [phone]

## 2018-02-05 NOTE — Clinical Social Work Note (Addendum)
CSW contacted PT to request patient be seen today so that their assessment can be sent to patient's insurance for review. Patient had a bed offer from 3 facilities: [facility], [facility] and [facility]. Patient has chosen [facility] however insurance will have to authorize this. CSW will await PT assessment now that patient has been transferred to floor care. Patient stated she would update her son regarding discharge plans. York SpanielMonica Staley Lunz MSW,LCSW [phone]

## 2018-02-05 NOTE — Progress Notes (Signed)
[facility] [place] [facility]   PATIENT NAME: Virginia Barnes    MR#:  [id number]  DATE OF BIRTH:  08-Aug-1946  SUBJECTIVE:  CHIEF COMPLAINT:   Chief Complaint  Patient presents with  . Fever  Patient feeling slightly better today, continue to wean O2 as tolerated, patient still requiring maximal assistance with ambulation, most likely will require higher level of care-inpatient rehab/SNF  REVIEW OF SYSTEMS:  CONSTITUTIONAL: No fever, fatigue or weakness.  EYES: No blurred or double vision.  EARS, NOSE, AND THROAT: No tinnitus or ear pain.  RESPIRATORY: No cough, shortness of breath, wheezing or hemoptysis.  CARDIOVASCULAR: No chest pain, orthopnea, edema.  GASTROINTESTINAL: No nausea, vomiting, diarrhea or abdominal pain.  GENITOURINARY: No dysuria, hematuria.  ENDOCRINE: No polyuria, nocturia,  HEMATOLOGY: No anemia, easy bruising or bleeding SKIN: No rash or lesion. MUSCULOSKELETAL: No joint pain or arthritis.   NEUROLOGIC: No tingling, numbness, weakness.  PSYCHIATRY: No anxiety or depression.   ROS  DRUG ALLERGIES:   Allergies  Allergen Reactions  . Peanuts [Peanut Oil] Shortness Of Breath  . Lasix [Furosemide] Swelling    Tongue swelling  . Lisinopril Swelling    Tongue swelling    VITALS:  Blood pressure (!) 131/42, pulse 68, temperature (!) 97.5 F (36.4 C), temperature source Oral, resp. rate 20, height 5\' 9"  (1.753 m), weight 98 kg (216 lb 0.8 oz), SpO2 98 %.  PHYSICAL EXAMINATION:  GENERAL:  72 y.o.-year-old patient lying in the bed with no acute distress.  EYES: Pupils equal, round, reactive to light and accommodation. No scleral icterus. Extraocular muscles intact.  HEENT: Head atraumatic, normocephalic. Oropharynx and nasopharynx clear.  NECK:  Supple, no jugular venous distention. No thyroid enlargement, no tenderness.  LUNGS: Normal breath sounds bilaterally, no wheezing, rales,rhonchi or crepitation. No use of accessory muscles of  respiration.  CARDIOVASCULAR: S1, S2 normal. No murmurs, rubs, or gallops.  ABDOMEN: Soft, nontender, nondistended. Bowel sounds present. No organomegaly or mass.  EXTREMITIES: No pedal edema, cyanosis, or clubbing.  NEUROLOGIC: Cranial nerves II through XII are intact. Muscle strength 5/5 in all extremities. Sensation intact. Gait not checked.  PSYCHIATRIC: The patient is alert and oriented x 3.  SKIN: No obvious rash, lesion, or ulcer.   Physical Exam LABORATORY PANEL:   CBC Recent Labs  Lab 02/05/18 0518  WBC 6.7  HGB 10.0*  HCT 31.4*  PLT 349   ------------------------------------------------------------------------------------------------------------------  Chemistries  Recent Labs  Lab 01/31/18 0441 02/01/18 0311  02/05/18 0518  NA 139 142   < > 139  K 3.4* 3.5   < > 3.7  CL 107 108   < > 99*  CO2 24 26   < > 31  GLUCOSE 176* 123*   < > 117*  BUN 16 19   < > 17  CREATININE 1.02* 0.89   < > 0.87  CALCIUM 7.9* 8.5*   < > 8.7*  MG 1.9  --   --   --   AST  --  41  --   --   ALT  --  77*  --   --   ALKPHOS  --  48  --   --   BILITOT  --  0.6  --   --    < > = values in this interval not displayed.   ------------------------------------------------------------------------------------------------------------------  Cardiac Enzymes Recent Labs  Lab 02/01/18 1924 02/02/18 0223  TROPONINI 0.22* 0.20*   ------------------------------------------------------------------------------------------------------------------  RADIOLOGY:  No results found.  ASSESSMENT AND PLAN:  72 year old female with past medical history of diabetes, CHF, hypertension, previous history of diverticulitis who presents to the hospital due to fever, hypotension, altered mental status.  1.Acute sepsis  Resolved  Weaned off vasopressors, did require short ICU stay, treated on our sepsis protocol, completed 6-day course of IV Zosyn   2. Acute respiratory failure with  hypoxia Resolving secondary to multifactorial process which includes sepsis, pneumonia, and congestive heart failure S/p extubation February 02, 2018 Continue supplemental oxygen with weaning as tolerated  3.Acute HCAP Resolved Continue to pneumonia protocol, completed 6 day course of Zosyn   4. Acute diastolic congestive heart failure exacerbation  Resolving Exacerbated by above Echocardiogram noted for stage II diastolic dysfunction continue Coreg twice daily, IV Lasix, aspirin, allergy to lisinopril noted-avoid ACE/ARBs  5. Essential hypertension Stable on current regiment  6. GERD Stable PPI daily  7.History of Norovirus Resolved   Disposition to higher level of care-skilled nursing facility/inpatient rehab on tomorrow barring any complications  All the records are reviewed and case discussed with Care Management/Social Workerr. Management plans discussed with the patient, family and they are in agreement.  CODE STATUS: full  TOTAL TIME TAKING CARE OF THIS PATIENT: 35 minutes.     POSSIBLE D/C IN 1-2 DAYS, DEPENDING ON CLINICAL CONDITION.   Evelena Asa Niharika Savino M.D on 02/05/2018   Between 7am to 6pm - Pager - [phone]  After 6pm go to [email or web address] - password [email or web address]  [facility]  Office  [phone]  CC: Primary care physician; Margaretann Loveless, MD  Note: This dictation was prepared with Dragon dictation along with smaller phrase technology. Any transcriptional errors that result from this process are unintentional.

## 2018-02-05 NOTE — [place] FL2 (Signed)
[place] MEDICAID FL2 LEVEL OF CARE SCREENING TOOL     IDENTIFICATION  Patient Name: Virginia Barnes Birthdate: [birth year] Sex: female Admission Date (Current Location): 01/30/2018  [place] and [place] Number:  Chiropodist and Address:  [facility], [address] [place], [place] [address]      Provider Number: [id number]  Attending Physician Name and Address:  Bertrum Sol, MD  Relative Name and Phone Number:  Ailie Gage (son) [phone]    Current Level of Care: Hospital Recommended Level of Care: Skilled Nursing Facility Prior Approval Number:    Date Approved/Denied:   PASRR Number:    Discharge Plan: SNF    Current Diagnoses: Patient Active Problem List   Diagnosis Date Noted  . Sepsis (HCC) 01/30/2018  . Hypoglycemia 01/12/2018  . Knee pain 05/17/2017    Orientation RESPIRATION BLADDER Height & Weight     Self, Time, Situation, Place  Normal, O2(2) Incontinent Weight: 216 lb 0.8 oz (98 kg) Height:  5\' 9"  (175.3 cm)  BEHAVIORAL SYMPTOMS/MOOD NEUROLOGICAL BOWEL NUTRITION STATUS  (none) (none) Incontinent Diet  AMBULATORY STATUS COMMUNICATION OF NEEDS Skin   Extensive Assist Verbally Normal                       Personal Care Assistance Level of Assistance  Dressing, Bathing Bathing Assistance: Limited assistance Feeding assistance: Limited assistance Dressing Assistance: Limited assistance     Functional Limitations Info  (none) Sight Info: Adequate Hearing Info: Adequate Speech Info: Adequate    SPECIAL CARE FACTORS FREQUENCY  PT (By licensed PT)                    Contractures      Additional Factors Info  Allergies Code Status Info: full Allergies Info: peanuts; lasix; lisinopril           Current Medications (02/05/2018):  This is the current hospital active medication list Current Facility-Administered Medications  Medication Dose Route Frequency Provider Last Rate Last Dose   . acetaminophen (TYLENOL) tablet 650 mg  650 mg Oral Q6H PRN Merwyn Katos, MD       Or  . acetaminophen (TYLENOL) suppository 650 mg  650 mg Rectal Q6H PRN Merwyn Katos, MD      . aspirin EC tablet 81 mg  81 mg Oral Daily Marylou Flesher S, NP   81 mg at 02/05/18 1027  . calcium carbonate (TUMS - dosed in mg elemental calcium) chewable tablet 500 mg of elemental calcium  2.5 tablet Oral BID Salary, Montell D, MD   500 mg of elemental calcium at 02/05/18 1025  . carvedilol (COREG) tablet 6.25 mg  6.25 mg Oral BID WC Salary, Montell D, MD   6.25 mg at 02/05/18 0823  . enoxaparin (LOVENOX) injection 40 mg  40 mg Subcutaneous Q24H Pyreddy, Vivien Rota, MD   40 mg at 02/04/18 2240  . ferrous sulfate tablet 325 mg  325 mg Oral Daily Marylou Flesher S, NP   325 mg at 02/05/18 1026  . fluticasone (FLONASE) 50 MCG/ACT nasal spray 2 spray  2 spray Each Nare Daily Marylou Flesher S, NP   2 spray at 02/05/18 1026  . furosemide (LASIX) injection 40 mg  40 mg Intravenous BID Angelina Ok D, MD   40 mg at 02/05/18 5621  . hydrALAZINE (APRESOLINE) injection 10 mg  10 mg Intravenous Q4H PRN Salary, Montell D, MD      . hydrALAZINE (APRESOLINE)  tablet 25 mg  25 mg Oral Q8H Salary, Montell D, MD   25 mg at 02/04/18 2239  . insulin aspart (novoLOG) injection 0-15 Units  0-15 Units Subcutaneous TID WC Lewie Loronukov, Magadalene S, NP   2 Units at 02/04/18 1730  . insulin aspart (novoLOG) injection 0-5 Units  0-5 Units Subcutaneous QHS Tukov, Magadalene S, NP      . lactulose (CHRONULAC) 10 GM/15ML solution 30 g  30 g Oral BID Salary, Montell D, MD   30 g at 02/04/18 1331  . multivitamin with minerals tablet 1 tablet  1 tablet Oral Daily Salary, Montell D, MD   1 tablet at 02/05/18 1025  . ondansetron (ZOFRAN) injection 4 mg  4 mg Intravenous Q6H PRN Pyreddy, Pavan, MD      . protein supplement (PREMIER PROTEIN) liquid  11 oz Oral BID BM Salary, Montell D, MD   11 oz at 02/04/18 0953  . rosuvastatin (CRESTOR) tablet  20 mg  20 mg Oral Daily Tukov, Magadalene S, NP   20 mg at 02/05/18 1025  . sertraline (ZOLOFT) tablet 100 mg  100 mg Oral Daily Tukov, Magadalene S, NP   100 mg at 02/05/18 1025  . sodium chloride flush (NS) 0.9 % injection 10-40 mL  10-40 mL Intracatheter PRN Erin FullingKasa, Kurian, MD         Discharge Medications: Please see discharge summary for a list of discharge medications.  Relevant Imaging Results:  Relevant Lab Results:   Additional Information ss: [id number]  York SpanielMonica Julane Crock, LCSW

## 2018-02-05 NOTE — Plan of Care (Signed)
Pt is progressing. Weaned to room air. Plan to d/c to rehab facility tomorrow

## 2018-02-06 LAB — GLUCOSE, CAPILLARY
GLUCOSE-CAPILLARY: 219 mg/dL — AB (ref 65–99)
Glucose-Capillary: 119 mg/dL — ABNORMAL HIGH (ref 65–99)

## 2018-02-06 LAB — CREATININE, SERUM
Creatinine, Ser: 0.93 mg/dL (ref 0.44–1.00)
GFR, EST NON AFRICAN AMERICAN: 60 mL/min — AB (ref >60)

## 2018-02-06 MED ORDER — PREMIER PROTEIN SHAKE: 11.0000 [oz_av] | Freq: Two times a day (BID) | ORAL | 0 refills | Status: DC

## 2018-02-06 MED ORDER — FUROSEMIDE 40 MG PO TABS: 40.0000 mg | ORAL_TABLET | Freq: Every day | ORAL | 1 refills | Status: DC

## 2018-02-06 MED ORDER — TRAMADOL HCL 50 MG PO TABS: 50.0000 mg | ORAL_TABLET | Freq: Two times a day (BID) | ORAL | 0 refills | Status: DC | PRN

## 2018-02-06 NOTE — Clinical Social Work Note (Signed)
Patient discharging to [facility] today. Discharge information sent to [facility]. Nurse to call report. Patient states she has notified her son and wishes to transport via EMS. York SpanielMonica Lexton Hidalgo MSW,LCSW [phone]

## 2018-02-06 NOTE — Progress Notes (Signed)
Report called to Juliette AlcideMelinda, Charity fundraiserN, at [facility].  Awaiting EMS transport.

## 2018-02-06 NOTE — Clinical Social Work Note (Signed)
Berkley Harveyuth has been received by Minerva AreolaEric at [facility]. Patient has been approved to go today to [facility]. York SpanielMonica Natividad Halls MSW,LCSW [phone]

## 2018-02-06 NOTE — Discharge Summary (Signed)
[facility] [place] [facility]   PATIENT NAME: Virginia Barnes    MR#:  [id number]  DATE OF BIRTH:  November 21, 1946  DATE OF ADMISSION:  01/30/2018 ADMITTING PHYSICIAN: Auburn Bilberry, MD  DATE OF DISCHARGE: No discharge date for patient encounter.  PRIMARY CARE PHYSICIAN: Margaretann Loveless, MD    ADMISSION DIAGNOSIS:  Septic shock (HCC) [A41.9, R65.21] Severe sepsis (HCC) [A41.9, R65.20] Acute respiratory failure, unspecified whether with hypoxia or hypercapnia (HCC) [J96.00]  DISCHARGE DIAGNOSIS:  Active Problems:   Sepsis (HCC)   SECONDARY DIAGNOSIS:   Past Medical History:  Diagnosis Date  . CHF (congestive heart failure) (HCC)   . Diabetes mellitus without complication (HCC)   . Diverticulitis   . Hypertension     HOSPITAL COURSE:  72 year old female with past medical history of diabetes, CHF, hypertension, previous history of diverticulitis who presents to the hospital due to fever, hypotension, altered mental status.  1.Acute sepsis  Resolved  Weaned off vasopressors,did require short ICU stay, treated on our sepsis protocol, completed 6-day course of IV Zosyn   2. Acute respiratory failure with hypoxia Resolved secondary tomultifactorial process which includes sepsis, pneumonia, and congestive heart failure S/p extubation February 02, 2018 Successfully weaned off oxygen prior to discharge   3.Acute HCAP Resolved Continue to pneumonia protocol, completed 6 day course of Zosyn  4. Acute diastolic congestive heart failure exacerbation  Resolved Exacerbated by above Echocardiogram noted for stage II diastolic dysfunction treated with beta-blocker therapy, IV Lasix, aspirin,allergy to lisinopril noted-avoid ACE/ARBs  5. Essential hypertension Stable on current regiment  6. GERD Stable PPI daily  7.History ofNorovirus Resolved  DISCHARGE CONDITIONS:  On the day of discharge patient is afebrile, hemogram  stable, tolerating diet, ready for discharge to skilled nursing facility, for more specific details please see chart   CONSULTS OBTAINED:  Treatment Team:  Traver Meckes, Evelena Asa, MD  DRUG ALLERGIES:   Allergies  Allergen Reactions  . Peanuts [Peanut Oil] Shortness Of Breath  . Lasix [Furosemide] Swelling    Tongue swelling  . Lisinopril Swelling    Tongue swelling    DISCHARGE MEDICATIONS:   Allergies as of 02/06/2018      Reactions   Peanuts [peanut Oil] Shortness Of Breath   Lasix [furosemide] Swelling   Tongue swelling   Lisinopril Swelling   Tongue swelling      Medication List    STOP taking these medications   hydrALAZINE 25 MG tablet Commonly known as:  APRESOLINE   hydrochlorothiazide 25 MG tablet Commonly known as:  HYDRODIURIL     TAKE these medications   ACCU-CHEK AVIVA PLUS test strip Generic drug:  glucose blood   ACCU-CHEK AVIVA Soln   ASPIRIN LOW DOSE 81 MG EC tablet Generic drug:  aspirin Take 1 tablet by mouth daily.   ASSURE COMFORT LANCETS 30G Misc   CALCIUM 600/VITAMIN D 600-400 MG-UNIT Tabs Generic drug:  Calcium Carbonate-Vitamin D3 Take 1 tablet by mouth 2 (two) times daily.   cetirizine 10 MG tablet Commonly known as:  ZYRTEC Take 10 mg by mouth every evening.   clotrimazole 1 % cream Commonly known as:  LOTRIMIN Apply 1 application topically 2 (two) times daily.   ferrous sulfate 325 (65 FE) MG EC tablet Take 325 mg by mouth daily.   fluticasone 50 MCG/ACT nasal spray Commonly known as:  FLONASE Place 2 sprays into both nostrils daily.   furosemide 40 MG tablet Commonly known as:  LASIX Take 1 tablet (40 mg total)  by mouth daily.   insulin aspart 100 UNIT/ML injection Commonly known as:  NOVOLOG 1-150 - no coverage.  151-200 - 2 units 201-250 4 units 251-300 - 6 units 301-350 - 8 units 351-400 - 10 units and call MD. What changed:    how much to take  how to take this  when to take this  additional  instructions   ipratropium 0.03 % nasal spray Commonly known as:  ATROVENT Place 2 sprays into both nostrils 3 (three) times daily as needed for rhinitis.   omeprazole 40 MG capsule Commonly known as:  PRILOSEC Take 1 capsule by mouth daily.   potassium chloride 10 MEQ tablet Commonly known as:  K-DUR Take 10 mEq by mouth daily.   protein supplement shake Liqd Commonly known as:  PREMIER PROTEIN Take 325 mLs (11 oz total) by mouth 2 (two) times daily between meals.   rosuvastatin 20 MG tablet Commonly known as:  CRESTOR Take 20 mg by mouth daily.   sertraline 100 MG tablet Commonly known as:  ZOLOFT Take 100 mg by mouth daily.   sotalol 80 MG tablet Commonly known as:  BETAPACE 80 mg 2 (two) times daily.   traMADol 50 MG tablet Commonly known as:  ULTRAM Take 1 tablet (50 mg total) by mouth every 6 (six) hours as needed. What changed:  when to take this        DISCHARGE INSTRUCTIONS:  If you experience worsening of your admission symptoms, develop shortness of breath, life threatening emergency, suicidal or homicidal thoughts you must seek medical attention immediately by calling 911 or calling your MD immediately  if symptoms less severe.  You Must read complete instructions/literature along with all the possible adverse reactions/side effects for all the Medicines you take and that have been prescribed to you. Take any new Medicines after you have completely understood and accept all the possible adverse reactions/side effects.   Please note  You were cared for by a hospitalist during your hospital stay. If you have any questions about your discharge medications or the care you received while you were in the hospital after you are discharged, you can call the unit and asked to speak with the hospitalist on call if the hospitalist that took care of you is not available. Once you are discharged, your primary care physician will handle any further medical issues. Please  note that NO REFILLS for any discharge medications will be authorized once you are discharged, as it is imperative that you return to your primary care physician (or establish a relationship with a primary care physician if you do not have one) for your aftercare needs so that they can reassess your need for medications and monitor your lab values.    Today   CHIEF COMPLAINT:   Chief Complaint  Patient presents with  . Fever    HISTORY OF PRESENT ILLNESS:  72 y.o. female with a known history of congestive heart failure, diabetes mellitus type 2, diverticulitis, hypertension is a resident of [facility].  Patient was found to be confused and lethargic and also had fever of 101.9 F.  She was referred to the emergency room at our hospital she was hypoxic on oxygen by nasal cannula at 4 L with decreased responsiveness.  Patient was febrile and blood pressure was low around 64 / 45 mmHg.  Patient received IV fluid boluses in the emergency room and her lactic acid level was elevated.  She was found to be in septic shock  and was started on IV Levophed drip and IV fluids were started based on sepsis protocol.  Code sepsis was called.  Patient was intubated electively and put on ventilator as she was not able to maintain airway and oxygen saturation.  Her blood pressure improved to 100 x 49 mmHg after fluid boluses and IV Levophed drip was started.  Patient was put on IV propofol drip for sedation.  Hospitalist service was consulted.  CT head without contrast was also ordered in the emergency room.  Patient on ventilator unable to give any history.  VITAL SIGNS:  Blood pressure (!) 152/48, pulse 73, temperature 97.8 F (36.6 C), temperature source Oral, resp. rate 18, height 5\' 9"  (1.753 m), weight 98 kg (216 lb 0.8 oz), SpO2 92 %.  I/O:    Intake/Output Summary (Last 24 hours) at 02/06/2018 1129 Last data filed at 02/06/2018 1054 Gross per 24 hour  Intake 280 ml  Output 900 ml  Net -620  ml    PHYSICAL EXAMINATION:  GENERAL:  72 y.o.-year-old patient lying in the bed with no acute distress.  EYES: Pupils equal, round, reactive to light and accommodation. No scleral icterus. Extraocular muscles intact.  HEENT: Head atraumatic, normocephalic. Oropharynx and nasopharynx clear.  NECK:  Supple, no jugular venous distention. No thyroid enlargement, no tenderness.  LUNGS: Normal breath sounds bilaterally, no wheezing, rales,rhonchi or crepitation. No use of accessory muscles of respiration.  CARDIOVASCULAR: S1, S2 normal. No murmurs, rubs, or gallops.  ABDOMEN: Soft, non-tender, non-distended. Bowel sounds present. No organomegaly or mass.  EXTREMITIES: No pedal edema, cyanosis, or clubbing.  NEUROLOGIC: Cranial nerves II through XII are intact. Muscle strength 5/5 in all extremities. Sensation intact. Gait not checked.  PSYCHIATRIC: The patient is alert and oriented x 3.  SKIN: No obvious rash, lesion, or ulcer.   DATA REVIEW:   CBC Recent Labs  Lab 02/05/18 0518  WBC 6.7  HGB 10.0*  HCT 31.4*  PLT 349    Chemistries  Recent Labs  Lab 01/31/18 0441 02/01/18 0311  02/05/18 0518 02/06/18 0503  NA 139 142   < > 139  --   K 3.4* 3.5   < > 3.7  --   CL 107 108   < > 99*  --   CO2 24 26   < > 31  --   GLUCOSE 176* 123*   < > 117*  --   BUN 16 19   < > 17  --   CREATININE 1.02* 0.89   < > 0.87 0.93  CALCIUM 7.9* 8.5*   < > 8.7*  --   MG 1.9  --   --   --   --   AST  --  41  --   --   --   ALT  --  77*  --   --   --   ALKPHOS  --  48  --   --   --   BILITOT  --  0.6  --   --   --    < > = values in this interval not displayed.    Cardiac Enzymes Recent Labs  Lab 02/02/18 0223  TROPONINI 0.20*    Microbiology Results  Results for orders placed or performed during the hospital encounter of 01/30/18  Blood Culture (routine x 2)     Status: None   Collection Time: 01/30/18  6:11 PM  Result Value Ref Range Status   Specimen Description BLOOD  Final  Special Requests   Final    BOTTLES DRAWN AEROBIC AND ANAEROBIC Blood Culture adequate volume   Culture   Final    NO GROWTH 5 DAYS Performed at [facility], [address] [place], [place] [address]    Report Status 02/04/2018 FINAL  Final  Blood Culture (routine x 2)     Status: None   Collection Time: 01/30/18  6:11 PM  Result Value Ref Range Status   Specimen Description BLOOD  Final   Special Requests BOTTLES DRAWN AEROBIC AND ANAEROBIC BCAV  Final   Culture   Final    NO GROWTH 5 DAYS Performed at [facility] [address], [place], [place] [address]    Report Status 02/04/2018 FINAL  Final  Urine culture     Status: None   Collection Time: 01/30/18  6:11 PM  Result Value Ref Range Status   Specimen Description   Final    URINE, RANDOM Performed at [facility], [address], [place], [place] [address]    Special Requests   Final    NONE Performed at [facility], [address] [place], [place] [address]    Culture   Final    NO GROWTH Performed at [facility], [address] [place], [place] [address]    Report Status 02/01/2018 FINAL  Final  MRSA PCR Screening     Status: None   Collection Time: 01/30/18  8:27 PM  Result Value Ref Range Status   MRSA by PCR NEGATIVE NEGATIVE Final    Comment:        The GeneXpert MRSA Assay (FDA approved for NASAL specimens only), is one component of a comprehensive MRSA colonization surveillance program. It is not intended to diagnose MRSA infection nor to guide or monitor treatment for MRSA infections. Performed at [facility], [address] [place], [place] [address]     RADIOLOGY:  No results found.  EKG:   Orders placed or performed during the hospital encounter of 01/30/18  . EKG 12-Lead  . EKG 12-Lead      Management plans discussed with the patient, family and they are in agreement.  CODE STATUS:     Code Status Orders  (From  admission, onward)        Start     Ordered   01/30/18 1826  Full code  Continuous     01/30/18 1826    Code Status History    Date Active Date Inactive Code Status Order ID Comments User Context   01/12/2018 11:18 01/13/2018 19:39 Full Code [id number]  Houston Siren, MD Inpatient      TOTAL TIME TAKING CARE OF THIS PATIENT: 45 minutes.    Evelena Asa Denai Caba M.D on 02/06/2018 at 11:29 AM  Between 7am to 6pm - Pager - [phone]  After 6pm go to [email or web address] - password [email or web address]  [facility]  Office  [phone]  CC: Primary care physician; Margaretann Loveless, MD   Note: This dictation was prepared with Dragon dictation along with smaller phrase technology. Any transcriptional errors that result from this process are unintentional.

## 2018-02-06 NOTE — Care Management Important Message (Signed)
Important Message  Patient Details  Name: Virginia CreeGlenda C Chivers MRN: [id number] Date of Birth: [birth year]   Medicare Important Message Given:  Yes    Chapman FitchBOWEN, Gean Laursen T, RN 02/06/2018, 11:41 AM

## 2018-02-17 ENCOUNTER — Encounter: Payer: Self-pay | Admitting: Family

## 2018-02-17 ENCOUNTER — Ambulatory Visit: Payer: Medicare HMO | Attending: Family | Admitting: Family

## 2018-02-17 DIAGNOSIS — I110 Hypertensive heart disease with heart failure: Secondary | ICD-10-CM | POA: Insufficient documentation

## 2018-02-17 DIAGNOSIS — I10 Essential (primary) hypertension: Secondary | ICD-10-CM | POA: Insufficient documentation

## 2018-02-17 DIAGNOSIS — E119 Type 2 diabetes mellitus without complications: Secondary | ICD-10-CM | POA: Insufficient documentation

## 2018-02-17 DIAGNOSIS — Z87891 Personal history of nicotine dependence: Secondary | ICD-10-CM | POA: Diagnosis not present

## 2018-02-17 DIAGNOSIS — I890 Lymphedema, not elsewhere classified: Secondary | ICD-10-CM | POA: Insufficient documentation

## 2018-02-17 DIAGNOSIS — I4891 Unspecified atrial fibrillation: Secondary | ICD-10-CM | POA: Insufficient documentation

## 2018-02-17 DIAGNOSIS — I509 Heart failure, unspecified: Secondary | ICD-10-CM | POA: Diagnosis not present

## 2018-02-17 DIAGNOSIS — I5032 Chronic diastolic (congestive) heart failure: Secondary | ICD-10-CM

## 2018-02-17 DIAGNOSIS — Z794 Long term (current) use of insulin: Secondary | ICD-10-CM | POA: Insufficient documentation

## 2018-02-17 DIAGNOSIS — I480 Paroxysmal atrial fibrillation: Secondary | ICD-10-CM

## 2018-02-17 DIAGNOSIS — Z7951 Long term (current) use of inhaled steroids: Secondary | ICD-10-CM | POA: Insufficient documentation

## 2018-02-17 DIAGNOSIS — Z79899 Other long term (current) drug therapy: Secondary | ICD-10-CM | POA: Insufficient documentation

## 2018-02-17 DIAGNOSIS — E109 Type 1 diabetes mellitus without complications: Secondary | ICD-10-CM

## 2018-02-17 NOTE — Progress Notes (Signed)
Patient ID: Virginia Barnes, female    DOB: Oct 12, 1946, 72 y.o.   MRN: [id number]  HPI  Virginia Barnes is a 72 y/o female with a history of DM, HTN, atrial fibrillation, former smoker and chronic heart failure.   Echo report from 02/04/18 reviewed and showed an EF of 60-65%. Cardiac catheterization done May 2017 showed normal coronary arteries.   Admitted 01/30/18 due to acute sepsis. Initially needed vasopressors but those were weaned off. Had to initially be intubated as well. Initially given IV lasix and then transitioned to oral diuretics. Admitted 01/12/18 due to severe hypoglycemia. Head CT was done due to altered mental status and was negative. Discharged the following day. Was in the ED 01/11/18 due to a fall where she was evaluated and released.   She presents today for her initial visit with a chief complaint of minimal fatigue upon moderate exertion. She says that this has been present for many years and has been improving recently. She has associated shortness of breath, edema and rhinorrhea along with this. She denies any difficulty sleeping, abdominal distention, palpitations, chest pain, dizziness, cough or weight gain.   Past Medical History:  Diagnosis Date  . Arrhythmia    atrial fibrillation  . CHF (congestive heart failure) (HCC)   . Diabetes mellitus without complication (HCC)   . Diverticulitis   . Hypertension    Past Surgical History:  Procedure Laterality Date  . ABDOMINAL HYSTERECTOMY    . CARDIAC CATHETERIZATION Left 04/13/2016   Procedure: Left Heart Cath and Coronary Angiography;  Surgeon: Laurier Nancy, MD;  Location: [facility] INVASIVE CV LAB;  Service: Cardiovascular;  Laterality: Left;  . JOINT REPLACEMENT     Family History  Problem Relation Age of Onset  . Stomach cancer Sister 2  . Vaginal cancer Sister 49  . Heart disease Father   . Breast cancer Neg Hx    Social History   Tobacco Use  . Smoking status: Former Smoker    Packs/day: 0.50    Years: 15.00     Pack years: 7.50    Types: Cigarettes  . Smokeless tobacco: Never Used  Substance Use Topics  . Alcohol use: No   Allergies  Allergen Reactions  . Peanuts [Peanut Oil] Shortness Of Breath  . Lasix [Furosemide] Swelling    Tongue swelling  . Lisinopril Swelling    Tongue swelling   Prior to Admission medications   Medication Sig Start Date End Date Taking? Authorizing Provider  ACCU-CHEK AVIVA PLUS test strip  12/26/16  Yes [provider]  ASPIRIN LOW DOSE 81 MG EC tablet Take 1 tablet by mouth daily. 09/26/15  Yes [provider]  ASSURE COMFORT LANCETS 30G MISC  12/26/16  Yes [provider]  Blood Glucose Calibration (ACCU-CHEK AVIVA) SOLN  12/26/16  Yes [provider]  Calcium Carbonate-Vitamin D3 (CALCIUM 600/VITAMIN D) 600-400 MG-UNIT TABS Take 1 tablet by mouth 2 (two) times daily.   Yes [provider]  cetirizine (ZYRTEC) 10 MG tablet Take 10 mg by mouth every evening.   Yes [provider]  clotrimazole (LOTRIMIN) 1 % cream Apply 1 application topically 2 (two) times daily.   Yes [provider]  ferrous sulfate 325 (65 FE) MG EC tablet Take 325 mg by mouth daily.   Yes [provider]  fluticasone (FLONASE) 50 MCG/ACT nasal spray Place 2 sprays into both nostrils daily.   Yes [provider]  furosemide (LASIX) 40 MG tablet Take 1 tablet (40  mg total) by mouth daily. 02/06/18 02/06/19 Yes Salary, Montell D, MD  insulin aspart (NOVOLOG) 100 UNIT/ML injection 1-150 - no coverage.  151-200 - 2 units 201-250 4 units 251-300 - 6 units 301-350 - 8 units 351-400 - 10 units and call MD. Patient taking differently: Inject 0-10 Units into the skin 3 (three) times daily with meals.  01/13/18  Yes Sainani, Rolly PancakeVivek J, MD  ipratropium (ATROVENT) 0.03 % nasal spray Place 2 sprays into both nostrils 3 (three) times daily as needed for rhinitis.   Yes [provider]  omeprazole (PRILOSEC) 40 MG capsule Take  1 capsule by mouth daily. 09/26/15  Yes [provider]  potassium chloride (K-DUR) 10 MEQ tablet Take 10 mEq by mouth daily.    Yes [provider]  rosuvastatin (CRESTOR) 20 MG tablet Take 20 mg by mouth daily.   Yes [provider]  sertraline (ZOLOFT) 100 MG tablet Take 100 mg by mouth daily.   Yes [provider]  sotalol (BETAPACE) 80 MG tablet 80 mg 2 (two) times daily.    Yes [provider]  traMADol (ULTRAM) 50 MG tablet Take 1 tablet (50 mg total) by mouth 2 (two) times daily as needed. 02/06/18 02/06/19 Yes Salary, Evelena AsaMontell D, MD   Review of Systems  Constitutional: Positive for fatigue. Negative for appetite change.  HENT: Positive for congestion and rhinorrhea. Negative for sore throat.   Eyes: Negative.   Respiratory: Positive for shortness of breath. Negative for cough and chest tightness.   Cardiovascular: Positive for leg swelling. Negative for chest pain and palpitations.  Gastrointestinal: Negative for abdominal distention and abdominal pain.  Endocrine: Negative.   Genitourinary: Negative.   Musculoskeletal: Negative for back pain and neck pain.  Skin: Negative.   Allergic/Immunologic: Negative.   Neurological: Negative for dizziness and light-headedness.  Hematological: Negative for adenopathy. Does not bruise/bleed easily.  Psychiatric/Behavioral: Negative for dysphoric mood and sleep disturbance (sleeping on 2 pillows). The patient is not nervous/anxious.    Vitals:   02/17/18 1050  BP: (!) 130/47  Pulse: 63  Resp: 16  SpO2: 99%  Weight: 202 lb (91.6 kg)  Height: 5\' 9"  (1.753 m)   Wt Readings from Last 3 Encounters:  02/17/18 202 lb (91.6 kg)  02/01/18 216 lb 0.8 oz (98 kg)  01/12/18 229 lb (103.9 kg)   Lab Results  Component Value Date   CREATININE 0.93 02/06/2018   CREATININE 0.87 02/05/2018   CREATININE 0.89 02/03/2018   Physical Exam  Constitutional: She is oriented to person, place, and time. She appears  well-developed and well-nourished.  HENT:  Head: Normocephalic and atraumatic.  Neck: Normal range of motion. Neck supple. No JVD present.  Cardiovascular: Regular rhythm. Bradycardia present.  Pulmonary/Chest: Effort normal. She has no wheezes. She has no rales.  Abdominal: Soft. She exhibits no distension. There is no tenderness.  Musculoskeletal: She exhibits edema (2+ pitting edema in bilateral lower legs). She exhibits no tenderness.  Neurological: She is alert and oriented to person, place, and time.  Skin: Skin is warm and dry.  Psychiatric: She has a normal mood and affect. Her behavior is normal. Thought content normal.  Nursing note and vitals reviewed.  Assessment & Plan:  1: Chronic heart failure with preserved ejection fraction- - NYHA class II - mildly fluid overloaded today with pedal edema - being weighed daily. Order written for [facility] to call for an overnight weight gain of >2 pounds or a weekly weight gain of >5 pounds -  not adding salt to her food and caregiver doesn't think the food is cooked with salt - BNP 02/02/18 was 1075.0  2: HTN- - BP looks good today - normally sees PCP Valrie Hart) when she's at [facility] - BMP on 02/05/18 was reviewed and showed sodium 139, potassium 3.7 and GFR >60  3: Atrial fibrillation- - currently rate controlled at this time - taking aspirin and sotalol - sees cardiology Welton Flakes)  4: Lymphedema- - stage 2 - not wearing TED hose; order written for [facility] to put TED hose on daily with removal at bedtime - also encouraged her to elevate her legs when she's sitting for long periods of time - currently receiving PT - consider lymphapress compression boots if edema persist  5: Diabetes- - nonfasting glucose at the facility this morning was 148 - using novolog with meals - A1c on 04/19/13 was 6.7%  Facility medication list was reviewed.  Return in 1 month or sooner for any questions/problems before then.

## 2018-02-17 NOTE — Patient Instructions (Signed)
Continue weighing daily and call for an overnight weight gain of > 2 pounds or a weekly weight gain of >5 pounds. 

## 2018-03-21 NOTE — Progress Notes (Signed)
Patient ID: Virginia Barnes, female    DOB: 04-09-1946, 72 y.o.   MRN: [id number]  HPI  Virginia Barnes is a 72 y/o female with a history of DM, HTN, atrial fibrillation, former smoker and chronic heart failure.   Echo report from 02/04/18 reviewed and showed an EF of 60-65%. Cardiac catheterization done May 2017 showed normal coronary arteries.   Admitted 01/30/18 due to acute sepsis. Initially needed vasopressors but those were weaned off. Had to initially be intubated as well. Initially given IV lasix and then transitioned to oral diuretics. Admitted 01/12/18 due to severe hypoglycemia. Head CT was done due to altered mental status and was negative. Discharged the following day. Was in the ED 01/11/18 due to a fall where she was evaluated and released.   She presents today for a follow-up visit with a chief complaint of minimal fatigue upon moderate exertion. She describes this as chronic in nature having been present for several years. She has associated head congestion, improving leg edema and minimal weight gain. She denies any difficulty sleeping, abdominal distention, palpitations, chest pain, shortness of breath, cough, dizziness or change in her appetite. Does admit to having urinary frequency and occasionally incontinence due to furosemide.   Past Medical History:  Diagnosis Date  . Arrhythmia    atrial fibrillation  . CHF (congestive heart failure) (HCC)   . Diabetes mellitus without complication (HCC)   . Diverticulitis   . Hypertension    Past Surgical History:  Procedure Laterality Date  . ABDOMINAL HYSTERECTOMY    . CARDIAC CATHETERIZATION Left 04/13/2016   Procedure: Left Heart Cath and Coronary Angiography;  Surgeon: Laurier Nancy, MD;  Location: [facility] INVASIVE CV LAB;  Service: Cardiovascular;  Laterality: Left;  . JOINT REPLACEMENT     Family History  Problem Relation Age of Onset  . Stomach cancer Sister 3  . Vaginal cancer Sister 13  . Heart disease Father   . Breast cancer  Neg Hx    Social History   Tobacco Use  . Smoking status: Former Smoker    Packs/day: 0.50    Years: 15.00    Pack years: 7.50    Types: Cigarettes  . Smokeless tobacco: Never Used  Substance Use Topics  . Alcohol use: No   Allergies  Allergen Reactions  . Peanuts [Peanut Oil] Shortness Of Breath  . Lasix [Furosemide] Swelling    Tongue swelling  . Lisinopril Swelling    Tongue swelling   Prior to Admission medications   Medication Sig Start Date End Date Taking? Authorizing Provider  ACCU-CHEK AVIVA PLUS test strip  12/26/16  Yes [provider]  ASPIRIN LOW DOSE 81 MG EC tablet Take 1 tablet by mouth daily. 09/26/15  Yes [provider]  ASSURE COMFORT LANCETS 30G MISC  12/26/16  Yes [provider]  Blood Glucose Calibration (ACCU-CHEK AVIVA) SOLN  12/26/16  Yes [provider]  Calcium Carbonate-Vitamin D3 (CALCIUM 600/VITAMIN D) 600-400 MG-UNIT TABS Take 1 tablet by mouth 2 (two) times daily.   Yes [provider]  cetirizine (ZYRTEC) 10 MG tablet Take 10 mg by mouth every evening.   Yes [provider]  clotrimazole (LOTRIMIN) 1 % cream Apply 1 application topically 2 (two) times daily.   Yes [provider]  ferrous sulfate 325 (65 FE) MG EC tablet Take 325 mg by mouth daily.   Yes [provider]  fesoterodine (TOVIAZ) 8 MG TB24 tablet Take 8 mg by mouth daily.  Yes [provider]  fluticasone (FLONASE) 50 MCG/ACT nasal spray Place 2 sprays into both nostrils daily.   Yes [provider]  furosemide (LASIX) 40 MG tablet Take 1 tablet (40 mg total) by mouth daily. 02/06/18 02/06/19 Yes Salary, Evelena AsaMontell D, MD  hydrALAZINE (APRESOLINE) 25 MG tablet Take 25 mg by mouth 2 (two) times daily.   Yes [provider]  insulin aspart (NOVOLOG) 100 UNIT/ML injection 1-150 - no coverage.  151-200 - 2 units 201-250 4 units 251-300 - 6 units 301-350 - 8 units 351-400 - 10 units and call  MD. Patient taking differently: Inject 0-10 Units into the skin 3 (three) times daily with meals.  01/13/18  Yes Sainani, Rolly PancakeVivek J, MD  ipratropium (ATROVENT) 0.03 % nasal spray Place 2 sprays into both nostrils 3 (three) times daily as needed for rhinitis.   Yes [provider]  nystatin (MYCOSTATIN/NYSTOP) powder Apply topically 2 (two) times daily. Apply topically beneath bilateral breasts BID   Yes [provider]  omeprazole (PRILOSEC) 40 MG capsule Take 1 capsule by mouth daily. 09/26/15  Yes [provider]  potassium chloride (K-DUR) 10 MEQ tablet Take 10 mEq by mouth daily.    Yes [provider]  rosuvastatin (CRESTOR) 20 MG tablet Take 20 mg by mouth daily.   Yes [provider]  sertraline (ZOLOFT) 100 MG tablet Take 100 mg by mouth daily.   Yes [provider]  sotalol (BETAPACE) 80 MG tablet 80 mg 2 (two) times daily.    Yes [provider]  spironolactone (ALDACTONE) 50 MG tablet Take 50 mg by mouth daily.   Yes [provider]  traMADol (ULTRAM) 50 MG tablet Take 1 tablet (50 mg total) by mouth 2 (two) times daily as needed. 02/06/18 02/06/19 Yes Salary, Evelena AsaMontell D, MD    Review of Systems  Constitutional: Positive for fatigue. Negative for appetite change.  HENT: Positive for congestion and rhinorrhea. Negative for sore throat.   Eyes: Negative.   Respiratory: Negative for cough, chest tightness and shortness of breath.   Cardiovascular: Positive for leg swelling (improving). Negative for chest pain and palpitations.  Gastrointestinal: Negative for abdominal distention and abdominal pain.  Endocrine: Negative.   Genitourinary: Negative.   Musculoskeletal: Negative for back pain and neck pain.  Skin: Negative.   Allergic/Immunologic: Negative.   Neurological: Negative for dizziness and light-headedness.  Hematological: Negative for adenopathy. Does not bruise/bleed easily.  Psychiatric/Behavioral: Negative for  dysphoric mood and sleep disturbance (sleeping on 2 pillows). The patient is not nervous/anxious.    Vitals:   03/24/18 1008  BP: (!) 136/57  Pulse: 74  Resp: 18  SpO2: 100%  Weight: 206 lb (93.4 kg)  Height: 5\' 9"  (1.753 m)   Wt Readings from Last 3 Encounters:  03/24/18 206 lb (93.4 kg)  02/17/18 202 lb (91.6 kg)  02/01/18 216 lb 0.8 oz (98 kg)   Lab Results  Component Value Date   CREATININE 0.93 02/06/2018   CREATININE 0.87 02/05/2018   CREATININE 0.89 02/03/2018    Physical Exam  Constitutional: She is oriented to person, place, and time. She appears well-developed and well-nourished.  HENT:  Head: Normocephalic and atraumatic.  Neck: Normal range of motion. Neck supple. No JVD present.  Cardiovascular: Normal rate and regular rhythm.  Pulmonary/Chest: Effort normal. She has no wheezes. She has no rales.  Abdominal: Soft. She exhibits no distension. There is no tenderness.  Musculoskeletal: She exhibits no edema or tenderness.  Neurological: She  is alert and oriented to person, place, and time.  Skin: Skin is warm and dry.  Psychiatric: She has a normal mood and affect. Her behavior is normal. Thought content normal.  Nursing note and vitals reviewed.  Assessment & Plan:  1: Chronic heart failure with preserved ejection fraction- - NYHA class II - euvolemic today - being weighed daily at [facility]. Reinforced to have them call for an overnight weight gain of >2 pounds or a weekly weight gain of >5 pounds - stated weight is 4 pounds heavier from 02/17/18 - not adding salt to her food  - now wearing TED hose with resolution of edema; does try to elevate them some during the day - BNP 02/02/18 was 1075.0  2: HTN- - BP looks good today - normally sees PCP Valrie Hart) when she's at [facility] - BMP on 02/05/18 was reviewed and showed sodium 139, potassium 3.7 and GFR >60  3: Atrial fibrillation- - currently rate controlled at this time - taking aspirin and  sotalol - sees cardiology Welton Flakes)  4: Diabetes- - nonfasting glucose at the facility this morning was 136 - using novolog with meals - A1c on 04/19/13 was 6.7%  Facility medication list was reviewed.  Patient opts to not make a return appointment at this time. Advised her that she or [facility] could call back at anytime to make another appointment.

## 2018-03-24 ENCOUNTER — Ambulatory Visit: Payer: Medicare HMO | Attending: Family | Admitting: Family

## 2018-03-24 ENCOUNTER — Encounter: Payer: Self-pay | Admitting: Family

## 2018-03-24 VITALS — BP 136/57 | HR 74 | Resp 18 | Ht 69.0 in | Wt 206.0 lb

## 2018-03-24 DIAGNOSIS — I509 Heart failure, unspecified: Secondary | ICD-10-CM | POA: Diagnosis not present

## 2018-03-24 DIAGNOSIS — Z8249 Family history of ischemic heart disease and other diseases of the circulatory system: Secondary | ICD-10-CM | POA: Insufficient documentation

## 2018-03-24 DIAGNOSIS — Z9889 Other specified postprocedural states: Secondary | ICD-10-CM | POA: Diagnosis not present

## 2018-03-24 DIAGNOSIS — I10 Essential (primary) hypertension: Secondary | ICD-10-CM

## 2018-03-24 DIAGNOSIS — Z79899 Other long term (current) drug therapy: Secondary | ICD-10-CM | POA: Diagnosis not present

## 2018-03-24 DIAGNOSIS — Z90710 Acquired absence of both cervix and uterus: Secondary | ICD-10-CM | POA: Diagnosis not present

## 2018-03-24 DIAGNOSIS — Z87891 Personal history of nicotine dependence: Secondary | ICD-10-CM | POA: Diagnosis not present

## 2018-03-24 DIAGNOSIS — E11649 Type 2 diabetes mellitus with hypoglycemia without coma: Secondary | ICD-10-CM | POA: Diagnosis not present

## 2018-03-24 DIAGNOSIS — Z803 Family history of malignant neoplasm of breast: Secondary | ICD-10-CM | POA: Insufficient documentation

## 2018-03-24 DIAGNOSIS — I110 Hypertensive heart disease with heart failure: Secondary | ICD-10-CM | POA: Insufficient documentation

## 2018-03-24 DIAGNOSIS — Z808 Family history of malignant neoplasm of other organs or systems: Secondary | ICD-10-CM | POA: Insufficient documentation

## 2018-03-24 DIAGNOSIS — Z794 Long term (current) use of insulin: Secondary | ICD-10-CM | POA: Insufficient documentation

## 2018-03-24 DIAGNOSIS — I480 Paroxysmal atrial fibrillation: Secondary | ICD-10-CM

## 2018-03-24 DIAGNOSIS — I4891 Unspecified atrial fibrillation: Secondary | ICD-10-CM | POA: Diagnosis not present

## 2018-03-24 DIAGNOSIS — I5032 Chronic diastolic (congestive) heart failure: Secondary | ICD-10-CM

## 2018-03-24 DIAGNOSIS — E109 Type 1 diabetes mellitus without complications: Secondary | ICD-10-CM

## 2018-03-24 NOTE — Patient Instructions (Signed)
Continue weighing daily and call for an overnight weight gain of > 2 pounds or a weekly weight gain of >5 pounds. 

## 2018-04-11 ENCOUNTER — Other Ambulatory Visit: Payer: Self-pay

## 2018-04-11 ENCOUNTER — Emergency Department
Admission: EM | Admit: 2018-04-11 | Discharge: 2018-04-11 | Disposition: A | Discharge status: Home / Self Care | Payer: Medicare HMO | Attending: Emergency Medicine | Admitting: Emergency Medicine

## 2018-04-11 ENCOUNTER — Encounter: Payer: Self-pay | Admitting: Emergency Medicine

## 2018-04-11 ENCOUNTER — Emergency Department: Payer: Medicare HMO

## 2018-04-11 DIAGNOSIS — S0003XA Contusion of scalp, initial encounter: Secondary | ICD-10-CM | POA: Diagnosis not present

## 2018-04-11 DIAGNOSIS — E119 Type 2 diabetes mellitus without complications: Secondary | ICD-10-CM | POA: Insufficient documentation

## 2018-04-11 DIAGNOSIS — W01198A Fall on same level from slipping, tripping and stumbling with subsequent striking against other object, initial encounter: Secondary | ICD-10-CM | POA: Diagnosis not present

## 2018-04-11 DIAGNOSIS — Z91010 Allergy to peanuts: Secondary | ICD-10-CM | POA: Diagnosis not present

## 2018-04-11 DIAGNOSIS — R300 Dysuria: Secondary | ICD-10-CM | POA: Insufficient documentation

## 2018-04-11 DIAGNOSIS — Y92010 Kitchen of single-family (private) house as the place of occurrence of the external cause: Secondary | ICD-10-CM | POA: Insufficient documentation

## 2018-04-11 DIAGNOSIS — N3000 Acute cystitis without hematuria: Secondary | ICD-10-CM | POA: Diagnosis not present

## 2018-04-11 DIAGNOSIS — S0990XA Unspecified injury of head, initial encounter: Secondary | ICD-10-CM | POA: Diagnosis present

## 2018-04-11 DIAGNOSIS — I5032 Chronic diastolic (congestive) heart failure: Secondary | ICD-10-CM | POA: Diagnosis not present

## 2018-04-11 DIAGNOSIS — W19XXXA Unspecified fall, initial encounter: Secondary | ICD-10-CM

## 2018-04-11 DIAGNOSIS — I110 Hypertensive heart disease with heart failure: Secondary | ICD-10-CM | POA: Diagnosis not present

## 2018-04-11 DIAGNOSIS — Y998 Other external cause status: Secondary | ICD-10-CM | POA: Insufficient documentation

## 2018-04-11 DIAGNOSIS — Y9389 Activity, other specified: Secondary | ICD-10-CM | POA: Insufficient documentation

## 2018-04-11 DIAGNOSIS — Z87891 Personal history of nicotine dependence: Secondary | ICD-10-CM | POA: Diagnosis not present

## 2018-04-11 LAB — URINALYSIS, COMPLETE (UACMP) WITH MICROSCOPIC
Bilirubin Urine: NEGATIVE
Glucose, UA: NEGATIVE mg/dL
HGB URINE DIPSTICK: NEGATIVE
KETONES UR: NEGATIVE mg/dL
NITRITE: POSITIVE — AB
Specific Gravity, Urine: 1.019 (ref 1.005–1.030)
pH: 8 (ref 5.0–8.0)

## 2018-04-11 MED ORDER — CEPHALEXIN 500 MG PO CAPS
500.0000 mg | ORAL_CAPSULE | Freq: Once | ORAL | Status: AC
  Administered 2018-04-11: 500 mg via ORAL
  Filled 2018-04-11: qty 1

## 2018-04-11 MED ORDER — CEPHALEXIN 500 MG PO CAPS: 500.0000 mg | ORAL_CAPSULE | Freq: Four times a day (QID) | ORAL | 0 refills | Status: AC

## 2018-04-11 NOTE — ED Notes (Signed)
ED Provider at bedside. 

## 2018-04-11 NOTE — ED Notes (Signed)
Attempt iv insertion x1 without success.  

## 2018-04-11 NOTE — ED Notes (Signed)
Report from jordyn, rn.  

## 2018-04-11 NOTE — Discharge Instructions (Addendum)
Please apply ice to the bump on the back of your head for 20 minutes every 2 hours when you are awake.  You may take Tylenol or Motrin for pain.  Please take the entire course of antibiotics for your urinary tract infection, even if you are feeling better.  Please follow-up with your primary care physician.  Return to the emergency department if you develop severe pain, vomiting, numbness tingling or weakness, chest pain, lightheadedness, fever, or any other symptoms concerning to you.

## 2018-04-11 NOTE — ED Provider Notes (Signed)
[facility] Emergency Department Provider Note  ____________________________________________  Time seen: Approximately 10:23 PM  I have reviewed the triage vital signs and the nursing notes.   HISTORY  Chief Complaint Fall    HPI Virginia Barnes is a 72 y.o. female with a history of A. fib on aspirin only, HTN, CHF, DM, resenting with a posterior scalp contusion after a fall.  The patient reports that she was getting milk from the fridge when she lost her balance and fell backwards striking her head.  She did not lose consciousness.  She had no associated chest pain, shortness of breath, palpitations, lightheadedness or fainting.  She did immediately began to have swelling on the back of the head but has not had any severe headache, nausea or vomiting.  She had no associated visual or speech changes, confusion, numbness tingling or weakness.  She has no extremity pain, neck pain or back pain.  The nurse notes the patient has foul-smelling urine and she does state that she has had some dysuria.  Past Medical History:  Diagnosis Date  . Arrhythmia    atrial fibrillation  . CHF (congestive heart failure) (HCC)   . Diabetes mellitus without complication (HCC)   . Diverticulitis   . Hypertension     Patient Active Problem List   Diagnosis Date Noted  . Chronic diastolic heart failure (HCC) 02/17/2018  . HTN (hypertension) 02/17/2018  . Atrial fibrillation (HCC) 02/17/2018  . Lymphedema 02/17/2018  . Diabetes (HCC) 02/17/2018  . Sepsis (HCC) 01/30/2018  . Hypoglycemia 01/12/2018  . Knee pain 05/17/2017    Past Surgical History:  Procedure Laterality Date  . ABDOMINAL HYSTERECTOMY    . CARDIAC CATHETERIZATION Left 04/13/2016   Procedure: Left Heart Cath and Coronary Angiography;  Surgeon: Laurier Nancy, MD;  Location: [facility] INVASIVE CV LAB;  Service: Cardiovascular;  Laterality: Left;  . JOINT REPLACEMENT      Current Outpatient Rx  . Order #:  [id number] Class: Historical Med  . Order #: [id number] Class: Historical Med  . Order #: [id number] Class: Historical Med  . Order #: [id number] Class: Historical Med  . Order #: [id number] Class: Historical Med  . Order #: [id number] Class: Print  . Order #: [id number] Class: Historical Med  . Order #: [id number] Class: Historical Med  . Order #: [id number] Class: Historical Med  . Order #: [id number] Class: Historical Med  . Order #: [id number] Class: Historical Med  . Order #: [id number] Class: Print  . Order #: [id number] Class: Historical Med  . Order #: [id number] Class: No Print  . Order #: [id number] Class: Historical Med  . Order #: [id number] Class: Historical Med  . Order #: [id number] Class: Historical Med  . Order #: [id number] Class: Historical Med  . Order #: [id number] Class: Historical Med  . Order #: [id number] Class: Historical Med  . Order #: [id number] Class: Historical Med  . Order #: [id number] Class: Historical Med  . Order #: [id number] Class: Print    Allergies Peanuts [peanut oil]; Lasix [furosemide]; and Lisinopril  Family History  Problem Relation Age of Onset  . Stomach cancer Sister 71  . Vaginal cancer Sister 41  . Heart disease Father   . Breast cancer Neg Hx     Social History Social History   Tobacco Use  . Smoking status: Former Smoker    Packs/day: 0.50    Years: 15.00    Pack years: 7.50    Types: Cigarettes  . Smokeless tobacco: Never Used  Substance Use Topics  . Alcohol use: No  . Drug use: No    Review of  Systems Constitutional: No fever/chills.  No lightheadedness or syncope.  Positive mechanical fall. Eyes: No visual changes.  No blurred or double vision. ENT: No sore throat. No congestion or rhinorrhea. HEAD: Posterior scalp contusion. Cardiovascular: Denies chest pain. Denies palpitations. Respiratory: Denies shortness of breath.  No cough. Gastrointestinal: No abdominal pain.  No nausea, no vomiting.  No diarrhea.  No constipation. Genitourinary: Positive  for dysuria. Musculoskeletal: Negative for back pain, neck pain, or extremity pain. Skin: Negative for rash. Neurological: Negative for headaches. No focal numbness, tingling or weakness.     ____________________________________________   PHYSICAL EXAM:  VITAL SIGNS: ED Triage Vitals  Enc Vitals Group     BP 04/11/18 2005 (!) 144/56     Pulse Rate 04/11/18 2005 73     Resp 04/11/18 2005 16     Temp 04/11/18 2005 98.5 F (36.9 C)     Temp Source 04/11/18 2005 Oral     SpO2 04/11/18 2005 98 %     Weight --      Height --      Head Circumference --      Peak Flow --      Pain Score 04/11/18 2006 3     Pain Loc --      Pain Edu? --      Excl. in GC? --     Constitutional: Alert.  Chronically ill appearing and in no acute distress. Answers questions appropriately. Eyes: Conjunctivae are normal.  EOMI. PERRLA.  No scleral icterus.  No raccoon eyes. Head: Large scalp hematoma approximately 6 x 6 inches, the size of half a grapefruit, on the posterior aspect of the scalp.  No overlying abrasion or laceration.  No palpable skull deformity underneath. Nose: No congestion/rhinnorhea.  No swelling over the nose.  No septal hematoma. Mouth/Throat: Mucous membranes are mildly dry.  No dental injury or malocclusion. Neck: No stridor.  Supple.  No midline C-spine tenderness to palpation, step-offs or deformities. Cardiovascular: Normal rate, regular rhythm. No murmurs, rubs or gallops.  Respiratory: Normal respiratory effort.  No accessory muscle use or retractions. Lungs CTAB.  No wheezes, rales or ronchi. Gastrointestinal: Morbidly obese.  SOft, nontender and nondistended.  No guarding or rebound.  No peritoneal signs. Musculoskeletal:  Pelvis is stable.  Full range of motion of the bilateral hips, knees, ankles without pain.  Full range of motion of the bilateral shoulders, elbows, wrists without pain.  Mild symmetric lower extremity edema.  Neurologic:  A&Ox3.  Speech is clear.  Face  and smile are symmetric.  EOMI. PERRLA.  No horizontal or vertical nystagmus.  Moves all extremities well. Skin:  Skin is warm, dry and intact. No rash noted. Psychiatric: Mood and affect are normal. Speech and behavior are normal.  Normal judgement.  ____________________________________________   LABS (all labs ordered are listed, but only abnormal results are displayed)  Labs Reviewed  URINALYSIS, COMPLETE (UACMP) WITH MICROSCOPIC - Abnormal; Notable for the following components:      Result Value   Color, Urine YELLOW (*)    APPearance CLOUDY (*)    Protein, ur >=300 (*)    Nitrite POSITIVE (*)    Leukocytes, UA LARGE (*)    WBC, UA >50 (*)    Bacteria, UA FEW (*)    All other components within normal limits  URINE CULTURE   ____________________________________________  EKG  ED ECG REPORT I, Rockne Menghini, the attending physician, personally viewed and interpreted this ECG.   Date: 04/11/2018  EKG Time:  2240  Rate: 75  Rhythm: normal sinus rhythm  Axis: normal  Intervals:none  ST&T Change: No STEMI  ____________________________________________  RADIOLOGY  Ct Head Wo Contrast  Result Date: 04/11/2018 CLINICAL DATA:  72 year old female status post fall. Posterior head hematoma. EXAM: CT HEAD WITHOUT CONTRAST TECHNIQUE: Contiguous axial images were obtained from the base of the skull through the vertex without intravenous contrast. COMPARISON:  Head CT 01/30/2018 and earlier. FINDINGS: Brain: Stable cerebral volume. No midline shift, ventriculomegaly, mass effect, evidence of mass lesion, intracranial hemorrhage or evidence of cortically based acute infarction. Gray-white matter differentiation is stable and within normal limits for age throughout the brain. Vascular: Calcified atherosclerosis at the skull base. No suspicious intracranial vascular hyperdensity. Skull: Stable and intact. Sinuses/Orbits: Visualized paranasal sinuses and mastoids are well pneumatized  today. Other: Along the posterosuperior scalp there is a broad-based hematoma with a confluent component measuring up to 10 millimeters in thickness, but superimposed additional nodular, subcutaneous components. The underlying calvarium appears intact. No soft tissue gas identified. The remaining scalp and visible orbits soft tissues appear normal. IMPRESSION: 1. Scalp hematoma without underlying skull fracture. 2. Stable and negative for age non contrast CT appearance of the brain. Electronically Signed   By: Odessa Fleming M.D.   On: 04/11/2018 20:57    ____________________________________________   PROCEDURES  Procedure(s) performed: None  Procedures  Critical Care performed: No ____________________________________________   INITIAL IMPRESSION / ASSESSMENT AND PLAN / ED COURSE  Pertinent labs & imaging results that were available during my care of the patient were reviewed by me and considered in my medical decision making (see chart for details).  72 y.o. female with a mechanical fall backwards, without loss of consciousness or any neurologic deficit.  Overall, the patient is hemodynamically stable.  Her EKG does not show any ischemic changes and she has not had any associated red flag warning symptoms.  There are no focal neurologic deficits on her examination.  She takes an aspirin but is otherwise not anticoagulated.  We will check her urine for UTI, and obtain a CT to evaluate for any skull fracture or intracranial injury.  Plan reevaluation for final disposition.  ----------------------------------------- 10:29 PM on 04/11/2018 -----------------------------------------  The patient CT head does not show any evidence of intracranial injury.  She does have a UTI and a urine culture has been sent.  She has been given her first dose of Keflex here and will be discharged home with a prescription for a 7-day course of treatment.  The patient will follow-up with her primary care physician.   Follow-up instructions as well as return precautions have been discussed.  ____________________________________________  FINAL CLINICAL IMPRESSION(S) / ED DIAGNOSES  Final diagnoses:  Acute cystitis without hematuria  Fall, initial encounter  Contusion of scalp, initial encounter         NEW MEDICATIONS STARTED DURING THIS VISIT:  New Prescriptions   CEPHALEXIN (KEFLEX) 500 MG CAPSULE    Take 1 capsule (500 mg total) by mouth 4 (four) times daily for 7 days.      Rockne Menghini, MD 04/11/18 2255

## 2018-04-11 NOTE — ED Triage Notes (Signed)
Pt to ED via EMS from [facility] with mechanical fall. Pt states she " lost her balance" . Pt has hematoma to back of her head, takes Asprin daily.  Pt has strong urine odor. PT A&Ox4, VSS

## 2018-04-15 LAB — URINE CULTURE: Culture: >=100000 — AB

## 2018-05-13 ENCOUNTER — Emergency Department: Payer: Medicare HMO

## 2018-05-13 ENCOUNTER — Encounter: Payer: Self-pay | Admitting: *Deleted

## 2018-05-13 ENCOUNTER — Other Ambulatory Visit: Payer: Self-pay

## 2018-05-13 ENCOUNTER — Emergency Department
Admission: EM | Admit: 2018-05-13 | Discharge: 2018-05-13 | Disposition: A | Discharge status: Home / Self Care | Payer: Medicare HMO | Attending: Emergency Medicine | Admitting: Emergency Medicine

## 2018-05-13 DIAGNOSIS — Z79899 Other long term (current) drug therapy: Secondary | ICD-10-CM | POA: Insufficient documentation

## 2018-05-13 DIAGNOSIS — I5032 Chronic diastolic (congestive) heart failure: Secondary | ICD-10-CM | POA: Diagnosis not present

## 2018-05-13 DIAGNOSIS — E119 Type 2 diabetes mellitus without complications: Secondary | ICD-10-CM | POA: Insufficient documentation

## 2018-05-13 DIAGNOSIS — W0110XA Fall on same level from slipping, tripping and stumbling with subsequent striking against unspecified object, initial encounter: Secondary | ICD-10-CM | POA: Diagnosis not present

## 2018-05-13 DIAGNOSIS — W19XXXA Unspecified fall, initial encounter: Secondary | ICD-10-CM

## 2018-05-13 DIAGNOSIS — Z794 Long term (current) use of insulin: Secondary | ICD-10-CM | POA: Diagnosis not present

## 2018-05-13 DIAGNOSIS — Y92129 Unspecified place in nursing home as the place of occurrence of the external cause: Secondary | ICD-10-CM | POA: Diagnosis not present

## 2018-05-13 DIAGNOSIS — I110 Hypertensive heart disease with heart failure: Secondary | ICD-10-CM | POA: Diagnosis not present

## 2018-05-13 DIAGNOSIS — Z91010 Allergy to peanuts: Secondary | ICD-10-CM | POA: Insufficient documentation

## 2018-05-13 DIAGNOSIS — Y998 Other external cause status: Secondary | ICD-10-CM | POA: Insufficient documentation

## 2018-05-13 DIAGNOSIS — N390 Urinary tract infection, site not specified: Secondary | ICD-10-CM | POA: Insufficient documentation

## 2018-05-13 DIAGNOSIS — Y9389 Activity, other specified: Secondary | ICD-10-CM | POA: Diagnosis not present

## 2018-05-13 DIAGNOSIS — S0990XA Unspecified injury of head, initial encounter: Secondary | ICD-10-CM | POA: Diagnosis not present

## 2018-05-13 LAB — URINALYSIS, COMPLETE (UACMP) WITH MICROSCOPIC
Bilirubin Urine: NEGATIVE
GLUCOSE, UA: NEGATIVE mg/dL
Ketones, ur: NEGATIVE mg/dL
NITRITE: NEGATIVE
Protein, ur: NEGATIVE mg/dL
SPECIFIC GRAVITY, URINE: 1.011 (ref 1.005–1.030)
pH: 6 (ref 5.0–8.0)

## 2018-05-13 LAB — CBC WITH DIFFERENTIAL/PLATELET
BASOS ABS: 0.1 10*3/uL (ref 0–0.1)
BASOS PCT: 1 %
EOS PCT: 0 %
Eosinophils Absolute: 0 10*3/uL (ref 0–0.7)
HEMATOCRIT: 39.5 % (ref 35.0–47.0)
Hemoglobin: 12.8 g/dL (ref 12.0–16.0)
Lymphocytes Relative: 15 %
Lymphs Abs: 1.4 10*3/uL (ref 1.0–3.6)
MCH: 28.1 pg (ref 26.0–34.0)
MCHC: 32.5 g/dL (ref 32.0–36.0)
MCV: 86.6 fL (ref 80.0–100.0)
MONO ABS: 1.1 10*3/uL — AB (ref 0.2–0.9)
Monocytes Relative: 11 %
NEUTROS ABS: 6.9 10*3/uL — AB (ref 1.4–6.5)
Neutrophils Relative %: 73 %
PLATELETS: 204 10*3/uL (ref 150–440)
RBC: 4.56 MIL/uL (ref 3.80–5.20)
RDW: 15.9 % — AB (ref 11.5–14.5)
WBC: 9.5 10*3/uL (ref 3.6–11.0)

## 2018-05-13 LAB — BASIC METABOLIC PANEL
ANION GAP: 11 (ref 5–15)
BUN: 21 mg/dL — ABNORMAL HIGH (ref 6–20)
CALCIUM: 9.4 mg/dL (ref 8.9–10.3)
CO2: 27 mmol/L (ref 22–32)
Chloride: 98 mmol/L — ABNORMAL LOW (ref 101–111)
Creatinine, Ser: 1 mg/dL (ref 0.44–1.00)
GFR, EST NON AFRICAN AMERICAN: 55 mL/min — AB (ref >60)
Glucose, Bld: 152 mg/dL — ABNORMAL HIGH (ref 65–99)
Potassium: 5.3 mmol/L — ABNORMAL HIGH (ref 3.5–5.1)
SODIUM: 136 mmol/L (ref 135–145)

## 2018-05-13 LAB — TROPONIN I: Troponin I: <0.03 ng/mL (ref <0.03)

## 2018-05-13 MED ORDER — CEPHALEXIN 500 MG PO CAPS: 500.0000 mg | ORAL_CAPSULE | Freq: Three times a day (TID) | ORAL | 0 refills | Status: DC

## 2018-05-13 MED ORDER — SODIUM CHLORIDE 0.9 % IV SOLN
1.0000 g | Freq: Once | INTRAVENOUS | Status: AC
  Administered 2018-05-13: 1 g via INTRAVENOUS
  Filled 2018-05-13: qty 10

## 2018-05-13 MED ORDER — SODIUM CHLORIDE 0.9 % IV BOLUS
500.0000 mL | Freq: Once | INTRAVENOUS | Status: AC
  Administered 2018-05-13: 500 mL via INTRAVENOUS

## 2018-05-13 NOTE — Discharge Instructions (Signed)
Please seek medical attention for any high fevers, chest pain, shortness of breath, change in behavior, persistent vomiting, bloody stool or any other new or concerning symptoms.  

## 2018-05-13 NOTE — ED Triage Notes (Signed)
Pt brought in via ems from [facility].  Pt has had approx 3 falls today.  Pt alert.  md with pt.

## 2018-05-13 NOTE — ED Provider Notes (Signed)
[facility] Emergency Department Provider Note    ____________________________________________   I have reviewed the triage vital signs and the nursing notes.   HISTORY  Chief Complaint Fall   History limited by: Not Limited   HPI Virginia Barnes is a 72 y.o. female who presents to the emergency department today because of concerns for falls.  Patient states she had 3 falls today.  She does have a history of falls and states that is why she is at the living facility.  She did have physical therapy today and felt like she might of gotten too tired from that.  With her last fall she fell back and hit her head.  She is having some discomfort to her head.  She denies any recent illness.  Denies any shortness of breath or chest pain.  Denies any nausea vomiting.  No vision change.  Per medical record review patient has a history of DM, arrythmia, ed visit in the past for fall.   Past Medical History:  Diagnosis Date  . Arrhythmia    atrial fibrillation  . CHF (congestive heart failure) (HCC)   . Diabetes mellitus without complication (HCC)   . Diverticulitis   . Hypertension     Patient Active Problem List   Diagnosis Date Noted  . Chronic diastolic heart failure (HCC) 02/17/2018  . HTN (hypertension) 02/17/2018  . Atrial fibrillation (HCC) 02/17/2018  . Lymphedema 02/17/2018  . Diabetes (HCC) 02/17/2018  . Sepsis (HCC) 01/30/2018  . Hypoglycemia 01/12/2018  . Knee pain 05/17/2017    Past Surgical History:  Procedure Laterality Date  . ABDOMINAL HYSTERECTOMY    . CARDIAC CATHETERIZATION Left 04/13/2016   Procedure: Left Heart Cath and Coronary Angiography;  Surgeon: Laurier Nancy, MD;  Location: [facility] INVASIVE CV LAB;  Service: Cardiovascular;  Laterality: Left;  . JOINT REPLACEMENT      Prior to Admission medications   Medication Sig Start Date End Date Taking? Authorizing Provider  ACCU-CHEK AVIVA PLUS test strip  12/26/16   [provider]  ASPIRIN LOW DOSE 81 MG EC tablet Take 1 tablet by mouth daily. 09/26/15   [provider]  ASSURE COMFORT LANCETS 30G MISC  12/26/16   [provider]  Blood Glucose Calibration (ACCU-CHEK AVIVA) SOLN  12/26/16   [provider]  Calcium Carbonate-Vitamin D3 (CALCIUM 600/VITAMIN D) 600-400 MG-UNIT TABS Take 1 tablet by mouth 2 (two) times daily.    [provider]  cetirizine (ZYRTEC) 10 MG tablet Take 10 mg by mouth every evening.    [provider]  clotrimazole (LOTRIMIN) 1 % cream Apply 1 application topically 2 (two) times daily.    [provider]  ferrous sulfate 325 (65 FE) MG EC tablet Take 325 mg by mouth daily.    [provider]  fesoterodine (TOVIAZ) 8 MG TB24 tablet Take 8 mg by mouth daily.    [provider]  fluticasone (FLONASE) 50 MCG/ACT nasal spray Place 2 sprays into both nostrils daily.    [provider]  furosemide (LASIX) 40 MG tablet Take 1 tablet (40 mg total) by mouth daily. 02/06/18 02/06/19  Salary, Evelena Asa, MD  hydrALAZINE (APRESOLINE) 25 MG tablet Take 25 mg by mouth 2 (two) times daily.    [provider]  insulin aspart (NOVOLOG) 100 UNIT/ML injection 1-150 - no coverage.  151-200 - 2 units 201-250 4 units 251-300 - 6 units 301-350 - 8 units 351-400 - 10 units and call MD.  Patient taking differently: Inject 0-10 Units into the skin 3 (three) times daily with meals.  01/13/18   Houston SirenSainani, Vivek J, MD  ipratropium (ATROVENT) 0.03 % nasal spray Place 2 sprays into both nostrils 3 (three) times daily as needed for rhinitis.    [provider]  nystatin (MYCOSTATIN/NYSTOP) powder Apply topically 2 (two) times daily. Apply topically beneath bilateral breasts BID    [provider]  omeprazole (PRILOSEC) 40 MG capsule Take 1 capsule by mouth daily. 09/26/15   [provider]  potassium chloride (K-DUR) 10 MEQ tablet Take 10 mEq by mouth daily.      [provider]  rosuvastatin (CRESTOR) 20 MG tablet Take 20 mg by mouth daily.    [provider]  sertraline (ZOLOFT) 100 MG tablet Take 100 mg by mouth daily.    [provider]  sotalol (BETAPACE) 80 MG tablet 80 mg 2 (two) times daily.     [provider]  spironolactone (ALDACTONE) 50 MG tablet Take 50 mg by mouth daily.    [provider]  traMADol (ULTRAM) 50 MG tablet Take 1 tablet (50 mg total) by mouth 2 (two) times daily as needed. 02/06/18 02/06/19  Salary, Evelena AsaMontell D, MD    Allergies Peanuts [peanut oil]; Lasix [furosemide]; and Lisinopril  Family History  Problem Relation Age of Onset  . Stomach cancer Sister 3125  . Vaginal cancer Sister 7530  . Heart disease Father   . Breast cancer Neg Hx     Social History Social History   Tobacco Use  . Smoking status: Former Smoker    Packs/day: 0.50    Years: 15.00    Pack years: 7.50    Types: Cigarettes  . Smokeless tobacco: Never Used  Substance Use Topics  . Alcohol use: No  . Drug use: No    Review of Systems Constitutional: No fever/chills Eyes: No visual changes. ENT: No sore throat. Cardiovascular: Denies chest pain. Respiratory: Denies shortness of breath. Gastrointestinal: No abdominal pain.  No nausea, no vomiting.  No diarrhea.   Genitourinary: Negative for dysuria. Musculoskeletal: Negative for back pain. Skin: Negative for rash. Neurological: Positive for headache.   ____________________________________________   PHYSICAL EXAM:  VITAL SIGNS: ED Triage Vitals  Enc Vitals Group     BP --      Pulse Rate 05/13/18 1659 75     Resp 05/13/18 1659 18     Temp 05/13/18 1701 98.7 F (37.1 C)     Temp Source 05/13/18 1701 Oral     SpO2 05/13/18 1659 95 %     Weight 05/13/18 1658 200 lb (90.7 kg)     Height 05/13/18 1658 5\' 9"  (1.753 m)     Head Circumference --      Peak Flow --      Pain Score 05/13/18 1657 0   Constitutional: Alert and oriented.  Eyes:  Conjunctivae are normal.  ENT      Head: Normocephalic and atraumatic.      Nose: No congestion/rhinnorhea.      Mouth/Throat: Mucous membranes are moist.      Neck: No stridor. Hematological/Lymphatic/Immunilogical: No cervical lymphadenopathy. Cardiovascular: Normal rate, regular rhythm.  No murmurs, rubs, or gallops.  Respiratory: Normal respiratory effort without tachypnea nor retractions. Breath sounds are clear and equal bilaterally. No wheezes/rales/rhonchi. Gastrointestinal: Soft and non tender. No rebound. No guarding.  Genitourinary: Deferred Musculoskeletal: Normal range of motion in all extremities. No lower extremity edema. Neurologic:  Normal speech and language.  No gross focal neurologic deficits are appreciated.  Skin:  Skin is warm, dry and intact. No rash noted. Psychiatric: Mood and affect are normal. Speech and behavior are normal. Patient exhibits appropriate insight and judgment.  ____________________________________________    LABS (pertinent positives/negatives)  UA clear, small urine, small leukocytes, wbc 21-50, many bacteria CBC wbc 9.5, hgb 12.8, plt 204 BMP na 136, k 5.3, cl 98, glu 152 Trop <0.03 ____________________________________________   EKG  I, Phineas Semen, attending physician, personally viewed and interpreted this EKG  EKG Time: 1757 Rate: 77 Rhythm: normal sinus rhythm Axis: normal Intervals: qtc 433 QRS: narrow, q waves III ST changes: no st elevation Impression: abnormal ekg   ____________________________________________    RADIOLOGY  CT head No acute intracranial abnormality  Lumbar spine No acute abnormality  ____________________________________________   PROCEDURES  Procedures  ____________________________________________   INITIAL IMPRESSION / ASSESSMENT AND PLAN / ED COURSE  Pertinent labs & imaging results that were available during my care of the patient were reviewed by me and considered in my  medical decision making (see chart for details).   Patient presented to the emergency department today after numerous falls today.  Patient does have a history of falls.  Complaining primarily of low back pain.  CT head and lumbar spine negative for any acute fracture or intracranial bleed.  Did perform a work-up which is concerning for possible urinary tract infection.  I do think this likely is explaining the patient's increased falls.  No signs of anemia or leukocytosis on the blood work.  Troponin negative.  Patient will be given dose of antibiotics here in the emergency department and discharged with further antibiotics.  Gust findings and plans with patient.   ____________________________________________   FINAL CLINICAL IMPRESSION(S) / ED DIAGNOSES  Final diagnoses:  Lower urinary tract infectious disease  Fall, initial encounter     Note: This dictation was prepared with Office manager. Any transcriptional errors that result from this process are unintentional     Phineas Semen, MD 05/13/18 2013

## 2018-05-13 NOTE — ED Notes (Signed)
Patient transported to CT 

## 2018-05-15 LAB — URINE CULTURE: Culture: NO GROWTH

## 2018-05-17 ENCOUNTER — Emergency Department
Admission: EM | Admit: 2018-05-17 | Discharge: 2018-05-17 | Disposition: A | Discharge status: Home / Self Care | Payer: Medicare HMO | Attending: Emergency Medicine | Admitting: Emergency Medicine

## 2018-05-17 ENCOUNTER — Other Ambulatory Visit: Payer: Self-pay

## 2018-05-17 ENCOUNTER — Encounter: Payer: Self-pay | Admitting: Emergency Medicine

## 2018-05-17 DIAGNOSIS — Z7982 Long term (current) use of aspirin: Secondary | ICD-10-CM | POA: Diagnosis not present

## 2018-05-17 DIAGNOSIS — Z79899 Other long term (current) drug therapy: Secondary | ICD-10-CM | POA: Diagnosis not present

## 2018-05-17 DIAGNOSIS — E119 Type 2 diabetes mellitus without complications: Secondary | ICD-10-CM | POA: Insufficient documentation

## 2018-05-17 DIAGNOSIS — Y92009 Unspecified place in unspecified non-institutional (private) residence as the place of occurrence of the external cause: Secondary | ICD-10-CM

## 2018-05-17 DIAGNOSIS — Y92122 Bedroom in nursing home as the place of occurrence of the external cause: Secondary | ICD-10-CM | POA: Diagnosis not present

## 2018-05-17 DIAGNOSIS — I110 Hypertensive heart disease with heart failure: Secondary | ICD-10-CM | POA: Diagnosis not present

## 2018-05-17 DIAGNOSIS — W19XXXA Unspecified fall, initial encounter: Secondary | ICD-10-CM

## 2018-05-17 DIAGNOSIS — S0990XA Unspecified injury of head, initial encounter: Secondary | ICD-10-CM | POA: Insufficient documentation

## 2018-05-17 DIAGNOSIS — W0110XA Fall on same level from slipping, tripping and stumbling with subsequent striking against unspecified object, initial encounter: Secondary | ICD-10-CM | POA: Diagnosis not present

## 2018-05-17 DIAGNOSIS — Z87891 Personal history of nicotine dependence: Secondary | ICD-10-CM | POA: Insufficient documentation

## 2018-05-17 DIAGNOSIS — Y998 Other external cause status: Secondary | ICD-10-CM | POA: Insufficient documentation

## 2018-05-17 DIAGNOSIS — I5032 Chronic diastolic (congestive) heart failure: Secondary | ICD-10-CM | POA: Diagnosis not present

## 2018-05-17 DIAGNOSIS — Y9389 Activity, other specified: Secondary | ICD-10-CM | POA: Insufficient documentation

## 2018-05-17 DIAGNOSIS — Z794 Long term (current) use of insulin: Secondary | ICD-10-CM | POA: Diagnosis not present

## 2018-05-17 HISTORY — DX: Repeated falls: R29.6

## 2018-05-17 LAB — COMPREHENSIVE METABOLIC PANEL
ALBUMIN: 3.3 g/dL — AB (ref 3.5–5.0)
ALT: 24 U/L (ref 14–54)
ANION GAP: 12 (ref 5–15)
AST: 27 U/L (ref 15–41)
Alkaline Phosphatase: 69 U/L (ref 38–126)
BUN: 19 mg/dL (ref 6–20)
CO2: 25 mmol/L (ref 22–32)
Calcium: 9 mg/dL (ref 8.9–10.3)
Chloride: 103 mmol/L (ref 101–111)
Creatinine, Ser: 1.09 mg/dL — ABNORMAL HIGH (ref 0.44–1.00)
GFR calc Af Amer: 57 mL/min — ABNORMAL LOW (ref >60)
GFR calc non Af Amer: 49 mL/min — ABNORMAL LOW (ref >60)
GLUCOSE: 182 mg/dL — AB (ref 65–99)
POTASSIUM: 3.9 mmol/L (ref 3.5–5.1)
SODIUM: 140 mmol/L (ref 135–145)
TOTAL PROTEIN: 7.2 g/dL (ref 6.5–8.1)
Total Bilirubin: 0.5 mg/dL (ref 0.3–1.2)

## 2018-05-17 LAB — CBC WITH DIFFERENTIAL/PLATELET
BASOS PCT: 1 %
Basophils Absolute: 0 10*3/uL (ref 0–0.1)
Eosinophils Absolute: 0.1 10*3/uL (ref 0–0.7)
Eosinophils Relative: 1 %
HEMATOCRIT: 39.7 % (ref 35.0–47.0)
Hemoglobin: 12.8 g/dL (ref 12.0–16.0)
LYMPHS ABS: 1.2 10*3/uL (ref 1.0–3.6)
Lymphocytes Relative: 17 %
MCH: 28.2 pg (ref 26.0–34.0)
MCHC: 32.2 g/dL (ref 32.0–36.0)
MCV: 87.5 fL (ref 80.0–100.0)
MONOS PCT: 11 %
Monocytes Absolute: 0.8 10*3/uL (ref 0.2–0.9)
NEUTROS ABS: 4.7 10*3/uL (ref 1.4–6.5)
NEUTROS PCT: 70 %
Platelets: 246 10*3/uL (ref 150–440)
RBC: 4.53 MIL/uL (ref 3.80–5.20)
RDW: 15.6 % — ABNORMAL HIGH (ref 11.5–14.5)
WBC: 6.7 10*3/uL (ref 3.6–11.0)

## 2018-05-17 LAB — TROPONIN I: Troponin I: <0.03 ng/mL (ref <0.03)

## 2018-05-17 LAB — GLUCOSE, CAPILLARY: Glucose-Capillary: 189 mg/dL — ABNORMAL HIGH (ref 65–99)

## 2018-05-17 NOTE — Discharge Instructions (Addendum)
I have given you the head injury instructions just in case.  Please watch for any problems.  If you have anything called the ambulance and come right back.  I do not expect you to have any problems you just had a little bump on the head with no loss of consciousness.  These follow-up with your regular doctor and the neurologist I have included their contact information in this paperwork.  You can call their office Monday morning and schedule a follow-up appointment.  It may take some time to get in there though.  Let them know that both your regular doctor and the ER doctor wanted you to follow-up with them.

## 2018-05-17 NOTE — ED Notes (Signed)
Pt to be discharged back to [facility]

## 2018-05-17 NOTE — ED Notes (Signed)
Discharge paperwork sent with ems to [facility].

## 2018-05-17 NOTE — ED Provider Notes (Addendum)
[facility] Emergency Department Provider Note   ____________________________________________   First MD Initiated Contact with Patient 05/17/18 1903     (approximate)  I have reviewed the triage vital signs and the nursing notes.   HISTORY  Chief Complaint Fall    HPI Virginia Barnes is a 72 y.o. female patient reports she is been falling for about 6 months.  Today she fell twice.  She was straightening the covers on her bed and just fell over backwards.  She denies any dizziness just that she lost her balance.  She has a walker.  That did not help.  She sees Dr. Yves Dill.  She has told Dr. Welton Flakes about Dr. Welton Flakes is planning on scheduling her appointment with neurology.  Patient did not injure herself.  She denies any dizziness she denies any weakness numbness or anything else.  Past Medical History:  Diagnosis Date  . Arrhythmia    atrial fibrillation  . CHF (congestive heart failure) (HCC)   . Diabetes mellitus without complication (HCC)   . Diverticulitis   . Hypertension     Patient Active Problem List   Diagnosis Date Noted  . Chronic diastolic heart failure (HCC) 02/17/2018  . HTN (hypertension) 02/17/2018  . Atrial fibrillation (HCC) 02/17/2018  . Lymphedema 02/17/2018  . Diabetes (HCC) 02/17/2018  . Sepsis (HCC) 01/30/2018  . Hypoglycemia 01/12/2018  . Knee pain 05/17/2017    Past Surgical History:  Procedure Laterality Date  . ABDOMINAL HYSTERECTOMY    . CARDIAC CATHETERIZATION Left 04/13/2016   Procedure: Left Heart Cath and Coronary Angiography;  Surgeon: Laurier Nancy, MD;  Location: [facility] INVASIVE CV LAB;  Service: Cardiovascular;  Laterality: Left;  . JOINT REPLACEMENT      Prior to Admission medications   Medication Sig Start Date End Date Taking? Authorizing Provider  ASPIRIN LOW DOSE 81 MG EC tablet Take 1 tablet by mouth daily. 09/26/15  Yes [provider]  Calcium Carbonate-Vitamin D3 (CALCIUM 600/VITAMIN D)  600-400 MG-UNIT TABS Take 1 tablet by mouth 2 (two) times daily.   Yes [provider]  cephALEXin (KEFLEX) 500 MG capsule Take 1 capsule (500 mg total) by mouth 3 (three) times daily. 05/13/18  Yes Phineas Semen, MD  cetirizine (ZYRTEC) 10 MG tablet Take 10 mg by mouth at bedtime.    Yes [provider]  clotrimazole (LOTRIMIN) 1 % cream Apply 1 application topically 2 (two) times daily. 05/11/18 05/24/18 Yes [provider]  ferrous sulfate 325 (65 FE) MG EC tablet Take 325 mg by mouth daily.   Yes [provider]  fesoterodine (TOVIAZ) 8 MG TB24 tablet Take 8 mg by mouth daily.   Yes [provider]  fluorometholone (FML) 0.1 % ophthalmic suspension Place 1 drop into the right eye 2 (two) times daily.   Yes [provider]  fluticasone (FLONASE) 50 MCG/ACT nasal spray Place 2 sprays into both nostrils daily.   Yes [provider]  furosemide (LASIX) 40 MG tablet Take 1 tablet (40 mg total) by mouth daily. 02/06/18 02/06/19 Yes Salary, Jetty Duhamel D, MD  glimepiride (AMARYL) 2 MG tablet Take 2 mg by mouth 2 (two) times daily.   Yes [provider]  hydrALAZINE (APRESOLINE) 25 MG tablet Take 25 mg by mouth 2 (two) times daily.   Yes [provider]  insulin aspart (NOVOLOG) 100 UNIT/ML injection 1-150 - no coverage.  151-200 - 2 units 201-250 4 units 251-300 - 6 units 301-350 - 8  units 351-400 - 10 units and call MD. 01/13/18  Yes Houston Siren, MD  ipratropium (ATROVENT) 0.03 % nasal spray Place 2 sprays into both nostrils 3 (three) times daily as needed for rhinitis.   Yes [provider]  nystatin (MYCOSTATIN/NYSTOP) powder Apply to affected areas under breasts twice daily 05/11/18 05/24/18 Yes [provider]  omeprazole (PRILOSEC) 40 MG capsule Take 1 capsule by mouth daily. 09/26/15  Yes [provider]  potassium chloride (K-DUR) 10 MEQ tablet Take 10 mEq by mouth daily.    Yes [provider]  prednisoLONE acetate (PRED FORTE) 1 % ophthalmic suspension Place 1 drop into the right eye 2 (two) times daily.   Yes [provider]  rosuvastatin (CRESTOR) 20 MG tablet Take 20 mg by mouth daily.   Yes [provider]  sertraline (ZOLOFT) 100 MG tablet Take 100 mg by mouth daily.   Yes [provider]  sotalol AF (BETAPACE AF) 80 MG TABS tablet Take 80 mg by mouth 2 (two) times daily.   Yes [provider]  spironolactone (ALDACTONE) 50 MG tablet Take 50 mg by mouth daily.   Yes [provider]  traMADol (ULTRAM) 50 MG tablet Take 1 tablet (50 mg total) by mouth 2 (two) times daily as needed. 02/06/18 02/06/19 Yes Salary, Evelena Asa, MD  valACYclovir (VALTREX) 1000 MG tablet Take 1,000 mg by mouth daily.   Yes [provider]    Allergies Peanuts [peanut oil]; Lasix [furosemide]; and Lisinopril  Family History  Problem Relation Age of Onset  . Stomach cancer Sister 22  . Vaginal cancer Sister 77  . Heart disease Father   . Breast cancer Neg Hx     Social History Social History   Tobacco Use  . Smoking status: Former Smoker    Packs/day: 0.50    Years: 15.00    Pack years: 7.50    Types: Cigarettes  . Smokeless tobacco: Never Used  Substance Use Topics  . Alcohol use: No  . Drug use: No    Review of Systems  Constitutional: No fever/chills Eyes: No visual changes. ENT: No sore throat. Cardiovascular: Denies chest pain. Respiratory: Denies shortness of breath. Gastrointestinal: No abdominal pain.  No nausea, no vomiting.  No diarrhea.  No constipation. Genitourinary: Negative for dysuria. Musculoskeletal: Negative for back pain. Skin: Negative for rash. Neurological: Negative for headaches, focal weakness   ____________________________________________   PHYSICAL EXAM:  VITAL SIGNS: ED Triage Vitals  Enc Vitals Group     BP 05/17/18 1906 133/69     Pulse Rate 05/17/18 1906 72     Resp 05/17/18  1906 16     Temp 05/17/18 1906 98.1 F (36.7 C)     Temp src --      SpO2 05/17/18 1906 96 %     Weight 05/17/18 1909 200 lb (90.7 kg)     Height 05/17/18 1909 5\' 9"  (1.753 m)     Head Circumference --      Peak Flow --      Pain Score 05/17/18 1909 4     Pain Loc --      Pain Edu? --      Excl. in GC? --     Constitutional: Alert and oriented. Well appearing and in no acute distress. Eyes: Conjunctivae are normal. PERRL. EOMI. Head: Atraumatic. Nose: No congestion/rhinnorhea. Mouth/Throat: Mucous membranes are moist.  Oropharynx non-erythematous. Neck: No stridor.  Cardiovascular: Normal rate, regular rhythm. Grossly normal heart  sounds.  Good peripheral circulation. Respiratory: Normal respiratory effort.  No retractions. Lungs CTAB. Gastrointestinal: Soft and nontender. No distention. No abdominal bruits. No CVA tenderness. Musculoskeletal: No lower extremity tenderness trace edema. Neurologic:  Normal speech and language. No gross focal neurologic deficits are appreciated.  Cranial nerves II through XII are intact cerebellar finger-to-nose rapid alternating movements and hands are normal heel-to-shin is normal motor strength is 5 out of 5 throughout Skin:  Skin is warm, dry and intact. No rash noted. Psychiatric: Mood and affect are normal. Speech and behavior are normal.  ____________________________________________   LABS (all labs ordered are listed, but only abnormal results are displayed)  Labs Reviewed  COMPREHENSIVE METABOLIC PANEL - Abnormal; Notable for the following components:      Result Value   Glucose, Bld 182 (*)    Creatinine, Ser 1.09 (*)    Albumin 3.3 (*)    GFR calc non Af Amer 49 (*)    GFR calc Af Amer 57 (*)    All other components within normal limits  CBC WITH DIFFERENTIAL/PLATELET - Abnormal; Notable for the following components:   RDW 15.6 (*)    All other components within normal limits  GLUCOSE, CAPILLARY - Abnormal; Notable for the  following components:   Glucose-Capillary 189 (*)    All other components within normal limits  TROPONIN I  URINALYSIS, COMPLETE (UACMP) WITH MICROSCOPIC   ____________________________________________  EKG  EKG read and interpreted by me shows normal sinus rhythm rate of 76 normal axis no acute ST-T wave changes are seen ____________________________________________  RADIOLOGY  ED MD interpretation  Official radiology report(s): No results found.  ____________________________________________   PROCEDURES  Procedure(s) performed:   Procedures  Critical Care performed:   ____________________________________________   INITIAL IMPRESSION / ASSESSMENT AND PLAN / ED COURSE Patient does not want another head CT or an MRI.  She did not pass out she has absolutely no symptoms and only very small bump on the side of her head just above the ear.  We will comply with her request.  I told her we will have to observe her for some time and then I will give her head injury instructions to follow-up with.  Again she has no headache nausea vomiting loss of consciousness or any other complaints.  She does take aspirin though. ----------------------------------------- 9:15 PM on 05/17/2018 -----------------------------------------  Patient wants to go home she still feeling well with no symptoms I will allow her to do so.  She understands to return here for any further problems especially headache dizziness     ____________________________________________   FINAL CLINICAL IMPRESSION(S) / ED DIAGNOSES  Final diagnoses:  Fall in home, initial encounter  Injury of head, initial encounter     ED Discharge Orders    None       Note:  This document was prepared using Dragon voice recognition software and may include unintentional dictation errors.    Arnaldo NatalMalinda, Paul F, MD 05/17/18 2050    Arnaldo NatalMalinda, Paul F, MD 05/17/18 2115

## 2018-05-17 NOTE — ED Triage Notes (Signed)
Per [facility] pt has been falling recently. Fell 2x today, the latest while straightening the covers on her bed. Dx with uti and on meds x 2 days. Pt denies dizziness. Says "I just lost my balance'

## 2018-05-27 ENCOUNTER — Other Ambulatory Visit: Payer: Self-pay | Admitting: Internal Medicine

## 2018-05-27 DIAGNOSIS — Z1231 Encounter for screening mammogram for malignant neoplasm of breast: Secondary | ICD-10-CM

## 2018-05-31 ENCOUNTER — Emergency Department: Payer: Medicare HMO

## 2018-05-31 ENCOUNTER — Inpatient Hospital Stay
Admission: EM | Admit: 2018-05-31 | Discharge: 2018-06-04 | DRG: 176 | Disposition: A | Discharge status: Skilled Nursing Facility | Payer: Medicare HMO | Attending: Specialist | Admitting: Specialist

## 2018-05-31 ENCOUNTER — Other Ambulatory Visit: Payer: Self-pay

## 2018-05-31 DIAGNOSIS — I2692 Saddle embolus of pulmonary artery without acute cor pulmonale: Secondary | ICD-10-CM | POA: Diagnosis not present

## 2018-05-31 DIAGNOSIS — I2602 Saddle embolus of pulmonary artery with acute cor pulmonale: Secondary | ICD-10-CM

## 2018-05-31 DIAGNOSIS — Z90710 Acquired absence of both cervix and uterus: Secondary | ICD-10-CM

## 2018-05-31 DIAGNOSIS — Z87891 Personal history of nicotine dependence: Secondary | ICD-10-CM

## 2018-05-31 DIAGNOSIS — Z794 Long term (current) use of insulin: Secondary | ICD-10-CM

## 2018-05-31 DIAGNOSIS — R402142 Coma scale, eyes open, spontaneous, at arrival to emergency department: Secondary | ICD-10-CM | POA: Diagnosis present

## 2018-05-31 DIAGNOSIS — E785 Hyperlipidemia, unspecified: Secondary | ICD-10-CM | POA: Diagnosis present

## 2018-05-31 DIAGNOSIS — K219 Gastro-esophageal reflux disease without esophagitis: Secondary | ICD-10-CM | POA: Diagnosis present

## 2018-05-31 DIAGNOSIS — Z79899 Other long term (current) drug therapy: Secondary | ICD-10-CM

## 2018-05-31 DIAGNOSIS — I82431 Acute embolism and thrombosis of right popliteal vein: Secondary | ICD-10-CM | POA: Diagnosis present

## 2018-05-31 DIAGNOSIS — I248 Other forms of acute ischemic heart disease: Secondary | ICD-10-CM | POA: Diagnosis present

## 2018-05-31 DIAGNOSIS — Z8249 Family history of ischemic heart disease and other diseases of the circulatory system: Secondary | ICD-10-CM

## 2018-05-31 DIAGNOSIS — Y92129 Unspecified place in nursing home as the place of occurrence of the external cause: Secondary | ICD-10-CM

## 2018-05-31 DIAGNOSIS — R296 Repeated falls: Secondary | ICD-10-CM | POA: Diagnosis present

## 2018-05-31 DIAGNOSIS — R402362 Coma scale, best motor response, obeys commands, at arrival to emergency department: Secondary | ICD-10-CM | POA: Diagnosis present

## 2018-05-31 DIAGNOSIS — Z7951 Long term (current) use of inhaled steroids: Secondary | ICD-10-CM

## 2018-05-31 DIAGNOSIS — Z7982 Long term (current) use of aspirin: Secondary | ICD-10-CM

## 2018-05-31 DIAGNOSIS — Z9181 History of falling: Secondary | ICD-10-CM

## 2018-05-31 DIAGNOSIS — I10 Essential (primary) hypertension: Secondary | ICD-10-CM | POA: Diagnosis present

## 2018-05-31 DIAGNOSIS — R402252 Coma scale, best verbal response, oriented, at arrival to emergency department: Secondary | ICD-10-CM | POA: Diagnosis present

## 2018-05-31 DIAGNOSIS — F329 Major depressive disorder, single episode, unspecified: Secondary | ICD-10-CM | POA: Diagnosis present

## 2018-05-31 DIAGNOSIS — I110 Hypertensive heart disease with heart failure: Secondary | ICD-10-CM | POA: Diagnosis present

## 2018-05-31 DIAGNOSIS — I82890 Acute embolism and thrombosis of other specified veins: Secondary | ICD-10-CM | POA: Diagnosis present

## 2018-05-31 DIAGNOSIS — E109 Type 1 diabetes mellitus without complications: Secondary | ICD-10-CM

## 2018-05-31 DIAGNOSIS — W19XXXA Unspecified fall, initial encounter: Secondary | ICD-10-CM | POA: Diagnosis present

## 2018-05-31 DIAGNOSIS — I2699 Other pulmonary embolism without acute cor pulmonale: Secondary | ICD-10-CM

## 2018-05-31 DIAGNOSIS — E119 Type 2 diabetes mellitus without complications: Secondary | ICD-10-CM | POA: Diagnosis present

## 2018-05-31 DIAGNOSIS — I480 Paroxysmal atrial fibrillation: Secondary | ICD-10-CM | POA: Diagnosis present

## 2018-05-31 DIAGNOSIS — I5032 Chronic diastolic (congestive) heart failure: Secondary | ICD-10-CM | POA: Diagnosis present

## 2018-05-31 LAB — PROTIME-INR
INR: 1.17
PROTHROMBIN TIME: 14.8 s (ref 11.4–15.2)

## 2018-05-31 LAB — CBC
HEMATOCRIT: 41.9 % (ref 35.0–47.0)
HEMOGLOBIN: 13.4 g/dL (ref 12.0–16.0)
MCH: 27.9 pg (ref 26.0–34.0)
MCHC: 32 g/dL (ref 32.0–36.0)
MCV: 87.2 fL (ref 80.0–100.0)
Platelets: 240 10*3/uL (ref 150–440)
RBC: 4.81 MIL/uL (ref 3.80–5.20)
RDW: 15.9 % — ABNORMAL HIGH (ref 11.5–14.5)
WBC: 12.1 10*3/uL — ABNORMAL HIGH (ref 3.6–11.0)

## 2018-05-31 LAB — URINALYSIS, COMPLETE (UACMP) WITH MICROSCOPIC
BILIRUBIN URINE: NEGATIVE
Bacteria, UA: NONE SEEN
Glucose, UA: NEGATIVE mg/dL
Hgb urine dipstick: NEGATIVE
KETONES UR: NEGATIVE mg/dL
LEUKOCYTES UA: NEGATIVE
Nitrite: NEGATIVE
Protein, ur: NEGATIVE mg/dL
SPECIFIC GRAVITY, URINE: 1.018 (ref 1.005–1.030)
pH: 7 (ref 5.0–8.0)

## 2018-05-31 LAB — APTT: aPTT: 27 seconds (ref 24–36)

## 2018-05-31 LAB — BASIC METABOLIC PANEL
ANION GAP: 9 (ref 5–15)
BUN: 20 mg/dL (ref 8–23)
CALCIUM: 9.7 mg/dL (ref 8.9–10.3)
CHLORIDE: 103 mmol/L (ref 98–111)
CO2: 26 mmol/L (ref 22–32)
Creatinine, Ser: 1.18 mg/dL — ABNORMAL HIGH (ref 0.44–1.00)
GFR calc non Af Amer: 45 mL/min — ABNORMAL LOW (ref >60)
GFR, EST AFRICAN AMERICAN: 52 mL/min — AB (ref >60)
GLUCOSE: 165 mg/dL — AB (ref 70–99)
POTASSIUM: 4.7 mmol/L (ref 3.5–5.1)
Sodium: 138 mmol/L (ref 135–145)

## 2018-05-31 LAB — TROPONIN I: TROPONIN I: 0.31 ng/mL — AB (ref <0.03)

## 2018-05-31 MED ORDER — HEPARIN BOLUS VIA INFUSION
5000.0000 [IU] | Freq: Once | INTRAVENOUS | Status: AC
  Administered 2018-06-01: 5000 [IU] via INTRAVENOUS
  Filled 2018-05-31: qty 5000

## 2018-05-31 MED ORDER — HEPARIN (PORCINE) IN NACL 100-0.45 UNIT/ML-% IJ SOLN
1500.0000 [IU]/h | INTRAMUSCULAR | Status: DC
  Administered 2018-06-01: 1500 [IU]/h via INTRAVENOUS
  Filled 2018-05-31: qty 250

## 2018-05-31 MED ORDER — ASPIRIN 81 MG PO CHEW
324.0000 mg | CHEWABLE_TABLET | Freq: Once | ORAL | Status: AC
  Administered 2018-05-31: 324 mg via ORAL
  Filled 2018-05-31: qty 4

## 2018-05-31 MED ORDER — ONDANSETRON HCL 4 MG/2ML IJ SOLN
4.0000 mg | Freq: Once | INTRAMUSCULAR | Status: AC
  Administered 2018-06-01: 4 mg via INTRAVENOUS
  Filled 2018-05-31: qty 2

## 2018-05-31 MED ORDER — IOHEXOL 350 MG/ML SOLN
75.0000 mL | Freq: Once | INTRAVENOUS | Status: AC | PRN
  Administered 2018-05-31: 75 mL via INTRAVENOUS

## 2018-05-31 MED ORDER — ACETAMINOPHEN 500 MG PO TABS
1000.0000 mg | ORAL_TABLET | Freq: Once | ORAL | Status: AC
  Administered 2018-05-31: 1000 mg via ORAL
  Filled 2018-05-31: qty 2

## 2018-05-31 MED ORDER — FENTANYL CITRATE (PF) 100 MCG/2ML IJ SOLN
75.0000 ug | Freq: Once | INTRAMUSCULAR | Status: AC
  Administered 2018-06-01: 75 ug via INTRAVENOUS
  Filled 2018-05-31: qty 2

## 2018-05-31 MED ORDER — SODIUM CHLORIDE 0.9 % IV BOLUS
1000.0000 mL | Freq: Once | INTRAVENOUS | Status: AC
  Administered 2018-06-01: 1000 mL via INTRAVENOUS

## 2018-05-31 NOTE — Progress Notes (Signed)
ANTICOAGULATION CONSULT NOTE - Initial Consult  Pharmacy Consult for Heparin  Indication: pulmonary embolus  Allergies  Allergen Reactions  . Peanuts [Peanut Oil] Shortness Of Breath  . Lasix [Furosemide] Swelling    Tongue swelling  . Lisinopril Swelling    Tongue swelling    Patient Measurements: Height: 5\' 9"  (175.3 cm) Weight: 193 lb (87.5 kg) IBW/kg (Calculated) : 66.2 Heparin Dosing Weight: 84.2 kg   Vital Signs: Temp: 98.4 F (36.9 C) (06/29 2123) Temp Source: Oral (06/29 2123) BP: 121/59 (06/29 2321) Pulse Rate: 89 (06/29 2321)  Labs: Recent Labs    05/31/18 2140 05/31/18 2337  HGB 13.4  --   HCT 41.9  --   PLT 240  --   APTT  --  27  LABPROT  --  14.8  INR  --  1.17  CREATININE 1.18*  --   TROPONINI 0.31*  --     Estimated Creatinine Clearance: 50.8 mL/min (A) (by C-G formula based on SCr of 1.18 mg/dL (H)).   Medical History: Past Medical History:  Diagnosis Date  . Arrhythmia    atrial fibrillation  . CHF (congestive heart failure) (HCC)   . Diabetes mellitus without complication (HCC)   . Diverticulitis   . Frequent falls   . Hypertension     Medications:   (Not in a hospital admission)  Assessment: Pharmacy consulted to dose heparin in this 72 year old female admitted with PE.  No prior anticoag noted ,  CrCl = 50.8 ml/min  Goal of Therapy:  Heparin level 0.3-0.7 units/ml Monitor platelets by anticoagulation protocol: Yes   Plan:  Give 5000 units bolus x 1 Start heparin infusion at 1500 units/hr Check anti-Xa level in 8 hours and daily while on heparin Continue to monitor H&H and platelets  Raford Brissett D 05/31/2018,11:55 PM

## 2018-05-31 NOTE — ED Triage Notes (Signed)
Pt arrives to ED via [facility] from [facility] with c/o RUQ abdominal pain r/t a fall on that side 2 days ago. Facility also reports foul smelling urine with concerns for a UTI.

## 2018-05-31 NOTE — ED Provider Notes (Signed)
[facility] Emergency Department Provider Note  ____________________________________________  Time seen: Approximately 10:57 PM  I have reviewed the triage vital signs and the nursing notes.   HISTORY  Chief Complaint Abdominal Injury; Fall; and Urinary Tract Infection   HPI Virginia Barnes is a 72 y.o. female with a history of atrial fibrillation not on anticoagulation, CHF, diabetes, diverticulitis, hypertension, dementia, and frequent falls who presents for evaluation of right chest pain.  Patient reports that the pain started 2 days ago after she had a fall at [place] [facility].  The pain is sharp, located in the right upper chest, worse with inspiration and associated with shortness of breath.  Patient denies head trauma or LOC.  She denies abdominal pain, nausea, vomiting.  She denies cough or fever.  She denies neck pain or back pain.   Past Medical History:  Diagnosis Date  . Arrhythmia    atrial fibrillation  . CHF (congestive heart failure) (HCC)   . Diabetes mellitus without complication (HCC)   . Diverticulitis   . Frequent falls   . Hypertension     Patient Active Problem List   Diagnosis Date Noted  . Chronic diastolic heart failure (HCC) 02/17/2018  . HTN (hypertension) 02/17/2018  . Atrial fibrillation (HCC) 02/17/2018  . Lymphedema 02/17/2018  . Diabetes (HCC) 02/17/2018  . Sepsis (HCC) 01/30/2018  . Hypoglycemia 01/12/2018  . Knee pain 05/17/2017    Past Surgical History:  Procedure Laterality Date  . ABDOMINAL HYSTERECTOMY    . CARDIAC CATHETERIZATION Left 04/13/2016   Procedure: Left Heart Cath and Coronary Angiography;  Surgeon: Laurier Nancy, MD;  Location: [facility] INVASIVE CV LAB;  Service: Cardiovascular;  Laterality: Left;  . JOINT REPLACEMENT      Prior to Admission medications   Medication Sig Start Date End Date Taking? Authorizing Provider  ASPIRIN LOW DOSE 81 MG EC tablet Take 1 tablet by mouth daily. 09/26/15    [provider]  Calcium Carbonate-Vitamin D3 (CALCIUM 600/VITAMIN D) 600-400 MG-UNIT TABS Take 1 tablet by mouth 2 (two) times daily.    [provider]  cephALEXin (KEFLEX) 500 MG capsule Take 1 capsule (500 mg total) by mouth 3 (three) times daily. 05/13/18   Phineas Semen, MD  cetirizine (ZYRTEC) 10 MG tablet Take 10 mg by mouth at bedtime.     [provider]  ferrous sulfate 325 (65 FE) MG EC tablet Take 325 mg by mouth daily.    [provider]  fesoterodine (TOVIAZ) 8 MG TB24 tablet Take 8 mg by mouth daily.    [provider]  fluorometholone (FML) 0.1 % ophthalmic suspension Place 1 drop into the right eye 2 (two) times daily.    [provider]  fluticasone (FLONASE) 50 MCG/ACT nasal spray Place 2 sprays into both nostrils daily.    [provider]  furosemide (LASIX) 40 MG tablet Take 1 tablet (40 mg total) by mouth daily. 02/06/18 02/06/19  Salary, Evelena Asa, MD  glimepiride (AMARYL) 2 MG tablet Take 2 mg by mouth 2 (two) times daily.    [provider]  hydrALAZINE (APRESOLINE) 25 MG tablet Take 25 mg by mouth 2 (two) times daily.    [provider]  insulin aspart (NOVOLOG) 100 UNIT/ML injection 1-150 - no coverage.  151-200 - 2 units 201-250 4 units 251-300 - 6 units 301-350 - 8 units 351-400 - 10 units and call MD. 01/13/18   Houston Siren, MD  ipratropium (ATROVENT) 0.03 %  nasal spray Place 2 sprays into both nostrils 3 (three) times daily as needed for rhinitis.    [provider]  omeprazole (PRILOSEC) 40 MG capsule Take 1 capsule by mouth daily. 09/26/15   [provider]  potassium chloride (K-DUR) 10 MEQ tablet Take 10 mEq by mouth daily.     [provider]  prednisoLONE acetate (PRED FORTE) 1 % ophthalmic suspension Place 1 drop into the right eye 2 (two) times daily.    [provider]  rosuvastatin (CRESTOR) 20 MG tablet Take 20 mg by mouth daily.     [provider]  sertraline (ZOLOFT) 100 MG tablet Take 100 mg by mouth daily.    [provider]  sotalol AF (BETAPACE AF) 80 MG TABS tablet Take 80 mg by mouth 2 (two) times daily.    [provider]  spironolactone (ALDACTONE) 50 MG tablet Take 50 mg by mouth daily.    [provider]  traMADol (ULTRAM) 50 MG tablet Take 1 tablet (50 mg total) by mouth 2 (two) times daily as needed. 02/06/18 02/06/19  Salary, Evelena AsaMontell D, MD  valACYclovir (VALTREX) 1000 MG tablet Take 1,000 mg by mouth daily.    [provider]    Allergies Peanuts [peanut oil]; Lasix [furosemide]; and Lisinopril  Family History  Problem Relation Age of Onset  . Stomach cancer Sister 4125  . Vaginal cancer Sister 2230  . Heart disease Father   . Breast cancer Neg Hx     Social History Social History   Tobacco Use  . Smoking status: Former Smoker    Packs/day: 0.50    Years: 15.00    Pack years: 7.50    Types: Cigarettes  . Smokeless tobacco: Never Used  Substance Use Topics  . Alcohol use: No  . Drug use: No    Review of Systems Constitutional: Negative for fever. Eyes: Negative for visual changes. ENT: Negative for facial injury or neck injury Cardiovascular: Negative for chest injury. Respiratory: Negative for shortness of breath. + Right chest wall pain Gastrointestinal: Negative for abdominal pain or injury. Genitourinary: Negative for dysuria. Musculoskeletal: Negative for back injury, negative for arm or leg pain. Skin: Negative for laceration/abrasions. Neurological: Negative for head injury.  ____________________________________________   PHYSICAL EXAM:  VITAL SIGNS: ED Triage Vitals  Enc Vitals Group     BP 05/31/18 2123 (!) 141/71     Pulse Rate 05/31/18 2123 93     Resp 05/31/18 2123 18     Temp 05/31/18 2123 98.4 F (36.9 C)     Temp Source 05/31/18 2123 Oral     SpO2 05/31/18 2123 94 %     Weight 05/31/18 2125 193 lb (87.5 kg)     Height  05/31/18 2125 5\' 9"  (1.753 m)     Head Circumference --      Peak Flow --      Pain Score 05/31/18 2125 8     Pain Loc --      Pain Edu? --      Excl. in GC? --     Constitutional: Alert and oriented x 2. No acute distress. Does not appear intoxicated. HEENT Head: Normocephalic and atraumatic. Face: No facial bony tenderness. Stable midface Ears: No hemotympanum bilaterally. No Battle sign Eyes: No eye injury. PERRL. No raccoon eyes Nose: Nontender. No epistaxis. No rhinorrhea Mouth/Throat: Mucous membranes are moist. No oropharyngeal blood. No dental injury. Airway patent without stridor. Normal voice. Neck: no C-collar in place. No midline  c-spine tenderness.  Cardiovascular: Normal rate, regular rhythm. Normal and symmetric distal pulses are present in all extremities. Pulmonary/Chest: Chest wall is stable, tender to palpation on the R upper lateral chest wall with no bruising. Normal respiratory effort. Breath sounds are normal. No crepitus.  Abdominal: Soft, nontender, non distended. Musculoskeletal: Nontender with normal full range of motion in all extremities. No deformities. No thoracic or lumbar midline spinal tenderness. Pelvis is stable. Skin: Skin is warm, dry and intact. Bruise on the R knee Psychiatric: Speech and behavior are appropriate. Neurological: Normal speech and language. Moves all extremities to command. No gross focal neurologic deficits are appreciated.  Glascow Coma Score: 4 - Opens eyes on own 6 - Follows simple motor commands 5 - Alert and oriented GCS: 15  ____________________________________________   LABS (all labs ordered are listed, but only abnormal results are displayed)  Labs Reviewed  BASIC METABOLIC PANEL - Abnormal; Notable for the following components:      Result Value   Glucose, Bld 165 (*)    Creatinine, Ser 1.18 (*)    GFR calc non Af Amer 45 (*)    GFR calc Af Amer 52 (*)    All other components within normal limits  CBC -  Abnormal; Notable for the following components:   WBC 12.1 (*)    RDW 15.9 (*)    All other components within normal limits  TROPONIN I - Abnormal; Notable for the following components:   Troponin I 0.31 (*)    All other components within normal limits  URINALYSIS, COMPLETE (UACMP) WITH MICROSCOPIC - Abnormal; Notable for the following components:   Color, Urine YELLOW (*)    APPearance CLEAR (*)    All other components within normal limits  PROTIME-INR  APTT   ____________________________________________  EKG  ED ECG REPORT I, Nita Sickle, the attending physician, personally viewed and interpreted this ECG.  Normal sinus rhythm, rate of 92, normal intervals, normal axis, no ST elevations or depressions, S1Q3T3.  Unchanged from prior from 2 weeks ago  22:18 -normal sinus rhythm, rate of 90, normal intervals, normal axis, no ST elevations or depressions.  Unchanged from initial EKG ____________________________________________  RADIOLOGY  I have personally reviewed the images performed during this visit and I agree with the Radiologist's read.   Interpretation by Radiologist:  Dg Ribs Unilateral W/chest Right  Result Date: 05/31/2018 CLINICAL DATA:  Right chest wall pain after fall 2 days ago. EXAM: RIGHT RIBS AND CHEST - 3+ VIEW COMPARISON:  Chest x-ray dated February 02, 2018. FINDINGS: No fracture or other bone lesions are seen involving the ribs. Stable cardiomegaly. Mild pulmonary vascular congestion. Atherosclerotic calcification of the aortic arch. Left lower lobe atelectasis/scar. No focal consolidation, pleural effusion, or pneumothorax. IMPRESSION: 1. No rib fracture. 2. Mild pulmonary vascular congestion. Electronically Signed   By: Obie Dredge M.D.   On: 05/31/2018 22:39   Ct Angio Chest Pe W And/or Wo Contrast  Result Date: 05/31/2018 CLINICAL DATA:  Right-sided pleuritic chest pain and shortness of breath. EXAM: CT ANGIOGRAPHY CHEST WITH CONTRAST TECHNIQUE:  Multidetector CT imaging of the chest was performed using the standard protocol during bolus administration of intravenous contrast. Multiplanar CT image reconstructions and MIPs were obtained to evaluate the vascular anatomy. CONTRAST:  75mL OMNIPAQUE IOHEXOL 350 MG/ML SOLN COMPARISON:  CT chest dated November 21, 2015. FINDINGS: Cardiovascular: Central saddle pulmonary embolus with lobar pulmonary emboli involving all lobes of both lungs. Segmental pulmonary emboli in both lower lobes and the right  middle lobe. Elevated RV/LV ratio, measuring 1.6. Prominent dilatation of the right atrium with reflux of contrast into the intrahepatic veins. No pericardial effusion. Normal caliber thoracic aorta. Coronary, aortic arch, and branch vessel atherosclerotic vascular disease. Mediastinum/Nodes: No enlarged mediastinal, hilar, or axillary lymph nodes. Thyroid gland, trachea, and esophagus demonstrate no significant findings. Small hiatal hernia. Lungs/Pleura: Wedge-shaped ground-glass density in the peripheral right lower lobe may reflect early pulmonary infarct. Bilateral lower lobe subsegmental atelectasis. No focal consolidation, pleural effusion, or pneumothorax. No suspicious pulmonary nodule. Upper Abdomen: No acute abnormality. Slightly enlarged simple right renal cyst measuring 4.7 cm. Musculoskeletal: No chest wall abnormality. No acute or significant osseous findings. Review of the MIP images confirms the above findings. IMPRESSION: 1. Acute central saddle pulmonary embolus with lobar pulmonary emboli involving all lobes of both lungs. CT evidence of right heart strain (RV/LV Ratio = 1.6) consistent with at least submassive (intermediate risk) PE. The presence of right heart strain has been associated with an increased risk of morbidity and mortality. Please activate Code PE by paging [phone]. 2. Wedge-shaped ground-glass density in the peripheral right lower lobe may reflect early pulmonary infarct. 3.   Aortic atherosclerosis (ICD10-I70.0). Critical Value/emergent results were called by telephone at the time of interpretation on 05/31/2018 at 11:33 pm to Dr. Nita Sickle , who verbally acknowledged these results. Electronically Signed   By: Obie Dredge M.D.   On: 05/31/2018 23:35    ____________________________________________   PROCEDURES  Procedure(s) performed:no Procedures Critical Care performed: yes  CRITICAL CARE Performed by: Nita Sickle  ?  Total critical care time: 40 min  Critical care time was exclusive of separately billable procedures and treating other patients.  Critical care was necessary to treat or prevent imminent or life-threatening deterioration.  Critical care was time spent personally by me on the following activities: development of treatment plan with patient and/or surrogate as well as nursing, discussions with consultants, evaluation of patient's response to treatment, examination of patient, obtaining history from patient or surrogate, ordering and performing treatments and interventions, ordering and review of laboratory studies, ordering and review of radiographic studies, pulse oximetry and re-evaluation of patient's condition.  ____________________________________________   INITIAL IMPRESSION / ASSESSMENT AND PLAN / ED COURSE   72 y.o. female with a history of atrial fibrillation not on anticoagulation, CHF, diabetes, diverticulitis, hypertension, dementia, and frequent falls who presents for evaluation of right chest pain since falling 2 days.  Patient has no obvious trauma on exam however pain was reproducible on palpation initially making me concerned for rib fracture vs MSK pain.  Patient's labs showed a troponin of 0.31.  2 serial EKGs with no evidence of ischemia.  At that time chest x-ray had already been done and was negative for rib fracture so I was concerned the patient had a pulmonary embolism.  CT angiogram was done which  confirms a saddle embolism with evidence of right heart strain. Patient is HD stable.  Patient will be started on heparin and is going to be admitted to the hospitalist service. Spoke with Dr. Wyn Quaker about intra-arterial tPA, he recommended keeping patient on heparin and he will evaluate patient in the am but non emergent since patient is HD stable, with no O2 requirement. If patient becomes unstable or develops abnormal vital signs to consult him again for possible emergent intra-arterial TPA.  I discussed these recommendations with Dr. Anne Hahn who is admitting patient.      As part of my medical decision making, I reviewed the  following data within the electronic MEDICAL RECORD NUMBER Nursing notes reviewed and incorporated, Labs reviewed , EKG interpreted , Old EKG reviewed, Old chart reviewed, Radiograph reviewed , Discussed with admitting physician , Notes from prior ED visits and [place] Controlled Substance Database    Pertinent labs & imaging results that were available during my care of the patient were reviewed by me and considered in my medical decision making (see chart for details).    ____________________________________________   FINAL CLINICAL IMPRESSION(S) / ED DIAGNOSES  Final diagnoses:  Acute saddle pulmonary embolism with acute cor pulmonale (HCC)      NEW MEDICATIONS STARTED DURING THIS VISIT:  ED Discharge Orders    None       Note:  This document was prepared using Dragon voice recognition software and may include unintentional dictation errors.    Don Perking, [place], MD 06/01/18 [phone]

## 2018-05-31 NOTE — ED Notes (Signed)
Dr Don PerkingVeronese made aware of pt's elevated Troponin level as reported by lab at this time= Troponin 0.31 ng/mL

## 2018-06-01 ENCOUNTER — Inpatient Hospital Stay: Payer: Medicare HMO

## 2018-06-01 ENCOUNTER — Inpatient Hospital Stay (HOSPITAL_COMMUNITY)
Admit: 2018-06-01 | Discharge: 2018-06-01 | Disposition: A | Discharge status: Home / Self Care | Payer: Medicare HMO | Attending: Internal Medicine | Admitting: Internal Medicine

## 2018-06-01 DIAGNOSIS — I82431 Acute embolism and thrombosis of right popliteal vein: Secondary | ICD-10-CM | POA: Diagnosis present

## 2018-06-01 DIAGNOSIS — I248 Other forms of acute ischemic heart disease: Secondary | ICD-10-CM | POA: Diagnosis present

## 2018-06-01 DIAGNOSIS — Z7982 Long term (current) use of aspirin: Secondary | ICD-10-CM | POA: Diagnosis not present

## 2018-06-01 DIAGNOSIS — I82890 Acute embolism and thrombosis of other specified veins: Secondary | ICD-10-CM | POA: Diagnosis present

## 2018-06-01 DIAGNOSIS — F329 Major depressive disorder, single episode, unspecified: Secondary | ICD-10-CM | POA: Diagnosis present

## 2018-06-01 DIAGNOSIS — Z7951 Long term (current) use of inhaled steroids: Secondary | ICD-10-CM | POA: Diagnosis not present

## 2018-06-01 DIAGNOSIS — I361 Nonrheumatic tricuspid (valve) insufficiency: Secondary | ICD-10-CM

## 2018-06-01 DIAGNOSIS — R402252 Coma scale, best verbal response, oriented, at arrival to emergency department: Secondary | ICD-10-CM | POA: Diagnosis present

## 2018-06-01 DIAGNOSIS — Y92129 Unspecified place in nursing home as the place of occurrence of the external cause: Secondary | ICD-10-CM | POA: Diagnosis not present

## 2018-06-01 DIAGNOSIS — Z87891 Personal history of nicotine dependence: Secondary | ICD-10-CM | POA: Diagnosis not present

## 2018-06-01 DIAGNOSIS — R0602 Shortness of breath: Secondary | ICD-10-CM | POA: Diagnosis not present

## 2018-06-01 DIAGNOSIS — R079 Chest pain, unspecified: Secondary | ICD-10-CM

## 2018-06-01 DIAGNOSIS — I2699 Other pulmonary embolism without acute cor pulmonale: Secondary | ICD-10-CM

## 2018-06-01 DIAGNOSIS — E119 Type 2 diabetes mellitus without complications: Secondary | ICD-10-CM | POA: Diagnosis present

## 2018-06-01 DIAGNOSIS — R296 Repeated falls: Secondary | ICD-10-CM | POA: Diagnosis present

## 2018-06-01 DIAGNOSIS — E785 Hyperlipidemia, unspecified: Secondary | ICD-10-CM | POA: Diagnosis present

## 2018-06-01 DIAGNOSIS — R402142 Coma scale, eyes open, spontaneous, at arrival to emergency department: Secondary | ICD-10-CM | POA: Diagnosis present

## 2018-06-01 DIAGNOSIS — Z90710 Acquired absence of both cervix and uterus: Secondary | ICD-10-CM | POA: Diagnosis not present

## 2018-06-01 DIAGNOSIS — Z794 Long term (current) use of insulin: Secondary | ICD-10-CM | POA: Diagnosis not present

## 2018-06-01 DIAGNOSIS — I2692 Saddle embolus of pulmonary artery without acute cor pulmonale: Secondary | ICD-10-CM | POA: Diagnosis present

## 2018-06-01 DIAGNOSIS — Z9181 History of falling: Secondary | ICD-10-CM | POA: Diagnosis not present

## 2018-06-01 DIAGNOSIS — I480 Paroxysmal atrial fibrillation: Secondary | ICD-10-CM | POA: Diagnosis present

## 2018-06-01 DIAGNOSIS — Z8249 Family history of ischemic heart disease and other diseases of the circulatory system: Secondary | ICD-10-CM | POA: Diagnosis not present

## 2018-06-01 DIAGNOSIS — I5032 Chronic diastolic (congestive) heart failure: Secondary | ICD-10-CM | POA: Diagnosis present

## 2018-06-01 DIAGNOSIS — K219 Gastro-esophageal reflux disease without esophagitis: Secondary | ICD-10-CM | POA: Diagnosis present

## 2018-06-01 DIAGNOSIS — W19XXXA Unspecified fall, initial encounter: Secondary | ICD-10-CM | POA: Diagnosis present

## 2018-06-01 DIAGNOSIS — R402362 Coma scale, best motor response, obeys commands, at arrival to emergency department: Secondary | ICD-10-CM | POA: Diagnosis present

## 2018-06-01 DIAGNOSIS — I110 Hypertensive heart disease with heart failure: Secondary | ICD-10-CM | POA: Diagnosis present

## 2018-06-01 DIAGNOSIS — Z79899 Other long term (current) drug therapy: Secondary | ICD-10-CM | POA: Diagnosis not present

## 2018-06-01 LAB — GLUCOSE, CAPILLARY
GLUCOSE-CAPILLARY: 130 mg/dL — AB (ref 70–99)
GLUCOSE-CAPILLARY: 212 mg/dL — AB (ref 70–99)
Glucose-Capillary: 116 mg/dL — ABNORMAL HIGH (ref 70–99)

## 2018-06-01 LAB — BASIC METABOLIC PANEL
Anion gap: 7 (ref 5–15)
BUN: 18 mg/dL (ref 8–23)
CALCIUM: 8.7 mg/dL — AB (ref 8.9–10.3)
CHLORIDE: 106 mmol/L (ref 98–111)
CO2: 22 mmol/L (ref 22–32)
CREATININE: 1.09 mg/dL — AB (ref 0.44–1.00)
GFR calc Af Amer: 57 mL/min — ABNORMAL LOW (ref >60)
GFR calc non Af Amer: 49 mL/min — ABNORMAL LOW (ref >60)
GLUCOSE: 180 mg/dL — AB (ref 70–99)
Potassium: 4.8 mmol/L (ref 3.5–5.1)
Sodium: 135 mmol/L (ref 135–145)

## 2018-06-01 LAB — TROPONIN I
TROPONIN I: 0.77 ng/mL — AB (ref <0.03)
Troponin I: 0.67 ng/mL (ref <0.03)
Troponin I: 0.61 ng/mL (ref <0.03)

## 2018-06-01 LAB — ECHOCARDIOGRAM COMPLETE
HEIGHTINCHES: 67 in
Weight: 3072 oz

## 2018-06-01 LAB — CBC
HEMATOCRIT: 38.9 % (ref 35.0–47.0)
Hemoglobin: 12.4 g/dL (ref 12.0–16.0)
MCH: 27.9 pg (ref 26.0–34.0)
MCHC: 31.9 g/dL — ABNORMAL LOW (ref 32.0–36.0)
MCV: 87.3 fL (ref 80.0–100.0)
Platelets: 229 10*3/uL (ref 150–440)
RBC: 4.46 MIL/uL (ref 3.80–5.20)
RDW: 16 % — ABNORMAL HIGH (ref 11.5–14.5)
WBC: 14.3 10*3/uL — AB (ref 3.6–11.0)

## 2018-06-01 LAB — HEPARIN LEVEL (UNFRACTIONATED)
Heparin Unfractionated: 1.41 IU/mL — ABNORMAL HIGH (ref 0.30–0.70)
Heparin Unfractionated: 0.89 IU/mL — ABNORMAL HIGH (ref 0.30–0.70)

## 2018-06-01 LAB — MRSA PCR SCREENING: MRSA by PCR: NEGATIVE

## 2018-06-01 MED ORDER — ACETAMINOPHEN 325 MG PO TABS
650.0000 mg | ORAL_TABLET | Freq: Four times a day (QID) | ORAL | Status: DC | PRN
  Filled 2018-06-01: qty 2

## 2018-06-01 MED ORDER — ROSUVASTATIN CALCIUM 20 MG PO TABS
20.0000 mg | ORAL_TABLET | Freq: Every day | ORAL | Status: DC
  Administered 2018-06-01 – 2018-06-04 (×4): 20 mg via ORAL
  Filled 2018-06-01 (×4): qty 2
  Filled 2018-06-01 (×4): qty 1

## 2018-06-01 MED ORDER — INSULIN ASPART 100 UNIT/ML ~~LOC~~ SOLN
0.0000 [IU] | Freq: Four times a day (QID) | SUBCUTANEOUS | Status: DC
  Administered 2018-06-01: 1 [IU] via SUBCUTANEOUS
  Filled 2018-06-01: qty 1

## 2018-06-01 MED ORDER — SODIUM CHLORIDE 0.9% FLUSH
3.0000 mL | Freq: Two times a day (BID) | INTRAVENOUS | Status: DC
  Administered 2018-06-01 – 2018-06-04 (×6): 3 mL via INTRAVENOUS

## 2018-06-01 MED ORDER — PREDNISOLONE ACETATE 1 % OP SUSP
1.0000 [drp] | Freq: Two times a day (BID) | OPHTHALMIC | Status: DC
  Administered 2018-06-01 – 2018-06-04 (×7): 1 [drp] via OPHTHALMIC
  Filled 2018-06-01 (×2): qty 1

## 2018-06-01 MED ORDER — HEPARIN (PORCINE) IN NACL 100-0.45 UNIT/ML-% IJ SOLN
1200.0000 [IU]/h | INTRAMUSCULAR | Status: AC
  Administered 2018-06-01 (×2): 1350 [IU]/h via INTRAVENOUS
  Filled 2018-06-01: qty 250

## 2018-06-01 MED ORDER — INSULIN ASPART 100 UNIT/ML ~~LOC~~ SOLN
0.0000 [IU] | Freq: Three times a day (TID) | SUBCUTANEOUS | Status: DC
  Administered 2018-06-01: 3 [IU] via SUBCUTANEOUS
  Administered 2018-06-02 (×3): 2 [IU] via SUBCUTANEOUS
  Administered 2018-06-02 – 2018-06-03 (×3): 1 [IU] via SUBCUTANEOUS
  Filled 2018-06-01 (×7): qty 1

## 2018-06-01 MED ORDER — OXYCODONE HCL 5 MG PO TABS
5.0000 mg | ORAL_TABLET | ORAL | Status: DC | PRN
  Administered 2018-06-01 (×3): 5 mg via ORAL
  Filled 2018-06-01 (×3): qty 1

## 2018-06-01 MED ORDER — ONDANSETRON HCL 4 MG PO TABS
4.0000 mg | ORAL_TABLET | Freq: Four times a day (QID) | ORAL | Status: DC | PRN
Start: 2018-06-01 — End: 2018-06-04

## 2018-06-01 MED ORDER — SOTALOL HCL 80 MG PO TABS
80.0000 mg | ORAL_TABLET | Freq: Two times a day (BID) | ORAL | Status: DC
  Administered 2018-06-01 – 2018-06-04 (×7): 80 mg via ORAL
  Filled 2018-06-01 (×9): qty 1

## 2018-06-01 MED ORDER — SERTRALINE HCL 50 MG PO TABS
100.0000 mg | ORAL_TABLET | Freq: Every day | ORAL | Status: DC
  Administered 2018-06-01 – 2018-06-04 (×4): 100 mg via ORAL
  Filled 2018-06-01 (×4): qty 2

## 2018-06-01 MED ORDER — ACETAMINOPHEN 650 MG RE SUPP: 650.0000 mg | Freq: Four times a day (QID) | RECTAL | Status: DC | PRN

## 2018-06-01 MED ORDER — ONDANSETRON HCL 4 MG/2ML IJ SOLN: 4.0000 mg | Freq: Four times a day (QID) | INTRAMUSCULAR | Status: DC | PRN

## 2018-06-01 MED ORDER — PANTOPRAZOLE SODIUM 40 MG PO TBEC
40.0000 mg | DELAYED_RELEASE_TABLET | Freq: Every day | ORAL | Status: DC
  Administered 2018-06-01 – 2018-06-04 (×4): 40 mg via ORAL
  Filled 2018-06-01 (×4): qty 1

## 2018-06-01 NOTE — Consult Note (Signed)
[facility] Vascular Consult Note  MRN : [id number]  Virginia Barnes is a 72 y.o. ([birth year]) female who presents with chief complaint of  Chief Complaint  Patient presents with  . Abdominal Injury  . Fall  . Urinary Tract Infection  .  History of Present Illness: I am asked to see the patient by Dr. Don Perking from the ER regarding a large PE.  Patient is admitted with fall and urinary tract infection and has noticed some right-sided chest pain and some shortness of breath with exertion.  As part of her work-up she underwent a CT scan of the chest which I have independently reviewed.  This does demonstrate a reasonably large bilateral pulmonary emboli with evidence of right heart strain by CT criteria.  The patient had not had significant hypoxia but she was found to have an elevated troponin.  She is seen this morning getting her echocardiogram.  She says her breathing is better.  She had no hypoxia overnight.  She does not appear tachypneic or labored in her respirations.  Her echocardiogram also demonstrates evidence of some right heart strain.  She has been started on anticoagulation without any signs of bleeding.  She has had no recent surgery or major trauma.  Current Facility-Administered Medications  Medication Dose Route Frequency Provider Last Rate Last Dose  . acetaminophen (TYLENOL) tablet 650 mg  650 mg Oral Q6H PRN Oralia Manis, MD       Or  . acetaminophen (TYLENOL) suppository 650 mg  650 mg Rectal Q6H PRN Oralia Manis, MD      . heparin ADULT infusion 100 units/mL (25000 units/245mL sodium chloride 0.45%)  1,500 Units/hr Intravenous Continuous Oralia Manis, MD 15 mL/hr at 06/01/18 0040 1,500 Units/hr at 06/01/18 0040  . insulin aspart (novoLOG) injection 0-9 Units  0-9 Units Subcutaneous Q6H Oralia Manis, MD   1 Units at 06/01/18 [phone]  . ondansetron (ZOFRAN) tablet 4 mg  4 mg Oral Q6H PRN Oralia Manis, MD       Or  . ondansetron [facility]) injection  4 mg  4 mg Intravenous Q6H PRN Oralia Manis, MD      . oxyCODONE (Oxy IR/ROXICODONE) immediate release tablet 5 mg  5 mg Oral Q4H PRN Oralia Manis, MD   5 mg at 06/01/18 0200  . pantoprazole (PROTONIX) EC tablet 40 mg  40 mg Oral Daily Oralia Manis, MD      . prednisoLONE acetate (PRED FORTE) 1 % ophthalmic suspension 1 drop  1 drop Right Eye BID Oralia Manis, MD      . rosuvastatin (CRESTOR) tablet 20 mg  20 mg Oral Daily Oralia Manis, MD      . sertraline (ZOLOFT) tablet 100 mg  100 mg Oral Daily Oralia Manis, MD      . sotalol (BETAPACE) tablet 80 mg  80 mg Oral BID Oralia Manis, MD        Past Medical History:  Diagnosis Date  . Arrhythmia    atrial fibrillation  . CHF (congestive heart failure) (HCC)   . Diabetes mellitus without complication (HCC)   . Diverticulitis   . Frequent falls   . Hypertension     Past Surgical History:  Procedure Laterality Date  . ABDOMINAL HYSTERECTOMY    . CARDIAC CATHETERIZATION Left 04/13/2016   Procedure: Left Heart Cath and Coronary Angiography;  Surgeon: Laurier Nancy, MD;  Location: [facility] INVASIVE CV LAB;  Service: Cardiovascular;  Laterality: Left;  . JOINT REPLACEMENT  Social History Social History   Tobacco Use  . Smoking status: Former Smoker    Packs/day: 0.50    Years: 15.00    Pack years: 7.50    Types: Cigarettes  . Smokeless tobacco: Never Used  Substance Use Topics  . Alcohol use: No  . Drug use: No    Family History Family History  Problem Relation Age of Onset  . Stomach cancer Sister 44  . Vaginal cancer Sister 30  . Heart disease Father   . Breast cancer Neg Hx     Allergies  Allergen Reactions  . Peanuts [Peanut Oil] Shortness Of Breath  . Lasix [Furosemide] Swelling    Tongue swelling  . Lisinopril Swelling    Tongue swelling     REVIEW OF SYSTEMS (Negative unless checked)  Constitutional: [] Weight loss  [] Fever  [] Chills Cardiac: [] Chest pain   [] Chest pressure   [x] Palpitations    [] Shortness of breath when laying flat   [] Shortness of breath at rest   [x] Shortness of breath with exertion. Vascular:  [] Pain in legs with walking   [] Pain in legs at rest   [] Pain in legs when laying flat   [] Claudication   [] Pain in feet when walking  [] Pain in feet at rest  [] Pain in feet when laying flat   [] History of DVT   [] Phlebitis   [] Swelling in legs   [] Varicose veins   [] Non-healing ulcers Pulmonary:   [] Uses home oxygen   [] Productive cough   [] Hemoptysis   [] Wheeze  [] COPD   [] Asthma Neurologic:  [] Dizziness  [] Blackouts   [] Seizures   [] History of stroke   [] History of TIA  [] Aphasia   [] Temporary blindness   [] Dysphagia   [] Weakness or numbness in arms   [] Weakness or numbness in legs Musculoskeletal:  [] Arthritis   [] Joint swelling   [] Joint pain   [] Low back pain Hematologic:  [] Easy bruising  [] Easy bleeding   [] Hypercoagulable state   [] Anemic  [] Hepatitis Gastrointestinal:  [] Blood in stool   [] Vomiting blood  [x] Gastroesophageal reflux/heartburn   [] Difficulty swallowing. Genitourinary:  [] Chronic kidney disease   [] Difficult urination  [x] Frequent urination  [] Burning with urination   [] Blood in urine Skin:  [] Rashes   [] Ulcers   [] Wounds Psychological:  [] History of anxiety   []  History of major depression.  Physical Examination  Vitals:   05/31/18 2321 06/01/18 0041 06/01/18 0149 06/01/18 0515  BP: (!) 121/59 125/63 (!) 120/52 (!) 98/56  Pulse: 89 86 91 (!) 101  Resp: 20 17 18 16   Temp:   97.8 F (36.6 C) 98 F (36.7 C)  TempSrc:   Oral Oral  SpO2: 96% 94% 98% 97%  Weight:   192 lb (87.1 kg)   Height:   5\' 7"  (1.702 m)    Body mass index is 30.07 kg/m. Gen:  WD/WN, NAD Head: [place]/AT, No temporalis wasting. Ear/Nose/Throat: Hearing grossly intact, nares w/o erythema or drainage, oropharynx w/o Erythema/Exudate Eyes: Sclera non-icteric, conjunctiva clear Neck: Trachea midline.  No JVD.  Pulmonary:  Good air movement, respirations not labored, equal  bilaterally.  Cardiac: Slightly tachycardic Vascular:  Vessel Right Left  Radial Palpable Palpable                                   Gastrointestinal: soft, non-tender/non-distended.  Musculoskeletal: M/S 5/5 throughout.  Extremities without ischemic changes.  No deformity or atrophy.  Mild lower extremity edema. Neurologic: Sensation grossly  intact in extremities.  Symmetrical.  Speech is fluent. Motor exam as listed above. Psychiatric: Judgment intact, Mood & affect appropriate for pt's clinical situation. Dermatologic: No rashes or ulcers noted.  No cellulitis or open wounds.       CBC Lab Results  Component Value Date   WBC 14.3 (H) 06/01/2018   HGB 12.4 06/01/2018   HCT 38.9 06/01/2018   MCV 87.3 06/01/2018   PLT 229 06/01/2018    BMET    Component Value Date/Time   NA 135 06/01/2018 0153   NA 143 05/11/2013 0039   K 4.8 06/01/2018 0153   K 3.3 (L) 05/11/2013 0039   CL 106 06/01/2018 0153   CL 110 (H) 05/11/2013 0039   CO2 22 06/01/2018 0153   CO2 27 05/11/2013 0039   GLUCOSE 180 (H) 06/01/2018 0153   GLUCOSE 103 (H) 05/11/2013 0039   BUN 18 06/01/2018 0153   BUN 14 05/11/2013 0039   CREATININE 1.09 (H) 06/01/2018 0153   CREATININE 0.98 05/11/2013 0039   CALCIUM 8.7 (L) 06/01/2018 0153   CALCIUM 8.9 05/11/2013 0039   GFRNONAA 49 (L) 06/01/2018 0153   GFRNONAA 60 (L) 05/11/2013 0039   GFRAA 57 (L) 06/01/2018 0153   GFRAA >60 05/11/2013 0039   Estimated Creatinine Clearance: 52.9 mL/min (A) (by C-G formula based on SCr of 1.09 mg/dL (H)).  COAG Lab Results  Component Value Date   INR 1.17 05/31/2018   INR 1.15 01/31/2018   INR 1.19 01/30/2018    Radiology Dg Ribs Unilateral W/chest Right  Result Date: 05/31/2018 CLINICAL DATA:  Right chest wall pain after fall 2 days ago. EXAM: RIGHT RIBS AND CHEST - 3+ VIEW COMPARISON:  Chest x-ray dated February 02, 2018. FINDINGS: No fracture or other bone lesions are seen involving the ribs. Stable  cardiomegaly. Mild pulmonary vascular congestion. Atherosclerotic calcification of the aortic arch. Left lower lobe atelectasis/scar. No focal consolidation, pleural effusion, or pneumothorax. IMPRESSION: 1. No rib fracture. 2. Mild pulmonary vascular congestion. Electronically Signed   By: Obie DredgeWilliam T Derry M.D.   On: 05/31/2018 22:39   Dg Lumbar Spine Complete  Result Date: 05/13/2018 CLINICAL DATA:  Low back pain, fell 3 times today EXAM: LUMBAR SPINE - COMPLETE 4+ VIEW COMPARISON:  10/24/2017 FINDINGS: Osseous demineralization. Five non-rib-bearing lumbar vertebra. Vertebral body heights maintained. Multilevel disc space narrowing and tiny endplate spurs. No fracture, subluxation or bone destruction. Vacuum phenomenon at L3-L4. Sacral neural stimulator noted. SI joints preserved. Atherosclerotic calcifications aorta. IMPRESSION: Osseous demineralization with scattered degenerative disc disease changes. No acute abnormalities. Electronically Signed   By: Ulyses SouthwardMark  Boles M.D.   On: 05/13/2018 18:03   Ct Head Wo Contrast  Result Date: 05/13/2018 CLINICAL DATA:  Larey SeatFell 3 times today, uncertain if struck head or experienced loss of consciousness, history CHF, diabetes mellitus, hypertension EXAM: CT HEAD WITHOUT CONTRAST TECHNIQUE: Contiguous axial images were obtained from the base of the skull through the vertex without intravenous contrast. Sagittal and coronal MPR images reconstructed from axial data set. COMPARISON:  04/11/2018 FINDINGS: Brain: Generalized atrophy. Normal ventricular morphology. No midline shift or mass effect. Small vessel chronic ischemic changes of deep cerebral white matter. No intracranial hemorrhage, mass lesion, evidence of acute infarction, or extra-axial fluid collection. Vascular: Atherosclerotic calcification within internal carotid arteries at skull base Skull: Intact Sinuses/Orbits: Visualized paranasal sinuses and mastoid air cells clear Other: N/A IMPRESSION: Atrophy with mild  small vessel chronic ischemic changes of deep cerebral white matter. No acute intracranial abnormalities. Electronically Signed  By: Ulyses Southward M.D.   On: 05/13/2018 18:10   Ct Angio Chest Pe W And/or Wo Contrast  Result Date: 05/31/2018 CLINICAL DATA:  Right-sided pleuritic chest pain and shortness of breath. EXAM: CT ANGIOGRAPHY CHEST WITH CONTRAST TECHNIQUE: Multidetector CT imaging of the chest was performed using the standard protocol during bolus administration of intravenous contrast. Multiplanar CT image reconstructions and MIPs were obtained to evaluate the vascular anatomy. CONTRAST:  75mL OMNIPAQUE IOHEXOL 350 MG/ML SOLN COMPARISON:  CT chest dated November 21, 2015. FINDINGS: Cardiovascular: Central saddle pulmonary embolus with lobar pulmonary emboli involving all lobes of both lungs. Segmental pulmonary emboli in both lower lobes and the right middle lobe. Elevated RV/LV ratio, measuring 1.6. Prominent dilatation of the right atrium with reflux of contrast into the intrahepatic veins. No pericardial effusion. Normal caliber thoracic aorta. Coronary, aortic arch, and branch vessel atherosclerotic vascular disease. Mediastinum/Nodes: No enlarged mediastinal, hilar, or axillary lymph nodes. Thyroid gland, trachea, and esophagus demonstrate no significant findings. Small hiatal hernia. Lungs/Pleura: Wedge-shaped ground-glass density in the peripheral right lower lobe may reflect early pulmonary infarct. Bilateral lower lobe subsegmental atelectasis. No focal consolidation, pleural effusion, or pneumothorax. No suspicious pulmonary nodule. Upper Abdomen: No acute abnormality. Slightly enlarged simple right renal cyst measuring 4.7 cm. Musculoskeletal: No chest wall abnormality. No acute or significant osseous findings. Review of the MIP images confirms the above findings. IMPRESSION: 1. Acute central saddle pulmonary embolus with lobar pulmonary emboli involving all lobes of both lungs. CT evidence of  right heart strain (RV/LV Ratio = 1.6) consistent with at least submassive (intermediate risk) PE. The presence of right heart strain has been associated with an increased risk of morbidity and mortality. Please activate Code PE by paging [phone]. 2. Wedge-shaped ground-glass density in the peripheral right lower lobe may reflect early pulmonary infarct. 3.  Aortic atherosclerosis (ICD10-I70.0). Critical Value/emergent results were called by telephone at the time of interpretation on 05/31/2018 at 11:33 pm to Dr. Nita Sickle , who verbally acknowledged these results. Electronically Signed   By: Obie Dredge M.D.   On: 05/31/2018 23:35      Assessment/Plan 1.  Large, bilateral pulmonary emboli.  The patient has been appropriately started on anticoagulation.  This is a very debatable issue and consideration for intra-arterial thrombolytic therapy and thrombectomy is certainly a reasonable option.  Given her right heart strain and her elevated troponin, clearly this is a sniffing and pulmonary emboli.  However, the patient really does not have any hypoxia or shortness of breath at this time.  I have discussed with the patient that it would be reasonable to perform the procedure or continue anticoagulation alone at this point.  I think the most prudent option would be to try to increase the patient's activity and see if she has any evidence of hypoxia or significant dyspnea with minimal exertion.  If she does, I would then consider invasive therapy in the form of intra-arterial thrombolytic therapy and thrombectomy.  She is not an immediate extremis, and this does not have to be done emergently.  This can be done anytime over the next couple of days.  If she does not, I think continuing conservative medical therapy with anticoagulation alone would be reasonable.  Will follow 2.  Diabetes. blood glucose control important in reducing the progression of atherosclerotic disease. Also, involved in wound  healing. On appropriate medications. 3.  HTN. Currently stable and blood pressure control important in reducing the progression of atherosclerotic disease. On appropriate oral medications.  Festus Barren, MD  06/01/2018 9:59 AM    This note was created with Dragon medical transcription system.  Any error is purely unintentional

## 2018-06-01 NOTE — Progress Notes (Signed)
[employer] [facility] - [place] [facility]   PATIENT NAME: Virginia Barnes    MR#:  [id number]  DATE OF BIRTH:  01-23-1946  SUBJECTIVE:   Pt. Here after a fall and noted to have saddle pulmonary embolism.  Patient clinically does not complain of any shortness of breath.  She does have some pleuritic chest pain on the right side.  No hemoptysis and no other acute events overnight.  REVIEW OF SYSTEMS:    Review of Systems  Constitutional: Negative for chills and fever.  HENT: Negative for congestion and tinnitus.   Eyes: Negative for blurred vision and double vision.  Respiratory: Negative for cough, shortness of breath and wheezing.   Cardiovascular: Positive for chest pain (Pleuritic). Negative for orthopnea and PND.  Gastrointestinal: Negative for abdominal pain, diarrhea, nausea and vomiting.  Genitourinary: Negative for dysuria and hematuria.  Neurological: Negative for dizziness, sensory change and focal weakness.  All other systems reviewed and are negative.   Nutrition: Carb control/heart Healthy Tolerating Diet: Yes Tolerating PT: Await Eval.    DRUG ALLERGIES:   Allergies  Allergen Reactions  . Peanuts [Peanut Oil] Shortness Of Breath  . Lasix [Furosemide] Swelling    Tongue swelling  . Lisinopril Swelling    Tongue swelling    VITALS:  Blood pressure (!) 98/56, pulse (!) 101, temperature 98 F (36.7 C), temperature source Oral, resp. rate 16, height 5\' 7"  (1.702 m), weight 87.1 kg (192 lb), SpO2 97 %.  PHYSICAL EXAMINATION:   Physical Exam  GENERAL:  72 y.o.-year-old patient lying in bed in no acute distress.  EYES: Pupils equal, round, reactive to light and accommodation. No scleral icterus. Extraocular muscles intact.  HEENT: Head atraumatic, normocephalic. Oropharynx and nasopharynx clear.  NECK:  Supple, no jugular venous distention. No thyroid enlargement, no tenderness.  LUNGS: Normal breath sounds bilaterally, no wheezing, rales, rhonchi. No  use of accessory muscles of respiration.  CARDIOVASCULAR: S1, S2 normal. No murmurs, rubs, or gallops.  ABDOMEN: Soft, nontender, nondistended. Bowel sounds present. No organomegaly or mass.  EXTREMITIES: No cyanosis, clubbing or edema b/l.    NEUROLOGIC: Cranial nerves II through XII are intact. No focal Motor or sensory deficits b/l.  Globally weak. PSYCHIATRIC: The patient is alert and oriented x 2.  SKIN: No obvious rash, lesion, or ulcer.    LABORATORY PANEL:   CBC Recent Labs  Lab 06/01/18 0153  WBC 14.3*  HGB 12.4  HCT 38.9  PLT 229   ------------------------------------------------------------------------------------------------------------------  Chemistries  Recent Labs  Lab 06/01/18 0153  NA 135  K 4.8  CL 106  CO2 22  GLUCOSE 180*  BUN 18  CREATININE 1.09*  CALCIUM 8.7*   ------------------------------------------------------------------------------------------------------------------  Cardiac Enzymes Recent Labs  Lab 06/01/18 0738  TROPONINI 0.67*   ------------------------------------------------------------------------------------------------------------------  RADIOLOGY:  Dg Ribs Unilateral W/chest Right  Result Date: 05/31/2018 CLINICAL DATA:  Right chest wall pain after fall 2 days ago. EXAM: RIGHT RIBS AND CHEST - 3+ VIEW COMPARISON:  Chest x-ray dated February 02, 2018. FINDINGS: No fracture or other bone lesions are seen involving the ribs. Stable cardiomegaly. Mild pulmonary vascular congestion. Atherosclerotic calcification of the aortic arch. Left lower lobe atelectasis/scar. No focal consolidation, pleural effusion, or pneumothorax. IMPRESSION: 1. No rib fracture. 2. Mild pulmonary vascular congestion. Electronically Signed   By: Obie Dredge M.D.   On: 05/31/2018 22:39   Ct Angio Chest Pe W And/or Wo Contrast  Result Date: 05/31/2018 CLINICAL DATA:  Right-sided pleuritic chest pain and shortness of  breath. EXAM: CT ANGIOGRAPHY CHEST WITH  CONTRAST TECHNIQUE: Multidetector CT imaging of the chest was performed using the standard protocol during bolus administration of intravenous contrast. Multiplanar CT image reconstructions and MIPs were obtained to evaluate the vascular anatomy. CONTRAST:  75mL OMNIPAQUE IOHEXOL 350 MG/ML SOLN COMPARISON:  CT chest dated November 21, 2015. FINDINGS: Cardiovascular: Central saddle pulmonary embolus with lobar pulmonary emboli involving all lobes of both lungs. Segmental pulmonary emboli in both lower lobes and the right middle lobe. Elevated RV/LV ratio, measuring 1.6. Prominent dilatation of the right atrium with reflux of contrast into the intrahepatic veins. No pericardial effusion. Normal caliber thoracic aorta. Coronary, aortic arch, and branch vessel atherosclerotic vascular disease. Mediastinum/Nodes: No enlarged mediastinal, hilar, or axillary lymph nodes. Thyroid gland, trachea, and esophagus demonstrate no significant findings. Small hiatal hernia. Lungs/Pleura: Wedge-shaped ground-glass density in the peripheral right lower lobe may reflect early pulmonary infarct. Bilateral lower lobe subsegmental atelectasis. No focal consolidation, pleural effusion, or pneumothorax. No suspicious pulmonary nodule. Upper Abdomen: No acute abnormality. Slightly enlarged simple right renal cyst measuring 4.7 cm. Musculoskeletal: No chest wall abnormality. No acute or significant osseous findings. Review of the MIP images confirms the above findings. IMPRESSION: 1. Acute central saddle pulmonary embolus with lobar pulmonary emboli involving all lobes of both lungs. CT evidence of right heart strain (RV/LV Ratio = 1.6) consistent with at least submassive (intermediate risk) PE. The presence of right heart strain has been associated with an increased risk of morbidity and mortality. Please activate Code PE by paging [phone]. 2. Wedge-shaped ground-glass density in the peripheral right lower lobe may reflect early  pulmonary infarct. 3.  Aortic atherosclerosis (ICD10-I70.0). Critical Value/emergent results were called by telephone at the time of interpretation on 05/31/2018 at 11:33 pm to Dr. Nita SickleAROLINA VERONESE , who verbally acknowledged these results. Electronically Signed   By: Obie DredgeWilliam T Derry M.D.   On: 05/31/2018 23:35     ASSESSMENT AND PLAN:   72 year old female with past medical history of diabetes, hypertension, history of frequent falls, CHF, atrial fibrillation who presented to the hospital after a fall and complaining of some right-sided pleuritic chest pain and underwent a CT chest which was positive for a submassive pulmonary embolism.  1.  Pulmonary embolism-this is a provoked pulmonary embolism given her poor mobility and frequent falls. - This is a cause of patient shortness of breath and pleuritic chest pain.  Continue IV heparin for now. -We will get Dopplers of lower extremities to rule out further clot burden.  Seen by vascular surgery and at this point they do not recommend thrombolytics as patient is not hypoxic and clinically hemodynamically stable. -Continue supportive care and will switch to oral anticoagulants in the next day or 2.  2.  Elevated troponin-likely in the setting of supply demand ischemia from underlying pulmonary embolism.  3.  History of chronic atrial fibrillation-rate controlled.  Continue sotalol.  Patient was not on anticoagulation given her high fall risk.  4.  GERD-continue Protonix.  5.  Diabetes-blood sugar stable.  Continue sliding scale insulin.  6.  Hyperlipidemia-continue Crestor.  7.  Depression-continue Zoloft.     All the records are reviewed and case discussed with Care Management/Social Worker. Management plans discussed with the patient, family and they are in agreement.  CODE STATUS: Full code  DVT Prophylaxis: Hep. gtt  TOTAL TIME TAKING CARE OF THIS PATIENT: 30 minutes.   POSSIBLE D/C IN 2-3 DAYS, DEPENDING ON CLINICAL  CONDITION.   Houston SirenSAINANI,VIVEK J M.D on 06/01/2018  at 1:58 PM  Between 7am to 6pm - Pager - [phone]  After 6pm go to [email or web address] - Social research officer, government  [facility] [place] [facility]  Office  [phone]  CC: Primary care physician; Margaretann Loveless, MD

## 2018-06-01 NOTE — Clinical Social Work Note (Signed)
Clinical Social Work Assessment  Patient Details  Name: Virginia Barnes MRN: [id number] Date of Birth: October 14, 1946  Date of referral:  06/01/18               Reason for consult:  Other (Comment Required)(From [facility])                Permission sought to share information with:  Family Supports Permission granted to share information::  Yes, Verbal Permission Granted  Name::     Son [name] [phone]  Agency::  [facility]  Relationship::     Contact Information:     Housing/Transportation Living arrangements for the past 2 months:  Assisted Living Facility Source of Information:  Patient Patient Interpreter Needed:  None Criminal Activity/Legal Involvement Pertinent to Current Situation/Hospitalization:    Significant Relationships:  Adult Children, Church Lives with:  Facility Resident Do you feel safe going back to the place where you live?  Yes Need for family participation in patient care:  Yes (Comment)  Care giving concerns: Patient will ask her son to call DSS to see if there is special funding available for cataract surgery co pay.   Social Worker assessment / plan: LCSW introduced myself to patient. Patient is a 72 year old female ( widowed) She was pleasant and calm and agreed to partake in her assessment. Patient has lived at [facility] since end of January of this year. She reports she has no issues there but does have to wait for help at times.  Patient uses a wheel chair and needs assistance with ADL's. She is incontinent and wears attends. She reports she is diabetic and that she has good hearing and speech but her vision is problematic and would like to have cataract surgery in the future.She reports she is widowed and her son provides good care for her. She will have son [name] call DSS to see if they can support her new surgery co pay fees. Resource list  has been provided to patient. LCSW was asked to dial her sons number for her and LCSW assisted  patient to speak to her son.  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 LCSW concluded assessment and offered refreshments. No further needs at this time.  Employment status:  Retired(Worked at the [employer] for 32 years) Insurance information:  Medicare([employer] Medicare/Medicaid) PT Recommendations:  Not assessed at this time Information / Referral to community resources:     Patient/Family's Response to care: Patient is aware of good care in hospital  Patient/Family's Understanding of and Emotional Response to Diagnosis, Current Treatment, and Prognosis: Patient reports she understands she isn't feeling well and hopes to get better soon.  Emotional Assessment Appearance:  Appears stated age Attitude/Demeanor/Rapport:  Gracious Affect (typically observed):  Accepting, Appropriate Orientation:  Oriented to Self, Oriented to Place, Oriented to  Time, Oriented to Situation Alcohol / Substance use:  Not Applicable Psych involvement (Current and /or in the community):  No (Comment)  Discharge Needs  Concerns to be addressed:  No discharge needs identified Readmission within the last 30 days:  No Current discharge risk:  None Barriers to Discharge:  Continued Medical Work up   BurnsBandi, West[place]laudine M, LCSW 06/01/2018, 2:44 PM

## 2018-06-01 NOTE — Progress Notes (Signed)
ANTICOAGULATION CONSULT NOTE   Pharmacy Consult for Heparin  Indication: pulmonary embolus  Allergies  Allergen Reactions  . Peanuts [Peanut Oil] Shortness Of Breath  . Lasix [Furosemide] Swelling    Tongue swelling  . Lisinopril Swelling    Tongue swelling    Patient Measurements: Height: 5\' 7"  (170.2 cm) Weight: 192 lb (87.1 kg) IBW/kg (Calculated) : 61.6 Heparin Dosing Weight: 84.2 kg   Vital Signs: Temp: 98.4 F (36.9 C) (06/30 1954) Temp Source: Oral (06/30 1954) BP: 133/57 (06/30 1954) Pulse Rate: 81 (06/30 1954)  Labs: Recent Labs    05/31/18 2140 05/31/18 2337 06/01/18 0153 06/01/18 0738 06/01/18 1019 06/01/18 1450 06/01/18 2139  HGB 13.4  --  12.4  --   --   --   --   HCT 41.9  --  38.9  --   --   --   --   PLT 240  --  229  --   --   --   --   APTT  --  27  --   --   --   --   --   LABPROT  --  14.8  --   --   --   --   --   INR  --  1.17  --   --   --   --   --   HEPARINUNFRC  --   --   --   --  1.41*  --  0.89*  CREATININE 1.18*  --  1.09*  --   --   --   --   TROPONINI 0.31*  --  0.77* 0.67*  --  0.61*  --     Estimated Creatinine Clearance: 52.9 mL/min (A) (by C-G formula based on SCr of 1.09 mg/dL (H)).   Medical History: Past Medical History:  Diagnosis Date  . Arrhythmia    atrial fibrillation  . CHF (congestive heart failure) (HCC)   . Diabetes mellitus without complication (HCC)   . Diverticulitis   . Frequent falls   . Hypertension     Medications:  Medications Prior to Admission  Medication Sig Dispense Refill Last Dose  . ASPIRIN LOW DOSE 81 MG EC tablet Take 1 tablet by mouth daily.   unknown at unknown  . Calcium Carbonate-Vitamin D3 (CALCIUM 600/VITAMIN D) 600-400 MG-UNIT TABS Take 1 tablet by mouth 2 (two) times daily.   unknown at unknown  . cetirizine (ZYRTEC) 10 MG tablet Take 10 mg by mouth at bedtime.    unknown at unknown  . ferrous sulfate 325 (65 FE) MG EC tablet Take 325 mg by mouth daily.   unknown at unknown  .  fesoterodine (TOVIAZ) 8 MG TB24 tablet Take 8 mg by mouth daily.   unknown at unknown  . fluorometholone (FML) 0.1 % ophthalmic suspension Place 1 drop into the right eye 2 (two) times daily.   unknown at unknown  . fluticasone (FLONASE) 50 MCG/ACT nasal spray Place 2 sprays into both nostrils daily.   unknown at unknown  . furosemide (LASIX) 40 MG tablet Take 1 tablet (40 mg total) by mouth daily. 30 tablet 1 unknown at unknown  . glimepiride (AMARYL) 2 MG tablet Take 2 mg by mouth 2 (two) times daily.   unknown at unknown  . hydrALAZINE (APRESOLINE) 25 MG tablet Take 25 mg by mouth 2 (two) times daily.   unknown at unknown  . ipratropium (ATROVENT) 0.03 % nasal spray Place 2 sprays into both nostrils 3 (three)  times daily as needed for rhinitis.   unknown at unknown  . NOVOLOG FLEXPEN 100 UNIT/ML FlexPen Inject 0-10 Units into the skin 3 (three) times daily with meals. <60 Call MD 151-200 - 2 units 201-250 4 units 251-300 - 6 units 301-350 - 8 units 351-400 - 10 units >400 Call MD   unknown at unknown  . omeprazole (PRILOSEC) 40 MG capsule Take 1 capsule by mouth daily.   unknown at unknown  . potassium chloride (K-DUR,KLOR-CON) 10 MEQ tablet Take 10 mEq by mouth daily.   unknown at unknown  . prednisoLONE acetate (PRED FORTE) 1 % ophthalmic suspension Place 1 drop into the right eye 2 (two) times daily.   unknown at unknown  . rosuvastatin (CRESTOR) 20 MG tablet Take 20 mg by mouth daily.   unknown at unknown  . sertraline (ZOLOFT) 100 MG tablet Take 100 mg by mouth daily.   unknown at unknown  . sotalol AF (BETAPACE AF) 80 MG TABS tablet Take 80 mg by mouth 2 (two) times daily.   unknown at unknown  . spironolactone (ALDACTONE) 50 MG tablet Take 50 mg by mouth daily.   unknown at unknown  . valACYclovir (VALTREX) 1000 MG tablet Take 1,000 mg by mouth daily.   unknown at unknown  . cephALEXin (KEFLEX) 500 MG capsule Take 1 capsule (500 mg total) by mouth 3 (three) times daily. (Patient not  taking: Reported on 05/31/2018) 30 capsule 0 Completed Course at Unknown time  . insulin aspart (NOVOLOG) 100 UNIT/ML injection 1-150 - no coverage.  151-200 - 2 units 201-250 4 units 251-300 - 6 units 301-350 - 8 units 351-400 - 10 units and call MD. (Patient not taking: Reported on 05/31/2018) 10 mL 11 Not Taking at Unknown time  . traMADol (ULTRAM) 50 MG tablet Take 1 tablet (50 mg total) by mouth 2 (two) times daily as needed. (Patient not taking: Reported on 06/01/2018) 10 tablet 0 Not Taking at Unknown time    Assessment: Pharmacy consulted to dose heparin in this 72 year old female admitted with PE.  No prior anticoag noted ,  CrCl = 50.8 ml/min  Goal of Therapy:  Heparin level 0.3-0.7 units/ml Monitor platelets by anticoagulation protocol: Yes   Plan:  Give 5000 units bolus x 1 Start heparin infusion at 1500 units/hr Check anti-Xa level in 8 hours and daily while on heparin Continue to monitor H&H and platelets   6/30 HL@ 1019= 1.41. Will hold heparin drip x 1 hour and resume at lower rate of 1350 units/hr. Will recheck HL in 8 hrs. Spoke with floor RN and pt in Ultrasound. Called Ultrasound to have them stop Heparin drip.  6/30 HL @ 2139  =0.89. Level is still supratherapeutic. Will adjust infusion to 1200u/hr. Will recheck HL in 8 hours. CBC with AM labs per protocol.   Gardner CandleSheema M Orlena Garmon, PharmD, BCPS Clinical Pharmacist 06/01/2018 10:02 PM

## 2018-06-01 NOTE — NC FL2 (Signed)
[place] MEDICAID FL2 LEVEL OF CARE SCREENING TOOL     IDENTIFICATION  Patient Name: Virginia CreeGlenda C Privitera Birthdate: July 24, 1946 Sex: female Admission Date (Current Location): 05/31/2018  [place]:    [id number] Q Facility and Address:  [facility], [address], [place], [place] [address]      Provider Number: [id number]  Attending Physician Name and Address:  Houston SirenSainani, Vivek J, MD  Relative Name and Phone Number:  Leverne HumblesMarshall Sheaffer [facility](Son) [phone]    Current Level of Care: Hospital Recommended Level of Care: Assisted Living Facility Prior Approval Number:    Date Approved/Denied:   PASRR Number:    Discharge Plan: Domiciliary (Rest home)    Current Diagnoses: Patient Active Problem List   Diagnosis Date Noted  . 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 Chronic diastolic heart failure (HCC) 02/17/2018  . HTN (hypertension) 02/17/2018  . PAF (paroxysmal atrial fibrillation) (HCC) 02/17/2018  . Lymphedema 02/17/2018  . Diabetes (HCC) 02/17/2018  . Sepsis (HCC) 01/30/2018  . Hypoglycemia 01/12/2018  . Knee pain 05/17/2017    Orientation RESPIRATION BLADDER Height & Weight     Self, Time, Situation, Place  O2(2L o2) Incontinent Weight: 192 lb (87.1 kg) Height:  5\' 7"  (170.2 cm)  BEHAVIORAL SYMPTOMS/MOOD NEUROLOGICAL BOWEL NUTRITION STATUS      Incontinent Diet(Heart healthy/Carb modified)  AMBULATORY STATUS COMMUNICATION OF NEEDS Skin   Supervision Verbally Normal                       Personal Care Assistance Level of Assistance  Bathing, Feeding, Dressing Bathing Assistance: Limited assistance Feeding assistance: Independent Dressing Assistance: Limited assistance     Functional Limitations Info  Sight, Hearing, Speech Sight Info: Adequate Hearing Info: Adequate Speech Info: Adequate    SPECIAL CARE FACTORS FREQUENCY                       Contractures Contractures Info: Not present     Additional Factors Info  Code Status, Allergies Code Status Info: Full Allergies Info: Peanuts Peanut Oil, Lasix Furosemide, Lisinopril           Current Medications (06/01/2018):  This is the current hospital active medication list Current Facility-Administered Medications  Medication Dose Route Frequency Provider Last Rate Last Dose  . acetaminophen (TYLENOL) tablet 650 mg  650 mg Oral Q6H PRN Oralia ManisWillis, David, MD       Or  . acetaminophen (TYLENOL) suppository 650 mg  650 mg Rectal Q6H PRN Oralia ManisWillis, David, MD      . heparin ADULT infusion 100 units/mL (25000 units/23750mL sodium chloride 0.45%)  1,350 Units/hr Intravenous Continuous Houston SirenSainani, Vivek J, MD 13.5 mL/hr at 06/01/18 1300 1,350 Units/hr at 06/01/18 1300  . insulin aspart (novoLOG) injection 0-9 Units  0-9 Units Subcutaneous Q6H Oralia ManisWillis, David, MD   1 Units at 06/01/18 [phone]  . ondansetron (ZOFRAN) tablet 4 mg  4 mg Oral Q6H PRN Oralia ManisWillis, David, MD       Or  . ondansetron [facility](ZOFRAN) injection 4 mg  4 mg Intravenous Q6H PRN Oralia ManisWillis, David, MD      . oxyCODONE (Oxy IR/ROXICODONE) immediate release tablet 5 mg  5 mg Oral Q4H PRN Oralia ManisWillis, David, MD   5 mg at 06/01/18 1033  . pantoprazole (PROTONIX) EC tablet 40 mg  40 mg Oral Daily Oralia ManisWillis, David, MD   40 mg at 06/01/18 1001  . prednisoLONE acetate (PRED FORTE) 1 % ophthalmic suspension 1 drop  1 drop Right Eye BID Oralia Manis, MD   1 drop at 06/01/18 1007  . rosuvastatin (CRESTOR) tablet 20 mg  20 mg Oral Daily Oralia Manis, MD   20 mg at 06/01/18 1001  . sertraline (ZOLOFT) tablet 100 mg  100 mg Oral Daily Oralia Manis, MD   100 mg at 06/01/18 1005  . sotalol (BETAPACE) tablet 80 mg  80 mg Oral BID Oralia Manis, MD   80 mg at 06/01/18 1002     Discharge Medications: Please see discharge summary for a list of discharge medications.  Relevant Imaging Results:  Relevant Lab Results:   Additional Information SS#[id number]  Judi Cong, LCSW

## 2018-06-01 NOTE — Progress Notes (Addendum)
ANTICOAGULATION CONSULT NOTE   Pharmacy Consult for Heparin  Indication: pulmonary embolus  Allergies  Allergen Reactions  . Peanuts [Peanut Oil] Shortness Of Breath  . Lasix [Furosemide] Swelling    Tongue swelling  . Lisinopril Swelling    Tongue swelling    Patient Measurements: Height: 5\' 7"  (170.2 cm) Weight: 192 lb (87.1 kg) IBW/kg (Calculated) : 61.6 Heparin Dosing Weight: 84.2 kg   Vital Signs: Temp: 98 F (36.7 C) (06/30 0515) Temp Source: Oral (06/30 0515) BP: 98/56 (06/30 0515) Pulse Rate: 101 (06/30 0515)  Labs: Recent Labs    05/31/18 2140 05/31/18 2337 06/01/18 0153 06/01/18 0738 06/01/18 1019  HGB 13.4  --  12.4  --   --   HCT 41.9  --  38.9  --   --   PLT 240  --  229  --   --   APTT  --  27  --   --   --   LABPROT  --  14.8  --   --   --   INR  --  1.17  --   --   --   HEPARINUNFRC  --   --   --   --  1.41*  CREATININE 1.18*  --  1.09*  --   --   TROPONINI 0.31*  --  0.77* 0.67*  --     Estimated Creatinine Clearance: 52.9 mL/min (A) (by C-G formula based on SCr of 1.09 mg/dL (H)).   Medical History: Past Medical History:  Diagnosis Date  . Arrhythmia    atrial fibrillation  . CHF (congestive heart failure) (HCC)   . Diabetes mellitus without complication (HCC)   . Diverticulitis   . Frequent falls   . Hypertension     Medications:  Medications Prior to Admission  Medication Sig Dispense Refill Last Dose  . ASPIRIN LOW DOSE 81 MG EC tablet Take 1 tablet by mouth daily.   unknown at unknown  . Calcium Carbonate-Vitamin D3 (CALCIUM 600/VITAMIN D) 600-400 MG-UNIT TABS Take 1 tablet by mouth 2 (two) times daily.   unknown at unknown  . cetirizine (ZYRTEC) 10 MG tablet Take 10 mg by mouth at bedtime.    unknown at unknown  . ferrous sulfate 325 (65 FE) MG EC tablet Take 325 mg by mouth daily.   unknown at unknown  . fesoterodine (TOVIAZ) 8 MG TB24 tablet Take 8 mg by mouth daily.   unknown at unknown  . fluorometholone (FML) 0.1 %  ophthalmic suspension Place 1 drop into the right eye 2 (two) times daily.   unknown at unknown  . fluticasone (FLONASE) 50 MCG/ACT nasal spray Place 2 sprays into both nostrils daily.   unknown at unknown  . furosemide (LASIX) 40 MG tablet Take 1 tablet (40 mg total) by mouth daily. 30 tablet 1 unknown at unknown  . glimepiride (AMARYL) 2 MG tablet Take 2 mg by mouth 2 (two) times daily.   unknown at unknown  . hydrALAZINE (APRESOLINE) 25 MG tablet Take 25 mg by mouth 2 (two) times daily.   unknown at unknown  . ipratropium (ATROVENT) 0.03 % nasal spray Place 2 sprays into both nostrils 3 (three) times daily as needed for rhinitis.   unknown at unknown  . NOVOLOG FLEXPEN 100 UNIT/ML FlexPen Inject 0-10 Units into the skin 3 (three) times daily with meals. <60 Call MD 151-200 - 2 units 201-250 4 units 251-300 - 6 units 301-350 - 8 units 351-400 - 10 units >400 Call  MD   unknown at unknown  . omeprazole (PRILOSEC) 40 MG capsule Take 1 capsule by mouth daily.   unknown at unknown  . potassium chloride (K-DUR,KLOR-CON) 10 MEQ tablet Take 10 mEq by mouth daily.   unknown at unknown  . prednisoLONE acetate (PRED FORTE) 1 % ophthalmic suspension Place 1 drop into the right eye 2 (two) times daily.   unknown at unknown  . rosuvastatin (CRESTOR) 20 MG tablet Take 20 mg by mouth daily.   unknown at unknown  . sertraline (ZOLOFT) 100 MG tablet Take 100 mg by mouth daily.   unknown at unknown  . sotalol AF (BETAPACE AF) 80 MG TABS tablet Take 80 mg by mouth 2 (two) times daily.   unknown at unknown  . spironolactone (ALDACTONE) 50 MG tablet Take 50 mg by mouth daily.   unknown at unknown  . valACYclovir (VALTREX) 1000 MG tablet Take 1,000 mg by mouth daily.   unknown at unknown  . cephALEXin (KEFLEX) 500 MG capsule Take 1 capsule (500 mg total) by mouth 3 (three) times daily. (Patient not taking: Reported on 05/31/2018) 30 capsule 0 Completed Course at Unknown time  . insulin aspart (NOVOLOG) 100 UNIT/ML  injection 1-150 - no coverage.  151-200 - 2 units 201-250 4 units 251-300 - 6 units 301-350 - 8 units 351-400 - 10 units and call MD. (Patient not taking: Reported on 05/31/2018) 10 mL 11 Not Taking at Unknown time  . traMADol (ULTRAM) 50 MG tablet Take 1 tablet (50 mg total) by mouth 2 (two) times daily as needed. (Patient not taking: Reported on 06/01/2018) 10 tablet 0 Not Taking at Unknown time    Assessment: Pharmacy consulted to dose heparin in this 72 year old female admitted with PE.  No prior anticoag noted ,  CrCl = 50.8 ml/min  Goal of Therapy:  Heparin level 0.3-0.7 units/ml Monitor platelets by anticoagulation protocol: Yes   Plan:  Give 5000 units bolus x 1 Start heparin infusion at 1500 units/hr Check anti-Xa level in 8 hours and daily while on heparin Continue to monitor H&H and platelets   6/30 HL@ 1019= 1.41. Will hold heparin drip x 1 hour and resume at lower rate of 1350 units/hr. Will recheck HL in 8 hrs. Spoke with floor RN and pt in Ultrasound. Called Ultrasound to have them stop Heparin drip.  Durinda Buzzelli A 06/01/2018,11:17 AM

## 2018-06-01 NOTE — H&P (Signed)
[facility] [place] [facility]   PATIENT NAME: Virginia Barnes    MR#:  [id number]  DATE OF BIRTH:  20-Aug-1946  DATE OF ADMISSION:  05/31/2018  PRIMARY CARE PHYSICIAN: Margaretann Loveless, MD   REQUESTING/REFERRING PHYSICIAN: Don Perking, MD  CHIEF COMPLAINT:   Chief Complaint  Patient presents with  . Abdominal Injury  . Fall  . Urinary Tract Infection    HISTORY OF PRESENT ILLNESS:  Virginia Barnes  is a 72 y.o. female who presents with fall, shortness of breath with exertion, right-sided reproducible chest discomfort.  Patient states that she fell a few days ago and she thinks that her chest pain is from that.  However, while here her EKG showed S1 Q 3 T3 changes, and her troponin was elevated at 0.31.  ED physician scan her chest and she is found to have large saddle embolus with right heart strain.  She is hemodynamically stable, and vascular surgery states that if she remains so and does not get hypoxic with walking in the morning and there would be no need for TPA.  Hospitalist were called for admission.  PAST MEDICAL HISTORY:   Past Medical History:  Diagnosis Date  . Arrhythmia    atrial fibrillation  . CHF (congestive heart failure) (HCC)   . Diabetes mellitus without complication (HCC)   . Diverticulitis   . Frequent falls   . Hypertension      PAST SURGICAL HISTORY:   Past Surgical History:  Procedure Laterality Date  . ABDOMINAL HYSTERECTOMY    . CARDIAC CATHETERIZATION Left 04/13/2016   Procedure: Left Heart Cath and Coronary Angiography;  Surgeon: Laurier Nancy, MD;  Location: [facility] INVASIVE CV LAB;  Service: Cardiovascular;  Laterality: Left;  . JOINT REPLACEMENT       SOCIAL HISTORY:   Social History   Tobacco Use  . Smoking status: Former Smoker    Packs/day: 0.50    Years: 15.00    Pack years: 7.50    Types: Cigarettes  . Smokeless tobacco: Never Used  Substance Use Topics  . Alcohol use: No     FAMILY HISTORY:    Family History  Problem Relation Age of Onset  . Stomach cancer Sister 52  . Vaginal cancer Sister 52  . Heart disease Father   . Breast cancer Neg Hx      DRUG ALLERGIES:   Allergies  Allergen Reactions  . Peanuts [Peanut Oil] Shortness Of Breath  . Lasix [Furosemide] Swelling    Tongue swelling  . Lisinopril Swelling    Tongue swelling    MEDICATIONS AT HOME:   Prior to Admission medications   Medication Sig Start Date End Date Taking? Authorizing Provider  ASPIRIN LOW DOSE 81 MG EC tablet Take 1 tablet by mouth daily. 09/26/15  Yes [provider]  Calcium Carbonate-Vitamin D3 (CALCIUM 600/VITAMIN D) 600-400 MG-UNIT TABS Take 1 tablet by mouth 2 (two) times daily.   Yes [provider]  cetirizine (ZYRTEC) 10 MG tablet Take 10 mg by mouth at bedtime.    Yes [provider]  ferrous sulfate 325 (65 FE) MG EC tablet Take 325 mg by mouth daily.   Yes [provider]  fesoterodine (TOVIAZ) 8 MG TB24 tablet Take 8 mg by mouth daily.   Yes [provider]  fluorometholone (FML) 0.1 % ophthalmic suspension Place 1 drop into the right eye 2 (two) times daily.   Yes [provider]  fluticasone (FLONASE) 50 MCG/ACT nasal  spray Place 2 sprays into both nostrils daily.   Yes [provider]  furosemide (LASIX) 40 MG tablet Take 1 tablet (40 mg total) by mouth daily. 02/06/18 02/06/19 Yes Salary, Jetty Duhamel D, MD  glimepiride (AMARYL) 2 MG tablet Take 2 mg by mouth 2 (two) times daily.   Yes [provider]  hydrALAZINE (APRESOLINE) 25 MG tablet Take 25 mg by mouth 2 (two) times daily.   Yes [provider]  ipratropium (ATROVENT) 0.03 % nasal spray Place 2 sprays into both nostrils 3 (three) times daily as needed for rhinitis.   Yes [provider]  NOVOLOG FLEXPEN 100 UNIT/ML FlexPen Inject 0-10 Units into the skin 3 (three) times daily with meals. <60 Call MD 151-200 - 2 units 201-250 4  units 251-300 - 6 units 301-350 - 8 units 351-400 - 10 units >400 Call MD 05/23/18  Yes [provider]  omeprazole (PRILOSEC) 40 MG capsule Take 1 capsule by mouth daily. 09/26/15  Yes [provider]  potassium chloride (K-DUR,KLOR-CON) 10 MEQ tablet Take 10 mEq by mouth daily. 05/09/18  Yes [provider]  prednisoLONE acetate (PRED FORTE) 1 % ophthalmic suspension Place 1 drop into the right eye 2 (two) times daily.   Yes [provider]  rosuvastatin (CRESTOR) 20 MG tablet Take 20 mg by mouth daily.   Yes [provider]  sertraline (ZOLOFT) 100 MG tablet Take 100 mg by mouth daily.   Yes [provider]  sotalol AF (BETAPACE AF) 80 MG TABS tablet Take 80 mg by mouth 2 (two) times daily.   Yes [provider]  spironolactone (ALDACTONE) 50 MG tablet Take 50 mg by mouth daily.   Yes [provider]  valACYclovir (VALTREX) 1000 MG tablet Take 1,000 mg by mouth daily.   Yes [provider]  cephALEXin (KEFLEX) 500 MG capsule Take 1 capsule (500 mg total) by mouth 3 (three) times daily. Patient not taking: Reported on 05/31/2018 05/13/18   Phineas Semen, MD  insulin aspart (NOVOLOG) 100 UNIT/ML injection 1-150 - no coverage.  151-200 - 2 units 201-250 4 units 251-300 - 6 units 301-350 - 8 units 351-400 - 10 units and call MD. Patient not taking: Reported on 05/31/2018 01/13/18   Houston Siren, MD  traMADol (ULTRAM) 50 MG tablet Take 1 tablet (50 mg total) by mouth 2 (two) times daily as needed. Patient not taking: Reported on 06/01/2018 02/06/18 02/06/19  Salary, Evelena Asa, MD    REVIEW OF SYSTEMS:  Review of Systems  Constitutional: Negative for chills, fever, malaise/fatigue and weight loss.  HENT: Negative for ear pain, hearing loss and tinnitus.   Eyes: Negative for blurred vision, double vision, pain and redness.  Respiratory: Positive for shortness of breath (On exertion). Negative for cough and  hemoptysis.   Cardiovascular: Negative for chest pain, palpitations, orthopnea and leg swelling.  Gastrointestinal: Negative for abdominal pain, constipation, diarrhea, nausea and vomiting.  Genitourinary: Negative for dysuria, frequency and hematuria.  Musculoskeletal: Positive for falls. Negative for back pain, joint pain and neck pain.  Skin:       No acne, rash, or lesions  Neurological: Negative for dizziness, tremors, focal weakness and weakness.  Endo/Heme/Allergies: Negative for polydipsia. Does not bruise/bleed easily.  Psychiatric/Behavioral: Negative for depression. The patient is not nervous/anxious and does not have insomnia.      VITAL SIGNS:   Vitals:   05/31/18 2123 05/31/18 2125 05/31/18 2220 05/31/18 2321  BP: (!) 141/71  128/61 Marland Kitchen)  121/59  Pulse: 93  90 89  Resp: 18  17 20   Temp: 98.4 F (36.9 C)     TempSrc: Oral     SpO2: 94%  93% 96%  Weight:  87.5 kg (193 lb)    Height:  5\' 9"  (1.753 m)     Wt Readings from Last 3 Encounters:  05/31/18 87.5 kg (193 lb)  05/17/18 90.7 kg (200 lb)  05/13/18 90.7 kg (200 lb)    PHYSICAL EXAMINATION:  Physical Exam  Vitals reviewed. Constitutional: She is oriented to person, place, and time. She appears well-developed and well-nourished. No distress.  HENT:  Head: Normocephalic and atraumatic.  Mouth/Throat: Oropharynx is clear and moist.  Eyes: Pupils are equal, round, and reactive to light. Conjunctivae and EOM are normal. No scleral icterus.  Neck: Normal range of motion. Neck supple. No JVD present. No thyromegaly present.  Cardiovascular: Normal rate, regular rhythm and intact distal pulses. Exam reveals no gallop and no friction rub.  No murmur heard. Respiratory: Effort normal and breath sounds normal. No respiratory distress. She has no wheezes. She has no rales. She exhibits tenderness (Right chest, reproducible to palpation).  GI: Soft. Bowel sounds are normal. She exhibits no distension. There is no  tenderness.  Musculoskeletal: Normal range of motion. She exhibits no edema.  No arthritis, no gout  Lymphadenopathy:    She has no cervical adenopathy.  Neurological: She is alert and oriented to person, place, and time. No cranial nerve deficit.  No dysarthria, no aphasia  Skin: Skin is warm and dry. No rash noted. No erythema.  Psychiatric: She has a normal mood and affect. Her behavior is normal. Judgment and thought content normal.    LABORATORY PANEL:   CBC Recent Labs  Lab 05/31/18 2140  WBC 12.1*  HGB 13.4  HCT 41.9  PLT 240   ------------------------------------------------------------------------------------------------------------------  Chemistries  Recent Labs  Lab 05/31/18 2140  NA 138  K 4.7  CL 103  CO2 26  GLUCOSE 165*  BUN 20  CREATININE 1.18*  CALCIUM 9.7   ------------------------------------------------------------------------------------------------------------------  Cardiac Enzymes Recent Labs  Lab 05/31/18 2140  TROPONINI 0.31*   ------------------------------------------------------------------------------------------------------------------  RADIOLOGY:  Dg Ribs Unilateral W/chest Right  Result Date: 05/31/2018 CLINICAL DATA:  Right chest wall pain after fall 2 days ago. EXAM: RIGHT RIBS AND CHEST - 3+ VIEW COMPARISON:  Chest x-ray dated February 02, 2018. FINDINGS: No fracture or other bone lesions are seen involving the ribs. Stable cardiomegaly. Mild pulmonary vascular congestion. Atherosclerotic calcification of the aortic arch. Left lower lobe atelectasis/scar. No focal consolidation, pleural effusion, or pneumothorax. IMPRESSION: 1. No rib fracture. 2. Mild pulmonary vascular congestion. Electronically Signed   By: Obie Dredge M.D.   On: 05/31/2018 22:39   Ct Angio Chest Pe W And/or Wo Contrast  Result Date: 05/31/2018 CLINICAL DATA:  Right-sided pleuritic chest pain and shortness of breath. EXAM: CT ANGIOGRAPHY CHEST WITH CONTRAST  TECHNIQUE: Multidetector CT imaging of the chest was performed using the standard protocol during bolus administration of intravenous contrast. Multiplanar CT image reconstructions and MIPs were obtained to evaluate the vascular anatomy. CONTRAST:  75mL OMNIPAQUE IOHEXOL 350 MG/ML SOLN COMPARISON:  CT chest dated November 21, 2015. FINDINGS: Cardiovascular: Central saddle pulmonary embolus with lobar pulmonary emboli involving all lobes of both lungs. Segmental pulmonary emboli in both lower lobes and the right middle lobe. Elevated RV/LV ratio, measuring 1.6. Prominent dilatation of the right atrium with reflux of contrast into the intrahepatic veins. No pericardial effusion.  Normal caliber thoracic aorta. Coronary, aortic arch, and branch vessel atherosclerotic vascular disease. Mediastinum/Nodes: No enlarged mediastinal, hilar, or axillary lymph nodes. Thyroid gland, trachea, and esophagus demonstrate no significant findings. Small hiatal hernia. Lungs/Pleura: Wedge-shaped ground-glass density in the peripheral right lower lobe may reflect early pulmonary infarct. Bilateral lower lobe subsegmental atelectasis. No focal consolidation, pleural effusion, or pneumothorax. No suspicious pulmonary nodule. Upper Abdomen: No acute abnormality. Slightly enlarged simple right renal cyst measuring 4.7 cm. Musculoskeletal: No chest wall abnormality. No acute or significant osseous findings. Review of the MIP images confirms the above findings. IMPRESSION: 1. Acute central saddle pulmonary embolus with lobar pulmonary emboli involving all lobes of both lungs. CT evidence of right heart strain (RV/LV Ratio = 1.6) consistent with at least submassive (intermediate risk) PE. The presence of right heart strain has been associated with an increased risk of morbidity and mortality. Please activate Code PE by paging [phone]. 2. Wedge-shaped ground-glass density in the peripheral right lower lobe may reflect early pulmonary  infarct. 3.  Aortic atherosclerosis (ICD10-I70.0). Critical Value/emergent results were called by telephone at the time of interpretation on 05/31/2018 at 11:33 pm to Dr. Nita SickleAROLINA VERONESE , who verbally acknowledged these results. Electronically Signed   By: Obie DredgeWilliam T Derry M.D.   On: 05/31/2018 23:35    EKG:   Orders placed or performed during the hospital encounter of 05/31/18  . ED EKG within 10 minutes  . ED EKG within 10 minutes  . ED EKG  . ED EKG  . EKG 12-Lead  . EKG 12-Lead  . EKG 12-Lead  . EKG 12-Lead    IMPRESSION AND PLAN:  Principal Problem:   Saddle pulmonary embolus (HCC) -heparin drip started, patient will need to be ambulated in the morning with oximetry to see if she drops her sats.  If she does then vascular surgery should be consulted for evaluation for possibility of TPA administration.  Otherwise she can be treated with standard anticoagulation.  Echocardiogram ordered, trend cardiac enzymes Active Problems:   Chronic diastolic heart failure (HCC) -echo as above   HTN (hypertension) -continue home meds   PAF (paroxysmal atrial fibrillation) (HCC) -continue home medications including sotalol, though I do feel like this is probably what masked any tachycardia she might of had   Diabetes (HCC) -sliding scale insulin with corresponding glucose checks  Chart review performed and case discussed with ED provider. Labs, imaging and/or ECG reviewed by provider and discussed with patient/family. Management plans discussed with the patient and/or family.  DVT PROPHYLAXIS: Systemic anticoagulation  GI PROPHYLAXIS: PPI  ADMISSION STATUS: Inpatient  CODE STATUS: Full Code Status History    Date Active Date Inactive Code Status Order ID Comments User Context   01/30/2018 1826 02/06/2018 2049 Full Code [id number]  Ihor AustinPyreddy, Pavan, MD ED   01/12/2018 1118 01/13/2018 1939 Full Code [id number]  Houston SirenSainani, Vivek J, MD Inpatient      TOTAL TIME TAKING CARE OF THIS PATIENT: 45  minutes.   Anne HahnWILLIS, [place] Current FIELDING 06/01/2018, 12:16 AM  [employer] [facility]  [facility]  Office  [phone]  CC: Primary care physician; Margaretann LovelessKhan, Neelam S, MD  Note:  This document was prepared using Dragon voice recognition software and may include unintentional dictation errors.

## 2018-06-02 LAB — CBC
HEMATOCRIT: 36.9 % (ref 35.0–47.0)
HEMOGLOBIN: 12 g/dL (ref 12.0–16.0)
MCH: 28.6 pg (ref 26.0–34.0)
MCHC: 32.5 g/dL (ref 32.0–36.0)
MCV: 87.8 fL (ref 80.0–100.0)
Platelets: 214 10*3/uL (ref 150–440)
RBC: 4.2 MIL/uL (ref 3.80–5.20)
RDW: 15.9 % — AB (ref 11.5–14.5)
WBC: 7.5 10*3/uL (ref 3.6–11.0)

## 2018-06-02 LAB — GLUCOSE, CAPILLARY
GLUCOSE-CAPILLARY: 129 mg/dL — AB (ref 70–99)
GLUCOSE-CAPILLARY: 171 mg/dL — AB (ref 70–99)
Glucose-Capillary: 158 mg/dL — ABNORMAL HIGH (ref 70–99)
Glucose-Capillary: 193 mg/dL — ABNORMAL HIGH (ref 70–99)

## 2018-06-02 LAB — HEPARIN LEVEL (UNFRACTIONATED): Heparin Unfractionated: 0.62 IU/mL (ref 0.30–0.70)

## 2018-06-02 MED ORDER — RIVAROXABAN 20 MG PO TABS: 20.0000 mg | ORAL_TABLET | Freq: Every day | ORAL | Status: DC

## 2018-06-02 MED ORDER — RIVAROXABAN 15 MG PO TABS
15.0000 mg | ORAL_TABLET | Freq: Two times a day (BID) | ORAL | Status: DC
  Administered 2018-06-02 – 2018-06-04 (×5): 15 mg via ORAL
  Filled 2018-06-02 (×6): qty 1

## 2018-06-02 NOTE — Plan of Care (Signed)
  Problem: Clinical Measurements: Goal: Ability to maintain clinical measurements within normal limits will improve Outcome: Not Progressing Note:  Last troponin value is elevated at .61. Will continue to monitor cardiac lab values. Jari FavreSteven M [facility]

## 2018-06-02 NOTE — Progress Notes (Signed)
[facility]  Daily Progress Note   Subjective  - * No surgery found *  Patient without reported shortness of breath or chest pain today AF/VSS No hypoxia and now off of oxygen  Objective Vitals:   06/01/18 1836 06/01/18 1954 06/02/18 0511 06/02/18 0957  BP: (!) 125/54 (!) 133/57 (!) 113/53 (!) 110/46  Pulse: 82 81 72 70  Resp:  18 18 19   Temp: 98 F (36.7 C) 98.4 F (36.9 C) 98 F (36.7 C)   TempSrc: Oral Oral Oral   SpO2: 97% 95% 93% 90%  Weight:   191 lb 4.8 oz (86.8 kg)   Height:        Intake/Output Summary (Last 24 hours) at 06/02/2018 1404 Last data filed at 06/02/2018 1320 Gross per 24 hour  Intake 239.83 ml  Output 200 ml  Net 39.83 ml    PULM  CTAB CV  RRR   Laboratory CBC    Component Value Date/Time   WBC 7.5 06/02/2018 0524   HGB 12.0 06/02/2018 0524   HGB 11.8 (L) 05/11/2013 0039   HCT 36.9 06/02/2018 0524   HCT 36.5 05/11/2013 0039   PLT 214 06/02/2018 0524   PLT 258 05/11/2013 0039    BMET    Component Value Date/Time   NA 135 06/01/2018 0153   NA 143 05/11/2013 0039   K 4.8 06/01/2018 0153   K 3.3 (L) 05/11/2013 0039   CL 106 06/01/2018 0153   CL 110 (H) 05/11/2013 0039   CO2 22 06/01/2018 0153   CO2 27 05/11/2013 0039   GLUCOSE 180 (H) 06/01/2018 0153   GLUCOSE 103 (H) 05/11/2013 0039   BUN 18 06/01/2018 0153   BUN 14 05/11/2013 0039   CREATININE 1.09 (H) 06/01/2018 0153   CREATININE 0.98 05/11/2013 0039   CALCIUM 8.7 (L) 06/01/2018 0153   CALCIUM 8.9 05/11/2013 0039   GFRNONAA 49 (L) 06/01/2018 0153   GFRNONAA 60 (L) 05/11/2013 0039   GFRAA 57 (L) 06/01/2018 0153   GFRAA >60 05/11/2013 0039    Assessment/Planning: Bilateral PE, RLE DVT   She is doing well on anticoagulation alone  Have discussed case with primary service.  Likely no role for thrombolytic therapy/thrombectomy at this time given her lack of symptoms and clinical stability  Can transition to oral anticoagulants  Will sign off,  please call with questions.    Festus BarrenJason Jeyson Deshotel  06/02/2018, 2:04 PM

## 2018-06-02 NOTE — Progress Notes (Signed)
[facility] - [place] [facility]   PATIENT NAME: Virginia Barnes    MR#:  [id number]  DATE OF BIRTH:  1946/01/29  SUBJECTIVE:   Pt. Here after a fall and noted to have saddle pulmonary embolism.  Tolerating heparin drip, no further hypoxemia, hemoptysis or any other complaints.  Dopplers were positive for small right-sided DVT.  REVIEW OF SYSTEMS:    Review of Systems  Constitutional: Negative for chills and fever.  HENT: Negative for congestion and tinnitus.   Eyes: Negative for blurred vision and double vision.  Respiratory: Negative for cough, shortness of breath and wheezing.   Cardiovascular: Positive for chest pain (Pleuritic). Negative for orthopnea and PND.  Gastrointestinal: Negative for abdominal pain, diarrhea, nausea and vomiting.  Genitourinary: Negative for dysuria and hematuria.  Neurological: Negative for dizziness, sensory change and focal weakness.  All other systems reviewed and are negative.   Nutrition: Carb control/heart Healthy Tolerating Diet: Yes Tolerating PT: Await Eval.    DRUG ALLERGIES:   Allergies  Allergen Reactions  . Peanuts [Peanut Oil] Shortness Of Breath  . Lasix [Furosemide] Swelling    Tongue swelling  . Lisinopril Swelling    Tongue swelling    VITALS:  Blood pressure (!) 110/46, pulse 70, temperature 98 F (36.7 C), temperature source Oral, resp. rate 19, height 5\' 7"  (1.702 m), weight 86.8 kg (191 lb 4.8 oz), SpO2 90 %.  PHYSICAL EXAMINATION:   Physical Exam  GENERAL:  72 y.o.-year-old patient lying in bed in no acute distress.  EYES: Pupils equal, round, reactive to light and accommodation. No scleral icterus. Extraocular muscles intact.  HEENT: Head atraumatic, normocephalic. Oropharynx and nasopharynx clear.  NECK:  Supple, no jugular venous distention. No thyroid enlargement, no tenderness.  LUNGS: Normal breath sounds bilaterally, no wheezing, rales, rhonchi. No use of accessory muscles of respiration.   CARDIOVASCULAR: S1, S2 normal. No murmurs, rubs, or gallops.  ABDOMEN: Soft, nontender, nondistended. Bowel sounds present. No organomegaly or mass.  EXTREMITIES: No cyanosis, clubbing or edema b/l.    NEUROLOGIC: Cranial nerves II through XII are intact. No focal Motor or sensory deficits b/l.  Globally weak. PSYCHIATRIC: The patient is alert and oriented x 2.  SKIN: No obvious rash, lesion, or ulcer.    LABORATORY PANEL:   CBC Recent Labs  Lab 06/02/18 0524  WBC 7.5  HGB 12.0  HCT 36.9  PLT 214   ------------------------------------------------------------------------------------------------------------------  Chemistries  Recent Labs  Lab 06/01/18 0153  NA 135  K 4.8  CL 106  CO2 22  GLUCOSE 180*  BUN 18  CREATININE 1.09*  CALCIUM 8.7*   ------------------------------------------------------------------------------------------------------------------  Cardiac Enzymes Recent Labs  Lab 06/01/18 1450  TROPONINI 0.61*   ------------------------------------------------------------------------------------------------------------------  RADIOLOGY:  Dg Ribs Unilateral W/chest Right  Result Date: 05/31/2018 CLINICAL DATA:  Right chest wall pain after fall 2 days ago. EXAM: RIGHT RIBS AND CHEST - 3+ VIEW COMPARISON:  Chest x-ray dated February 02, 2018. FINDINGS: No fracture or other bone lesions are seen involving the ribs. Stable cardiomegaly. Mild pulmonary vascular congestion. Atherosclerotic calcification of the aortic arch. Left lower lobe atelectasis/scar. No focal consolidation, pleural effusion, or pneumothorax. IMPRESSION: 1. No rib fracture. 2. Mild pulmonary vascular congestion. Electronically Signed   By: Obie DredgeWilliam T Derry M.D.   On: 05/31/2018 22:39   Ct Angio Chest Pe W And/or Wo Contrast  Result Date: 05/31/2018 CLINICAL DATA:  Right-sided pleuritic chest pain and shortness of breath. EXAM: CT ANGIOGRAPHY CHEST WITH CONTRAST TECHNIQUE: Multidetector CT imaging  of the chest was performed using the standard protocol during bolus administration of intravenous contrast. Multiplanar CT image reconstructions and MIPs were obtained to evaluate the vascular anatomy. CONTRAST:  75mL OMNIPAQUE IOHEXOL 350 MG/ML SOLN COMPARISON:  CT chest dated November 21, 2015. FINDINGS: Cardiovascular: Central saddle pulmonary embolus with lobar pulmonary emboli involving all lobes of both lungs. Segmental pulmonary emboli in both lower lobes and the right middle lobe. Elevated RV/LV ratio, measuring 1.6. Prominent dilatation of the right atrium with reflux of contrast into the intrahepatic veins. No pericardial effusion. Normal caliber thoracic aorta. Coronary, aortic arch, and branch vessel atherosclerotic vascular disease. Mediastinum/Nodes: No enlarged mediastinal, hilar, or axillary lymph nodes. Thyroid gland, trachea, and esophagus demonstrate no significant findings. Small hiatal hernia. Lungs/Pleura: Wedge-shaped ground-glass density in the peripheral right lower lobe may reflect early pulmonary infarct. Bilateral lower lobe subsegmental atelectasis. No focal consolidation, pleural effusion, or pneumothorax. No suspicious pulmonary nodule. Upper Abdomen: No acute abnormality. Slightly enlarged simple right renal cyst measuring 4.7 cm. Musculoskeletal: No chest wall abnormality. No acute or significant osseous findings. Review of the MIP images confirms the above findings. IMPRESSION: 1. Acute central saddle pulmonary embolus with lobar pulmonary emboli involving all lobes of both lungs. CT evidence of right heart strain (RV/LV Ratio = 1.6) consistent with at least submassive (intermediate risk) PE. The presence of right heart strain has been associated with an increased risk of morbidity and mortality. Please activate Code PE by paging [phone]. 2. Wedge-shaped ground-glass density in the peripheral right lower lobe may reflect early pulmonary infarct. 3.  Aortic atherosclerosis  (ICD10-I70.0). Critical Value/emergent results were called by telephone at the time of interpretation on 05/31/2018 at 11:33 pm to Dr. Nita Sickle , who verbally acknowledged these results. Electronically Signed   By: Obie Dredge M.D.   On: 05/31/2018 23:35   US Venous Img Lower Bilateral  Result Date: 06/01/2018 CLINICAL DATA:  Pulmonary embolus.  Edema. EXAM: BILATERAL LOWER EXTREMITY VENOUS DOPPLER ULTRASOUND TECHNIQUE: Gray-scale sonography with compression, as well as color and duplex ultrasound, were performed to evaluate the deep venous system from the level of the common femoral vein through the popliteal and proximal calf veins. COMPARISON:  None FINDINGS: On the left, normal compressibility of the common femoral, superficial femoral, and popliteal veins, as well as the proximal calf veins. No filling defects to suggest DVT on grayscale or color Doppler imaging. Doppler waveforms show normal direction of venous flow, normal respiratory phasicity and response to augmentation. On the right, incompressible occlusive echogenic thrombus involving right peroneal and popliteal veins. Centrally, the femoral, common femoral, and deep femoral veins remain widely patent. Saphenofemoral junction patent. IMPRESSION: 1. Occlusive RIGHT peroneal and popliteal DVT. 2. Negative for LEFT lower extremity DVT. Electronically Signed   By: Corlis Leak M.D.   On: 06/01/2018 14:13     ASSESSMENT AND PLAN:   72 year old female with past medical history of diabetes, hypertension, history of frequent falls, CHF, atrial fibrillation who presented to the hospital after a fall and complaining of some right-sided pleuritic chest pain and underwent a CT chest which was positive for a submassive pulmonary embolism.  1.  Pulmonary embolism-this is a provoked pulmonary embolism given her poor mobility and frequent falls. - This is the cause of patient shortness of breath and pleuritic chest pain.   -Dopplers of lower  extremities were positive for right-sided DVT which was small.  Discussed with the surgery and no plans for thrombolytics or IVC filter for now.  Patient  is tolerating IV heparin well and will switch to Xarelto today. - No further hypoxemia, shortness of breath or hemoptysis.  2.  Elevated troponin-likely in the setting of supply demand ischemia from underlying pulmonary embolism.  3.  History of chronic atrial fibrillation-rate controlled.  Continue sotalol.  Patient was not on anticoagulation given her high fall risk.  4.  GERD-continue Protonix.  5.  Diabetes-blood sugar stable.  Continue sliding scale insulin.  6.  Hyperlipidemia-continue Crestor.  7.  Depression-continue Zoloft.  Discussed plan of care with the patient's son over the phone.  His son said he is concerned about her going back to the group home due to her frequency of falling.  We will get physical therapy consult to assess her mobility.  Patient is stable and cleared to work with physical therapy from the medical perspective.  Discussed with social work.   All the records are reviewed and case discussed with Care Management/Social Worker. Management plans discussed with the patient, family and they are in agreement.  CODE STATUS: Full code  DVT Prophylaxis: Hep. gtt  TOTAL TIME TAKING CARE OF THIS PATIENT: 30 minutes.   POSSIBLE D/C IN 1-2 DAYS, DEPENDING ON CLINICAL CONDITION.   Houston Siren M.D on 06/02/2018 at 2:58 PM  Between 7am to 6pm - Pager - [phone]  After 6pm go to [email or web address] - Social research officer, government  [facility] [place] [facility]  Office  [phone]  CC: Primary care physician; Margaretann Loveless, MD

## 2018-06-02 NOTE — Progress Notes (Signed)
ANTICOAGULATION CONSULT NOTE   Pharmacy Consult for Heparin  Indication: pulmonary embolus  Allergies  Allergen Reactions  . Peanuts [Peanut Oil] Shortness Of Breath  . Lasix [Furosemide] Swelling    Tongue swelling  . Lisinopril Swelling    Tongue swelling    Patient Measurements: Height: 5\' 7"  (170.2 cm) Weight: 191 lb 4.8 oz (86.8 kg) IBW/kg (Calculated) : 61.6 Heparin Dosing Weight: 84.2 kg   Vital Signs: Temp: 98 F (36.7 C) (07/01 0511) Temp Source: Oral (07/01 0511) BP: 113/53 (07/01 0511) Pulse Rate: 72 (07/01 0511)  Labs: Recent Labs    05/31/18 2140 05/31/18 2337 06/01/18 0153 06/01/18 0738 06/01/18 1019 06/01/18 1450 06/01/18 2139 06/02/18 0524  HGB 13.4  --  12.4  --   --   --   --  12.0  HCT 41.9  --  38.9  --   --   --   --  36.9  PLT 240  --  229  --   --   --   --  214  APTT  --  27  --   --   --   --   --   --   LABPROT  --  14.8  --   --   --   --   --   --   INR  --  1.17  --   --   --   --   --   --   HEPARINUNFRC  --   --   --   --  1.41*  --  0.89* 0.62  CREATININE 1.18*  --  1.09*  --   --   --   --   --   TROPONINI 0.31*  --  0.77* 0.67*  --  0.61*  --   --     Estimated Creatinine Clearance: 52.8 mL/min (A) (by C-G formula based on SCr of 1.09 mg/dL (H)).   Medical History: Past Medical History:  Diagnosis Date  . Arrhythmia    atrial fibrillation  . CHF (congestive heart failure) (HCC)   . Diabetes mellitus without complication (HCC)   . Diverticulitis   . Frequent falls   . Hypertension     Medications:  Medications Prior to Admission  Medication Sig Dispense Refill Last Dose  . ASPIRIN LOW DOSE 81 MG EC tablet Take 1 tablet by mouth daily.   unknown at unknown  . Calcium Carbonate-Vitamin D3 (CALCIUM 600/VITAMIN D) 600-400 MG-UNIT TABS Take 1 tablet by mouth 2 (two) times daily.   unknown at unknown  . cetirizine (ZYRTEC) 10 MG tablet Take 10 mg by mouth at bedtime.    unknown at unknown  . ferrous sulfate 325 (65 FE)  MG EC tablet Take 325 mg by mouth daily.   unknown at unknown  . fesoterodine (TOVIAZ) 8 MG TB24 tablet Take 8 mg by mouth daily.   unknown at unknown  . fluorometholone (FML) 0.1 % ophthalmic suspension Place 1 drop into the right eye 2 (two) times daily.   unknown at unknown  . fluticasone (FLONASE) 50 MCG/ACT nasal spray Place 2 sprays into both nostrils daily.   unknown at unknown  . furosemide (LASIX) 40 MG tablet Take 1 tablet (40 mg total) by mouth daily. 30 tablet 1 unknown at unknown  . glimepiride (AMARYL) 2 MG tablet Take 2 mg by mouth 2 (two) times daily.   unknown at unknown  . hydrALAZINE (APRESOLINE) 25 MG tablet Take 25 mg by mouth 2 (two)  times daily.   unknown at unknown  . ipratropium (ATROVENT) 0.03 % nasal spray Place 2 sprays into both nostrils 3 (three) times daily as needed for rhinitis.   unknown at unknown  . NOVOLOG FLEXPEN 100 UNIT/ML FlexPen Inject 0-10 Units into the skin 3 (three) times daily with meals. <60 Call MD 151-200 - 2 units 201-250 4 units 251-300 - 6 units 301-350 - 8 units 351-400 - 10 units >400 Call MD   unknown at unknown  . omeprazole (PRILOSEC) 40 MG capsule Take 1 capsule by mouth daily.   unknown at unknown  . potassium chloride (K-DUR,KLOR-CON) 10 MEQ tablet Take 10 mEq by mouth daily.   unknown at unknown  . prednisoLONE acetate (PRED FORTE) 1 % ophthalmic suspension Place 1 drop into the right eye 2 (two) times daily.   unknown at unknown  . rosuvastatin (CRESTOR) 20 MG tablet Take 20 mg by mouth daily.   unknown at unknown  . sertraline (ZOLOFT) 100 MG tablet Take 100 mg by mouth daily.   unknown at unknown  . sotalol AF (BETAPACE AF) 80 MG TABS tablet Take 80 mg by mouth 2 (two) times daily.   unknown at unknown  . spironolactone (ALDACTONE) 50 MG tablet Take 50 mg by mouth daily.   unknown at unknown  . valACYclovir (VALTREX) 1000 MG tablet Take 1,000 mg by mouth daily.   unknown at unknown  . cephALEXin (KEFLEX) 500 MG capsule Take 1  capsule (500 mg total) by mouth 3 (three) times daily. (Patient not taking: Reported on 05/31/2018) 30 capsule 0 Completed Course at Unknown time  . insulin aspart (NOVOLOG) 100 UNIT/ML injection 1-150 - no coverage.  151-200 - 2 units 201-250 4 units 251-300 - 6 units 301-350 - 8 units 351-400 - 10 units and call MD. (Patient not taking: Reported on 05/31/2018) 10 mL 11 Not Taking at Unknown time  . traMADol (ULTRAM) 50 MG tablet Take 1 tablet (50 mg total) by mouth 2 (two) times daily as needed. (Patient not taking: Reported on 06/01/2018) 10 tablet 0 Not Taking at Unknown time    Assessment: Pharmacy consulted to dose heparin in this 72 year old female admitted with PE.  No prior anticoag noted ,  CrCl = 50.8 ml/min  Goal of Therapy:  Heparin level 0.3-0.7 units/ml Monitor platelets by anticoagulation protocol: Yes   Plan:  Give 5000 units bolus x 1 Start heparin infusion at 1500 units/hr Check anti-Xa level in 8 hours and daily while on heparin Continue to monitor H&H and platelets   6/30 HL@ 1019= 1.41. Will hold heparin drip x 1 hour and resume at lower rate of 1350 units/hr. Will recheck HL in 8 hrs. Spoke with floor RN and pt in Ultrasound. Called Ultrasound to have them stop Heparin drip.  6/30 HL @ 2139  =0.89. Level is still supratherapeutic. Will adjust infusion to 1200u/hr. Will recheck HL in 8 hours. CBC with AM labs per protocol.   7/01 HL @ 0524 = 0.62   Will continue pt on current rate and recheck HL in 8 hrs on 7/1 @ 1300.   Scherrie Gerlachobbins,Ean Gettel D, PharmD Clinical Pharmacist 06/02/2018 6:57 AM

## 2018-06-02 NOTE — Care Management (Signed)
Patient from [facility].  Current need for oxygen is acute.  Will need home 02 assessment prior to discharge.  On heparin drip for dvt and pulmonary embolus.  Vascuar consulted and no recommend procedures.  Anticipate discharge on  Xarelto.

## 2018-06-02 NOTE — Clinical Social Work Note (Signed)
CSW was informed that patient is from [facility], CSW contacted [facility] awaiting PT evaluation, and assesment by ALF.  Ervin KnackEric R. Amando Ishikawa, MSW, Theresia MajorsLCSWA [phone]  06/02/2018 5:49 PM

## 2018-06-02 NOTE — Progress Notes (Signed)
ANTICOAGULATION CONSULT NOTE - Initial Consult  Pharmacy Consult for Xarelto Indication: PE  Allergies  Allergen Reactions  . Peanuts [Peanut Oil] Shortness Of Breath  . Lasix [Furosemide] Swelling    Tongue swelling  . Lisinopril Swelling    Tongue swelling    Patient Measurements: Height: 5\' 7"  (170.2 cm) Weight: 191 lb 4.8 oz (86.8 kg) IBW/kg (Calculated) : 61.6 Heparin Dosing Weight:   Vital Signs: Temp: 98 F (36.7 C) (07/01 0511) Temp Source: Oral (07/01 0511) BP: 110/46 (07/01 0957) Pulse Rate: 70 (07/01 0957)  Labs: Recent Labs    05/31/18 2140 05/31/18 2337 06/01/18 0153 06/01/18 0738 06/01/18 1019 06/01/18 1450 06/01/18 2139 06/02/18 0524  HGB 13.4  --  12.4  --   --   --   --  12.0  HCT 41.9  --  38.9  --   --   --   --  36.9  PLT 240  --  229  --   --   --   --  214  APTT  --  27  --   --   --   --   --   --   LABPROT  --  14.8  --   --   --   --   --   --   INR  --  1.17  --   --   --   --   --   --   HEPARINUNFRC  --   --   --   --  1.41*  --  0.89* 0.62  CREATININE 1.18*  --  1.09*  --   --   --   --   --   TROPONINI 0.31*  --  0.77* 0.67*  --  0.61*  --   --     Estimated Creatinine Clearance: 52.8 mL/min (A) (by C-G formula based on SCr of 1.09 mg/dL (H)).   Medical History: Past Medical History:  Diagnosis Date  . Arrhythmia    atrial fibrillation  . CHF (congestive heart failure) (HCC)   . Diabetes mellitus without complication (HCC)   . Diverticulitis   . Frequent falls   . Hypertension     Medications:  Infusions:  . heparin 1,200 Units/hr (06/01/18 2221)    Assessment: 71 yof here with fall and noted to have saddle PE. Patient has been maintained on UFH but now pharmacy consulted to dose Xarelto for PE.   Goal of Therapy:   Monitor platelets by anticoagulation protocol: Yes   Plan:  Stop heparin infusion and give Xarelto at the same time. Xarelto 15 mg po BID x 21 days followed by Xarelto 20 mg po daily with supper.    Carola FrostNathan A Brynlea Spindler, Pharm.D., BCPS Clinical Pharmacist 06/02/2018,10:52 AM

## 2018-06-03 LAB — GLUCOSE, CAPILLARY
GLUCOSE-CAPILLARY: 111 mg/dL — AB (ref 70–99)
GLUCOSE-CAPILLARY: 122 mg/dL — AB (ref 70–99)
Glucose-Capillary: 123 mg/dL — ABNORMAL HIGH (ref 70–99)
Glucose-Capillary: 122 mg/dL — ABNORMAL HIGH (ref 70–99)

## 2018-06-03 MED ORDER — SODIUM CHLORIDE 0.9 % IV BOLUS
500.0000 mL | Freq: Once | INTRAVENOUS | Status: AC
  Administered 2018-06-03: 500 mL via INTRAVENOUS

## 2018-06-03 MED ORDER — DOCUSATE SODIUM 100 MG PO CAPS
100.0000 mg | ORAL_CAPSULE | Freq: Two times a day (BID) | ORAL | Status: DC
  Administered 2018-06-03 – 2018-06-04 (×3): 100 mg via ORAL
  Filled 2018-06-03 (×3): qty 1

## 2018-06-03 MED ORDER — SENNOSIDES-DOCUSATE SODIUM 8.6-50 MG PO TABS: 1.0000 | ORAL_TABLET | Freq: Every evening | ORAL | Status: DC | PRN

## 2018-06-03 MED ORDER — MAGNESIUM HYDROXIDE 400 MG/5ML PO SUSP: 30.0000 mL | Freq: Every day | ORAL | Status: DC | PRN

## 2018-06-03 NOTE — Evaluation (Signed)
Physical Therapy Evaluation Patient Details Name: Virginia CreeGlenda C Barnes MRN: [id number] DOB: 1946-04-01 Today's Date: 06/03/2018   History of Present Illness  presented to ER secondary to fall, SOB and chest discomfort; admitted with acute central saddle PE with embolic areas involving all lobes of both lungs and small DVT to R peroneal and popliteal arteries.  Also noted with elevated troponin (peak 0.77), attributed to demand ischemia per chart.  Clinical Impression  Upon evaluation, patient alert and oriented; follows commands (very flat affect, masked expressions, hypophonic speech) and demonstrates good effort with mobility tasks.  Bilat UE/LE strength and ROM grossly symmetrical; globally weak, but functional for basic transfers and gait.  Currently requiring min/mod assist for bed mobility; heavy mod assist with RW for sit/stand, basic transfers and very short-distance gait (5'x2).  Heavy posterior trunk lean, poor balance/righting reactions, very festinating gait pattern; requires constant hands-on assist for safety/fall prevention at all times.  Very parkinson-like in appearance and presentation. Would benefit from skilled PT to address above deficits and promote optimal return to PLOF; recommend transition to STR upon discharge from acute hospitalization.   SaO2 on room air at rest = 91% SaO2 on room air while ambulating = 86% SaO2 on 2.5 liters of O2 while ambulating = 92%     Follow Up Recommendations SNF    Equipment Recommendations       Recommendations for Other Services       Precautions / Restrictions Precautions Precautions: Fall Restrictions Weight Bearing Restrictions: No      Mobility  Bed Mobility Overal bed mobility: Needs Assistance Bed Mobility: Supine to Sit     Supine to sit: Min assist;Mod assist     General bed mobility comments: very bradykinetic movement patterns; hand-over-hand guidance throughout movement transition  Transfers Overall transfer  level: Needs assistance Equipment used: Rolling walker (2 wheeled) Transfers: Sit to/from Stand Sit to Stand: Mod assist         General transfer comment: very heavy mod assist for forward trunk lean, anterior weight translation and lift off  Ambulation/Gait Ambulation/Gait assistance: Mod assist Gait Distance (Feet): 5 Feet(x2) Assistive device: Rolling walker (2 wheeled)       General Gait Details: heavy posterior trunk lean with limited balance/righting reactions; hands-on assist at all times to prevent posterior LOB.  Very shoft, shuffling steps (almost festinating in appearance).  Extremely high fall risk.  Stairs            Wheelchair Mobility    Modified Rankin (Stroke Patients Only)       Balance Overall balance assessment: Needs assistance Sitting-balance support: No upper extremity supported;Feet supported Sitting balance-Leahy Scale: Fair     Standing balance support: Bilateral upper extremity supported Standing balance-Leahy Scale: Poor                               Pertinent Vitals/Pain Pain Assessment: No/denies pain    Home Living Family/patient expects to be discharged to:: Assisted living     Type of Home: Assisted living       Home Layout: One level Home Equipment: Walker - 4 wheels      Prior Function Level of Independence: Needs assistance         Comments: Mod indep with 4WRW for limited household distances, manual WC to/from dining hall; no home O2; does endorse multiple falls in recent weeks ("almost 16")     Hand Dominance  Extremity/Trunk Assessment   Upper Extremity Assessment Upper Extremity Assessment: Overall WFL for tasks assessed    Lower Extremity Assessment Lower Extremity Assessment: Generalized weakness(grossly at least 4-/5 throughout)    Cervical / Trunk Assessment Cervical / Trunk Assessment: (limited truncal dissociation)  Communication   Communication: (very soft spoken, low  phonation)  Cognition Arousal/Alertness: Awake/alert Behavior During Therapy: WFL for tasks assessed/performed Overall Cognitive Status: Within Functional Limits for tasks assessed                                        General Comments      Exercises Other Exercises Other Exercises: Toilet transfer, SPT with RW, mod assist for balance/fall prevention; dep of second person for clothing management and hygiene.   Assessment/Plan    PT Assessment Patient needs continued PT services  PT Problem List Decreased strength;Decreased range of motion;Decreased activity tolerance;Decreased balance;Decreased mobility;Decreased coordination;Decreased safety awareness;Decreased knowledge of precautions;Decreased knowledge of use of DME;Cardiopulmonary status limiting activity       PT Treatment Interventions DME instruction;Gait training;Functional mobility training;Therapeutic activities;Therapeutic exercise;Balance training;Patient/family education    PT Goals (Current goals can be found in the Care Plan section)  Acute Rehab PT Goals Patient Stated Goal: to try to have a bowel movement PT Goal Formulation: With patient Time For Goal Achievement: 06/17/18 Potential to Achieve Goals: Fair    Frequency Min 2X/week   Barriers to discharge Decreased caregiver support      Co-evaluation               AM-PAC PT "6 Clicks" Daily Activity  Outcome Measure Difficulty turning over in bed (including adjusting bedclothes, sheets and blankets)?: Unable Difficulty moving from lying on back to sitting on the side of the bed? : Unable Difficulty sitting down on and standing up from a chair with arms (e.g., wheelchair, bedside commode, etc,.)?: Unable Help needed moving to and from a bed to chair (including a wheelchair)?: A Lot Help needed walking in hospital room?: A Lot Help needed climbing 3-5 steps with a railing? : Total 6 Click Score: 8    End of Session Equipment  Utilized During Treatment: Gait belt Activity Tolerance: Patient tolerated treatment well Patient left: in chair;with call bell/phone within reach;with chair alarm set Nurse Communication: Mobility status PT Visit Diagnosis: Muscle weakness (generalized) (M62.81);Unsteadiness on feet (R26.81);Repeated falls (R29.6);Difficulty in walking, not elsewhere classified (R26.2)    Time: 1610-9604 PT Time Calculation (min) (ACUTE ONLY): 31 min   Charges:   PT Evaluation $PT Eval Moderate Complexity: 1 Mod PT Treatments $Therapeutic Activity: 23-37 mins   PT G Codes:        Jonathin Heinicke H. Manson Passey, PT, DPT, NCS 06/03/18, 12:15 PM [phone]

## 2018-06-03 NOTE — [place] FL2 (Signed)
[place] MEDICAID FL2 LEVEL OF CARE SCREENING TOOL     IDENTIFICATION  Patient Name: Virginia CreeGlenda C Freshour Birthdate: 1946/10/24 Sex: female Admission Date (Current Location): 05/31/2018  [place]  [place] [id number] Q Facility and Address:  [facility], [address], [place], [place] [address]      Provider Number: [id number]  Attending Physician Name and Address:  Houston SirenSainani, Vivek J, MD  Relative Name and Phone Number:  Leverne HumblesMarshall [facility](Son) [phone]    Current Level of Care: Hospital Recommended Level of Care: Skilled Nursing Facility Prior Approval Number:    Date Approved/Denied:   PASRR Number: [id number] [id number]  Discharge Plan: SNF    Current Diagnoses: Patient Active Problem List   Diagnosis Date Noted  . 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 Chronic diastolic heart failure (HCC) 02/17/2018  . HTN (hypertension) 02/17/2018  . PAF (paroxysmal atrial fibrillation) (HCC) 02/17/2018  . Lymphedema 02/17/2018  . Diabetes (HCC) 02/17/2018  . Sepsis (HCC) 01/30/2018  . Hypoglycemia 01/12/2018  . Knee pain 05/17/2017    Orientation RESPIRATION BLADDER Height & Weight     Self, Time, Situation, Place  O2(2L) Incontinent Weight: 191 lb 4.8 oz (86.8 kg) Height:  5\' 7"  (170.2 cm)  BEHAVIORAL SYMPTOMS/MOOD NEUROLOGICAL BOWEL NUTRITION STATUS      Continent Diet(Heart Health/Carb Modified)  AMBULATORY STATUS COMMUNICATION OF NEEDS Skin   Limited Assist Verbally Normal                       Personal Care Assistance Level of Assistance  Bathing, Feeding, Dressing Bathing Assistance: Limited assistance Feeding assistance: Limited assistance Dressing Assistance: Limited assistance     Functional Limitations Info  Sight, Hearing, Speech Sight Info: Adequate Hearing Info: Adequate Speech Info: Adequate    SPECIAL CARE FACTORS FREQUENCY  PT (By licensed PT), OT (By licensed OT)     PT Frequency: 5x a week OT  Frequency: 5x a week            Contractures Contractures Info: Not present    Additional Factors Info  Code Status, Allergies, Psychotropic, Insulin Sliding Scale Code Status Info: Full Code Allergies Info: PEANUTS PEANUT OIL, LASIX FUROSEMIDE, LISINOPRIL  Psychotropic Info: sertraline (ZOLOFT) tablet 100 mg  Insulin Sliding Scale Info: insulin aspart (novoLOG) injection 0-9 Units 3 times a day with meals       Current Medications (06/03/2018):  This is the current hospital active medication list Current Facility-Administered Medications  Medication Dose Route Frequency Provider Last Rate Last Dose  . acetaminophen (TYLENOL) tablet 650 mg  650 mg Oral Q6H PRN Oralia ManisWillis, David, MD       Or  . acetaminophen (TYLENOL) suppository 650 mg  650 mg Rectal Q6H PRN Oralia ManisWillis, David, MD      . docusate sodium (COLACE) capsule 100 mg  100 mg Oral BID Houston SirenSainani, Vivek J, MD   100 mg at 06/03/18 1253  . insulin aspart (novoLOG) injection 0-9 Units  0-9 Units Subcutaneous TID AC & HS Houston SirenSainani, Vivek J, MD   1 Units at 06/03/18 1253  . magnesium hydroxide (MILK OF MAGNESIA) suspension 30 mL  30 mL Oral Daily PRN Houston SirenSainani, Vivek J, MD      . ondansetron [facility](ZOFRAN) tablet 4 mg  4 mg Oral Q6H PRN Oralia ManisWillis, David, MD       Or  . ondansetron [facility](ZOFRAN) injection 4 mg  4 mg Intravenous Q6H PRN Oralia ManisWillis, David, MD      . oxyCODONE (Oxy  IR/ROXICODONE) immediate release tablet 5 mg  5 mg Oral Q4H PRN Oralia Manis, MD   5 mg at 06/01/18 1656  . pantoprazole (PROTONIX) EC tablet 40 mg  40 mg Oral Daily Oralia Manis, MD   40 mg at 06/03/18 1026  . prednisoLONE acetate (PRED FORTE) 1 % ophthalmic suspension 1 drop  1 drop Right Eye BID Oralia Manis, MD   1 drop at 06/03/18 1025  . Rivaroxaban (XARELTO) tablet 15 mg  15 mg Oral BID Houston Siren, MD   15 mg at 06/03/18 1026   Followed by  . [START ON 06/23/2018] rivaroxaban (XARELTO) tablet 20 mg  20 mg Oral Q supper Houston Siren, MD      . rosuvastatin (CRESTOR)  tablet 20 mg  20 mg Oral Daily Oralia Manis, MD   20 mg at 06/03/18 1026  . senna-docusate (Senokot-S) tablet 1 tablet  1 tablet Oral QHS PRN Houston Siren, MD      . sertraline (ZOLOFT) tablet 100 mg  100 mg Oral Daily Oralia Manis, MD   100 mg at 06/03/18 1026  . sodium chloride flush (NS) 0.9 % injection 3 mL  3 mL Intravenous Q12H Houston Siren, MD   3 mL at 06/03/18 1027  . sotalol (BETAPACE) tablet 80 mg  80 mg Oral BID Oralia Manis, MD   80 mg at 06/03/18 1026     Discharge Medications: Please see discharge summary for a list of discharge medications.  Relevant Imaging Results:  Relevant Lab Results:   Additional Information ZO#[id number]  Darleene Cleaver, LCSWA

## 2018-06-03 NOTE — Progress Notes (Signed)
[facility] [place] at [facility]   PATIENT NAME: Virginia Barnes    MR#:  [id number]  DATE OF BIRTH:  07-15-1946  SUBJECTIVE:   Chest pain has resolved, no hypoxia.  Patient is clinically doing well.  Physical therapy recommended short-term rehab.  REVIEW OF SYSTEMS:    Review of Systems  Constitutional: Negative for chills and fever.  HENT: Negative for congestion and tinnitus.   Eyes: Negative for blurred vision and double vision.  Respiratory: Negative for cough, shortness of breath and wheezing.   Cardiovascular: Negative for chest pain, orthopnea and PND.  Gastrointestinal: Negative for abdominal pain, diarrhea, nausea and vomiting.  Genitourinary: Negative for dysuria and hematuria.  Neurological: Negative for dizziness, sensory change and focal weakness.  All other systems reviewed and are negative.   Nutrition: Carb control/heart Healthy Tolerating Diet: Yes Tolerating PT: Eval noted.    DRUG ALLERGIES:   Allergies  Allergen Reactions  . Peanuts [Peanut Oil] Shortness Of Breath  . Lasix [Furosemide] Swelling    Tongue swelling  . Lisinopril Swelling    Tongue swelling    VITALS:  Blood pressure 130/67, pulse 62, temperature 97.7 F (36.5 C), temperature source Oral, resp. rate 16, height 5\' 7"  (1.702 m), weight 86.8 kg (191 lb 4.8 oz), SpO2 100 %.  PHYSICAL EXAMINATION:   Physical Exam  GENERAL:  72 y.o.-year-old patient lying in bed in no acute distress.  EYES: Pupils equal, round, reactive to light and accommodation. No scleral icterus. Extraocular muscles intact.  HEENT: Head atraumatic, normocephalic. Oropharynx and nasopharynx clear.  NECK:  Supple, no jugular venous distention. No thyroid enlargement, no tenderness.  LUNGS: Normal breath sounds bilaterally, no wheezing, rales, rhonchi. No use of accessory muscles of respiration.  CARDIOVASCULAR: S1, S2 normal. No murmurs, rubs, or gallops.  ABDOMEN: Soft, nontender, nondistended.  Bowel sounds present. No organomegaly or mass.  EXTREMITIES: No cyanosis, clubbing or edema b/l.    NEUROLOGIC: Cranial nerves II through XII are intact. No focal Motor or sensory deficits b/l.  Globally weak. PSYCHIATRIC: The patient is alert and oriented x 2.  SKIN: No obvious rash, lesion, or ulcer.    LABORATORY PANEL:   CBC Recent Labs  Lab 06/02/18 0524  WBC 7.5  HGB 12.0  HCT 36.9  PLT 214   ------------------------------------------------------------------------------------------------------------------  Chemistries  Recent Labs  Lab 06/01/18 0153  NA 135  K 4.8  CL 106  CO2 22  GLUCOSE 180*  BUN 18  CREATININE 1.09*  CALCIUM 8.7*   ------------------------------------------------------------------------------------------------------------------  Cardiac Enzymes Recent Labs  Lab 06/01/18 1450  TROPONINI 0.61*   ------------------------------------------------------------------------------------------------------------------  RADIOLOGY:  No results found.   ASSESSMENT AND PLAN:   72 year old female with past medical history of diabetes, hypertension, history of frequent falls, CHF, atrial fibrillation who presented to the hospital after a fall and complaining of some right-sided pleuritic chest pain and underwent a CT chest which was positive for a submassive pulmonary embolism.  1.  Pulmonary embolism-this is a provoked pulmonary embolism given her poor mobility and frequent falls. - This was the cause of patient shortness of breath and pleuritic chest pain.   -Dopplers of lower extremities were positive for right-sided DVT which are small.  Discussed with Vascuular surgery and no plans for thrombolytics or IVC filter for now.   - cont. Xarelto and tolerating it well.  - No further hypoxemia, shortness of breath or hemoptysis.  2.  Elevated troponin-likely in the setting of supply demand ischemia from underlying pulmonary  embolism. - no acute symptoms  now and CP resolved.   3.  History of chronic atrial fibrillation-rate controlled.  Continue sotalol.  Patient not on anticoagulation given her high fall risk.  4.  GERD-continue Protonix.  5.  Diabetes-blood sugar stable.  Continue sliding scale insulin.  6.  Hyperlipidemia-continue Crestor.  7.  Depression-continue Zoloft.  Due to frequent falls, physical therapy consult obtained and they recommend short-term rehab.  Social work has been notified.  They have started bed search.   All the records are reviewed and case discussed with Care Management/Social Worker. Management plans discussed with the patient, family and they are in agreement.  CODE STATUS: Full code  DVT Prophylaxis: Hep. gtt  TOTAL TIME TAKING CARE OF THIS PATIENT: 30 minutes.   POSSIBLE D/C IN 1-2 DAYS, DEPENDING ON CLINICAL CONDITION.   Houston SirenSAINANI,Courtnee Myer J M.D on 06/03/2018 at 4:30 PM  Between 7am to 6pm - Pager - [phone]  After 6pm go to [email or web address] - Social research officer, governmentpassword EPAS [facility]  [facility] [place] [facility]  Office  [phone]  CC: Primary care physician; Margaretann LovelessKhan, Neelam S, MD

## 2018-06-03 NOTE — Clinical Social Work Note (Addendum)
CSW spoke with patient and she would [facility], to continue with home health with [facility], however she is agreeing to go to SNF for short term rehab.  CSW has faxed clinical information to patient's insurance company awaiting insurance authorization.  CSW spoke to patient's son [name] per her request and he would like her to go to SNF for short term rehab again if possible.  CSW explained process and how insurance will pay for her stay.  CSW was given permission by patient and her son begin bed search in [place].  Patient and son prefer [facility] if bed is available.  CSW to continue to follow patient's progress throughout discharge planning.  Ervin KnackEric R. Nikitta Sobiech, MSW, Theresia MajorsLCSWA [phone]  06/03/2018 5:24 PM

## 2018-06-04 ENCOUNTER — Other Ambulatory Visit: Payer: Self-pay

## 2018-06-04 LAB — GLUCOSE, CAPILLARY
Glucose-Capillary: 94 mg/dL (ref 70–99)
Glucose-Capillary: 120 mg/dL — ABNORMAL HIGH (ref 70–99)

## 2018-06-04 MED ORDER — RIVAROXABAN 15 MG PO TABS: 15.0000 mg | ORAL_TABLET | Freq: Two times a day (BID) | ORAL | Status: DC

## 2018-06-04 MED ORDER — RIVAROXABAN 20 MG PO TABS: 20.0000 mg | ORAL_TABLET | Freq: Every day | ORAL | Status: DC

## 2018-06-04 NOTE — Discharge Summary (Signed)
[facility] - [place] at [facility]   PATIENT NAME: Virginia Barnes    MR#:  [id number]  DATE OF BIRTH:  [birth year]  DATE OF ADMISSION:  05/31/2018 ADMITTING PHYSICIAN: Oralia Manis, MD  DATE OF DISCHARGE: 06/04/2018  PRIMARY CARE PHYSICIAN: Margaretann Loveless, MD    ADMISSION DIAGNOSIS:  Acute saddle pulmonary embolism with acute cor pulmonale (HCC) [I26.02]  DISCHARGE DIAGNOSIS:  Principal Problem:   Saddle pulmonary embolus (HCC) Active Problems:   Chronic diastolic heart failure (HCC)   HTN (hypertension)   PAF (paroxysmal atrial fibrillation) (HCC)   Diabetes (HCC)   SECONDARY DIAGNOSIS:   Past Medical History:  Diagnosis Date  . Arrhythmia    atrial fibrillation  . CHF (congestive heart failure) (HCC)   . Diabetes mellitus without complication (HCC)   . Diverticulitis   . Frequent falls   . Hypertension     HOSPITAL COURSE:   72 year old female with past medical history of diabetes, hypertension, history of frequent falls, CHF, atrial fibrillation who presented to the hospital after a fall and complaining of some right-sided pleuritic chest pain and underwent a CT chest which was positive for a submassive pulmonary embolism.  1.  Pulmonary embolism-this was a provoked pulmonary embolism given her poor mobility and frequent falls. - This was the cause of patient shortness of breath and pleuritic chest pain.   Patient was in the hospital and started on IV heparin drip.  Dopplers of her lower extremities were obtained which showed a small right sided peroneal and popliteal DVT.  Vascular surgery consult was obtained they did not recommend IVC filter or thrombolytics as patient was not symptomatic with any hypoxemia and was hemodynamically stable.   -Patient tolerated anticoagulation well with IV heparin and was then switched over to oral Xarelto and has tolerated that well.  She is being discharged on the Xarelto presently.s.  2.  Elevated troponin- this  was in the setting of supply demand ischemia from underlying pulmonary embolism. -Patient had no acute cardiac symptoms.  Patient's echocardiogram showed normal ejection fraction with no wall motion abnormalities.  3.  History of chronic atrial fibrillation- this remained rate controlled while in the hospital.  Patient will continue her sotalol.  She was not on long-term anticoagulation given her high fall risk but now that she has a PE/DVT she has been started on Xarelto.  4.  GERD- she will continue Protonix.  5.  Diabetes- BS stable while in the hospital.  -Patient will continue to provide and her NovoLog sliding scale.  6.  Hyperlipidemia- pt. Will continue Crestor.  7.  Depression- pt. Will continue Zoloft.  8.  Chronic diastolic CHF-patient remained euvolemic and compensated while in the hospital.  Patient will continue her Lasix, Aldactone, hydralazine.  Patient came to the hospital from a group home but after being seen by physical therapy they recommended a high level of care and therefore patient is now being discharged to a skilled nursing facility.  DISCHARGE CONDITIONS:   Stable  CONSULTS OBTAINED:    DRUG ALLERGIES:   Allergies  Allergen Reactions  . Peanuts [Peanut Oil] Shortness Of Breath  . Lasix [Furosemide] Swelling    Tongue swelling  . Lisinopril Swelling    Tongue swelling    DISCHARGE MEDICATIONS:   Allergies as of 06/04/2018      Reactions   Peanuts [peanut Oil] Shortness Of Breath   Lasix [furosemide] Swelling   Tongue swelling   Lisinopril Swelling   Tongue swelling  Medication List    STOP taking these medications   cephALEXin 500 MG capsule Commonly known as:  KEFLEX   traMADol 50 MG tablet Commonly known as:  ULTRAM     TAKE these medications   ASPIRIN LOW DOSE 81 MG EC tablet Generic drug:  aspirin Take 1 tablet by mouth daily.   CALCIUM 600/VITAMIN D 600-400 MG-UNIT Tabs Generic drug:  Calcium Carbonate-Vitamin  D3 Take 1 tablet by mouth 2 (two) times daily.   cetirizine 10 MG tablet Commonly known as:  ZYRTEC Take 10 mg by mouth at bedtime.   ferrous sulfate 325 (65 FE) MG EC tablet Take 325 mg by mouth daily.   fesoterodine 8 MG Tb24 tablet Commonly known as:  TOVIAZ Take 8 mg by mouth daily.   fluorometholone 0.1 % ophthalmic suspension Commonly known as:  FML Place 1 drop into the right eye 2 (two) times daily.   fluticasone 50 MCG/ACT nasal spray Commonly known as:  FLONASE Place 2 sprays into both nostrils daily.   furosemide 40 MG tablet Commonly known as:  LASIX Take 1 tablet (40 mg total) by mouth daily.   glimepiride 2 MG tablet Commonly known as:  AMARYL Take 2 mg by mouth 2 (two) times daily.   hydrALAZINE 25 MG tablet Commonly known as:  APRESOLINE Take 25 mg by mouth 2 (two) times daily.   ipratropium 0.03 % nasal spray Commonly known as:  ATROVENT Place 2 sprays into both nostrils 3 (three) times daily as needed for rhinitis.   NOVOLOG FLEXPEN 100 UNIT/ML FlexPen Generic drug:  insulin aspart Inject 0-10 Units into the skin 3 (three) times daily with meals. <60 Call MD 151-200 - 2 units 201-250 4 units 251-300 - 6 units 301-350 - 8 units 351-400 - 10 units >400 Call MD What changed:  Another medication with the same name was removed. Continue taking this medication, and follow the directions you see here.   omeprazole 40 MG capsule Commonly known as:  PRILOSEC Take 1 capsule by mouth daily.   potassium chloride 10 MEQ tablet Commonly known as:  K-DUR,KLOR-CON Take 10 mEq by mouth daily.   prednisoLONE acetate 1 % ophthalmic suspension Commonly known as:  PRED FORTE Place 1 drop into the right eye 2 (two) times daily.   Rivaroxaban 15 MG Tabs tablet Commonly known as:  XARELTO Take 1 tablet (15 mg total) by mouth 2 (two) times daily for 19 days.   rivaroxaban 20 MG Tabs tablet Commonly known as:  XARELTO Take 1 tablet (20 mg total) by mouth  daily with supper. Start taking on:  06/24/2018   rosuvastatin 20 MG tablet Commonly known as:  CRESTOR Take 20 mg by mouth daily.   sertraline 100 MG tablet Commonly known as:  ZOLOFT Take 100 mg by mouth daily.   sotalol AF 80 MG Tabs tablet Commonly known as:  BETAPACE AF Take 80 mg by mouth 2 (two) times daily.   spironolactone 50 MG tablet Commonly known as:  ALDACTONE Take 50 mg by mouth daily.   valACYclovir 1000 MG tablet Commonly known as:  VALTREX Take 1,000 mg by mouth daily.         DISCHARGE INSTRUCTIONS:   DIET:  Cardiac diet and Diabetic diet  DISCHARGE CONDITION:  Stable  ACTIVITY:  Activity as tolerated  OXYGEN:  Home Oxygen: Yes.     Oxygen Delivery: 2 liters/min via Patient connected to nasal cannula oxygen  DISCHARGE LOCATION:  nursing home   If you  experience worsening of your admission symptoms, develop shortness of breath, life threatening emergency, suicidal or homicidal thoughts you must seek medical attention immediately by calling 911 or calling your MD immediately  if symptoms less severe.  You Must read complete instructions/literature along with all the possible adverse reactions/side effects for all the Medicines you take and that have been prescribed to you. Take any new Medicines after you have completely understood and accpet all the possible adverse reactions/side effects.   Please note  You were cared for by a hospitalist during your hospital stay. If you have any questions about your discharge medications or the care you received while you were in the hospital after you are discharged, you can call the unit and asked to speak with the hospitalist on call if the hospitalist that took care of you is not available. Once you are discharged, your primary care physician will handle any further medical issues. Please note that NO REFILLS for any discharge medications will be authorized once you are discharged, as it is imperative that  you return to your primary care physician (or establish a relationship with a primary care physician if you do not have one) for your aftercare needs so that they can reassess your need for medications and monitor your lab values.     Today   No acute events overnight.  No chest pain.  No calf pain.  Hemodynamically stable.  Will discharge to skilled nursing facility today.  VITAL SIGNS:  Blood pressure (!) 109/47, pulse (!) 56, temperature 98.1 F (36.7 C), temperature source Oral, resp. rate 18, height 5\' 7"  (1.702 m), weight 87 kg (191 lb 11.2 oz), SpO2 100 %.  I/O:    Intake/Output Summary (Last 24 hours) at 06/04/2018 1127 Last data filed at 06/04/2018 1120 Gross per 24 hour  Intake 720 ml  Output 800 ml  Net -80 ml    PHYSICAL EXAMINATION:   GENERAL:  72 y.o.-year-old patient lying in bed in no acute distress.  EYES: Pupils equal, round, reactive to light and accommodation. No scleral icterus. Extraocular muscles intact.  HEENT: Head atraumatic, normocephalic. Oropharynx and nasopharynx clear.  NECK:  Supple, no jugular venous distention. No thyroid enlargement, no tenderness.  LUNGS: Normal breath sounds bilaterally, no wheezing, rales, rhonchi. No use of accessory muscles of respiration.  CARDIOVASCULAR: S1, S2 normal. No murmurs, rubs, or gallops.  ABDOMEN: Soft, nontender, nondistended. Bowel sounds present. No organomegaly or mass.  EXTREMITIES: No cyanosis, clubbing or edema b/l.    NEUROLOGIC: Cranial nerves II through XII are intact. No focal Motor or sensory deficits b/l.  Globally.  PSYCHIATRIC: The patient is alert and oriented x 3.  SKIN: No obvious rash, lesion, or ulcer.   DATA REVIEW:   CBC Recent Labs  Lab 06/02/18 0524  WBC 7.5  HGB 12.0  HCT 36.9  PLT 214    Chemistries  Recent Labs  Lab 06/01/18 0153  NA 135  K 4.8  CL 106  CO2 22  GLUCOSE 180*  BUN 18  CREATININE 1.09*  CALCIUM 8.7*    Cardiac Enzymes Recent Labs  Lab  06/01/18 1450  TROPONINI 0.61*    Microbiology Results  Results for orders placed or performed during the hospital encounter of 05/31/18  MRSA PCR Screening     Status: None   Collection Time: 06/01/18  1:53 AM  Result Value Ref Range Status   MRSA by PCR NEGATIVE NEGATIVE Final    Comment:  The GeneXpert MRSA Assay (FDA approved for NASAL specimens only), is one component of a comprehensive MRSA colonization surveillance program. It is not intended to diagnose MRSA infection nor to guide or monitor treatment for MRSA infections. Performed at [facility], [address] [place], [place] [address]     RADIOLOGY:  No results found.    Management plans discussed with the patient, family and they are in agreement.  CODE STATUS:     Code Status Orders  (From admission, onward)        Start     Ordered   06/01/18 0127  Full code  Continuous     06/01/18 0126     TOTAL TIME TAKING CARE OF THIS PATIENT: 40 minutes.    Houston Siren M.D on 06/04/2018 at 11:27 AM  Between 7am to 6pm - Pager - [phone]  After 6pm go to [email or web address] - Social research officer, government  [facility] [place] [facility]  Office  [phone]  CC: Primary care physician; Margaretann Loveless, MD

## 2018-06-04 NOTE — Clinical Social Work Note (Addendum)
CSW received phone call from [employer] insurance company, that she has been approved for [facility] reference number is [id number].  Patient to be d/c'ed today to [facility] room 709.  Patient and family agreeable to plans will transport via ems RN to call report to Konrad DoloresKim Hicks, [phone].  CSW spoke to patient's son Virginia Barnes [phone] who is aware that patient is discharging today.   Virginia Barnes, MSW, Theresia MajorsLCSWA [phone]

## 2018-06-04 NOTE — Progress Notes (Signed)
Called report to nursing facility at this time. All questions answered. Raynald BlendSteven M Coralee Edberg Called EMS for non-emergent patient transport to the facility at this time. Jari FavreSteven M [facility]

## 2018-06-04 NOTE — Discharge Instructions (Signed)
Pulmonary Embolism A pulmonary embolism (PE) is a sudden blockage or decrease of blood flow in one lung or both lungs. Most blockages come from a blood clot that forms in a lower leg, thigh, or arm vein (deep vein thrombosis, DVT) and travels to the lungs. A clot is blood that has thickened into a gel or solid. PE is a dangerous and life-threatening condition that needs to be treated right away. What are the causes? This condition is usually caused by a blood clot that forms in a vein and moves to the lungs. In rare cases, it may be caused by air, fat, part of a tumor, or other tissue that moves through the veins and into the lungs. What increases the risk? The following factors may make you more likely to develop this condition:  Having DVT or a history of DVT.  Being older than age 60.  Personal or family history of blood clots or blood clotting disease.  Major or lengthy surgery.  Orthopedic surgery, especially hip or knee replacement.  Traumatic injury, such as breaking a hip or leg.  Spinal cord injury.  Stroke.  Taking medicines that contain estrogen. These include birth control pills and hormone replacement therapy.  Long-term (chronic) lung or heart disease.  Cancer and chemotherapy.  Having a central venous catheter.  Pregnancy and the period after delivery.  What are the signs or symptoms? Symptoms of this condition usually start suddenly and include:  Shortness of breath while active or at rest.  Coughing or coughing up blood or blood-tinged mucus.  Chest pain that is often worse with deep breaths.  Rapid or irregular heartbeat.  Feeling light-headed or dizzy.  Fainting.  Feeling anxious.  Sweating.  Pain and swelling in a leg. This is a symptom of DVT, which can lead to PE.  How is this diagnosed? This condition may be diagnosed based on:  Your medical history.  A physical exam.  Blood tests to check blood oxygen level and how well your blood  clots, and a D-dimer blood test, which checks your blood for a substance that is released when a blood clot breaks apart.  CT pulmonary angiogram. This test checks blood flow in and around your lungs.  Ventilation-perfusion scan, also called a lung VQ scan. This test measures air flow and blood flow to the lungs.  Ultrasound of the legs to look for blood clots.  How is this treated? Treatment for this conditions depends on many factors, such as the cause of your PE, your risk for bleeding or developing more clots, and other medical conditions you have. Treatment aims to remove, dissolve, or stop blood clots from forming or growing larger. Treatment may include:  Blood thinning medicines (anticoagulants) to stop clots from forming or growing. These medicines may be given as a pill, as an injection, or through an IV tube (infusion).  Medicines that dissolve clots (thrombolytics).  A procedure in which a flexible tube is used to remove a blood clot (embolectomy) or deliver medicine to destroy it (catheter-directed thrombolysis).  A procedure in which a filter is inserted into a large vein that carries blood to the heart (inferior vena cava). This filter (vena cava filter) catches blood clots before they reach the lungs.  Surgery to remove the clot (surgical embolectomy). This is rare.  You may need a combination of immediate, long-term (up to 3 months after diagnosis), and extended (more than 3 months after diagnosis) treatments. Your treatment may continue for several months (maintenance therapy).   You and your health care provider will work together to choose the treatment program that is best for you. Follow these instructions at home: If you are taking an anticoagulant medicine:  Take the medicine every day at the same time each day.  Understand what foods and drugs interact with your medicine.  Understand the side effects of this medicine, including excessive bruising or bleeding. Ask  your health care provider or pharmacist about other side effects. General instructions  Take over-the-counter and prescription medicines only as told by your health care provider.  Anticoagulant medicines may cause side effects, including easy bruising and difficulty stopping bleeding. If you are prescribed an anticoagulant: ? Hold pressure over cuts for longer than usual. ? Tell your dentist and other health care providers that you are taking anticoagulants before you have any procedure that may cause bleeding. ? Avoid contact sports. ? Be extra careful when handling sharp objects. ? Use a soft toothbrush. Floss with waxed dental floss. ? Shave with an electric razor.  Wear a medical alert bracelet or carry a medical alert card that says you have had a PE.  Ask your health care provider when you may return to your normal activities.  Talk with your health care provider about any travel plans. It is important to make sure that you are still able to take your medicine while on trips.  Keep all follow-up visits as told by your health care provider. This is important. How is this prevented? Take these actions to lower your risk of developing another PE:  Exercise regularly. Take frequent walks. For at least 30 minutes every day, engage in: ? Activity that involves moving your arms and legs. ? Activity that encourages good blood flow through your body by increasing your heart rate.  While traveling, drink plenty of water and avoid drinking alcohol. Ask your health care provider if you should wear below-the-knee compression stockings.  Avoid sitting or lying in bed for long periods of time without moving your legs. Exercise your arms and legs every hour during long-distance travel (over 4 hours).  If you are hospitalized or have surgery, ask your health care provider about your risks and what treatments can help prevent blood clots.  Maintain a healthy weight. Ask your health care  provider what weight is healthy for you.  If you are a woman who is over age 35, avoid unnecessary use of medicines that contain estrogen, including birth control pills.  Do not use any products that contain nicotine or tobacco, such as cigarettes and e-cigarettes. This is especially important if you take estrogen medicines. If you need help quitting, ask your health care provider.  See your health care provider for regular checkups. This may include blood tests and ultrasound testing on your legs to check for new blood clots.  Contact a health care provider if:  You missed a dose of your blood thinner medicine. Get help right away if:  You have new or increased pain, swelling, warmth, or redness in an arm or leg.  You have numbness or tingling in an arm or leg.  You have shortness of breath while active or at rest.  You have chest pain.  You have a rapid or irregular heartbeat.  You feel light-headed or dizzy.  You cough up blood.  You have blood in your vomit, stool, or urine.  You have a fever.  You have abdomen (abdominal) pain.  You have a severe fall or head injury.  You have a   severe headache.  You have vision changes.  You cannot move your arms or legs.  You are confused or have memory loss.  You are bleeding for 10 minutes or more, even with strong pressure on the wound. These symptoms may represent a serious problem that is an emergency. Do not wait to see if the symptoms will go away. Get medical help right away. Call your local emergency services (911 in the [place] Do not drive yourself to the hospital. Summary  A pulmonary embolism (PE) is a sudden blockage or decrease of blood flow in one lung or both lungs. PE is a dangerous and life-threatening condition that needs to be treated right away.  Having deep vein thrombosis (DVT) or a history of DVT is the most common risk factor for PE.  Treatments for this condition usually include medicines to thin  your blood (anticoagulants) or medicines to break apart blood clots (thrombolytics).  If you are prescribed blood thinners, it is important to take the medicine every single day at the same time each day.  If you have signs of PE or DVT, call your local emergency services (911 in the [place] This information is not intended to replace advice given to you by your health care provider. Make sure you discuss any questions you have with your health care provider. Document Released: 11/16/2000 Document Revised: 12/22/2016 Document Reviewed: 12/22/2016 Elsevier Interactive Patient Education  2018 Elsevier Inc.  

## 2018-06-04 NOTE — Care Management Important Message (Signed)
Copy of signed IM left with patient in room.  

## 2018-06-04 NOTE — Clinical Social Work Placement (Signed)
   CLINICAL SOCIAL WORK PLACEMENT  NOTE  Date:  06/04/2018  Patient Details  Name: Virginia Barnes MRN: [id number] Date of Birth: 04-Jun-1946  Clinical Social Work is seeking post-discharge placement for this patient at the Skilled  Nursing Facility level of care (*CSW will initial, date and re-position this form in  chart as items are completed):  Yes   Patient/family provided with [facility] Clinical Social Work Department's list of facilities offering this level of care within the geographic area requested by the patient (or if unable, by the patient's family).  Yes   Patient/family informed of their freedom to choose among providers that offer the needed level of care, that participate in Medicare, Medicaid or managed care program needed by the patient, have an available bed and are willing to accept the patient.  Yes   Patient/family informed of [facility]'s ownership interest in [facility] and [facility], as well as of the fact that they are under no obligation to receive care at these facilities.  PASRR submitted to EDS on 06/03/18     PASRR number received on       Existing PASRR number confirmed on 06/03/18     FL2 transmitted to all facilities in geographic area requested by pt/family on 06/03/18     FL2 transmitted to all facilities within larger geographic area on       Patient informed that his/her managed care company has contracts with or will negotiate with certain facilities, including the following:            Patient/family informed of bed offers received.  Patient chooses bed at [facility]     Physician recommends and patient chooses bed at      Patient to be transferred to [facility] on 06/04/18.  Patient to be [facility]     Patient family notified on 06/04/18 of transfer.  Name of family member notified:  Patient's son [name]     PHYSICIAN Please sign FL2, Please prepare  priority discharge summary, including medications     Additional Comment:    _______________________________________________ Darleene CleaverAnterhaus, [name], LCSWA 06/04/2018, 2:36 PM

## 2018-06-21 ENCOUNTER — Emergency Department
Admission: EM | Admit: 2018-06-21 | Discharge: 2018-06-22 | Disposition: A | Discharge status: Home / Self Care | Payer: Medicare HMO | Attending: Emergency Medicine | Admitting: Emergency Medicine

## 2018-06-21 ENCOUNTER — Emergency Department: Payer: Medicare HMO

## 2018-06-21 DIAGNOSIS — Z79899 Other long term (current) drug therapy: Secondary | ICD-10-CM | POA: Diagnosis not present

## 2018-06-21 DIAGNOSIS — Z9660 Presence of unspecified orthopedic joint implant: Secondary | ICD-10-CM | POA: Insufficient documentation

## 2018-06-21 DIAGNOSIS — Y999 Unspecified external cause status: Secondary | ICD-10-CM | POA: Insufficient documentation

## 2018-06-21 DIAGNOSIS — E119 Type 2 diabetes mellitus without complications: Secondary | ICD-10-CM | POA: Insufficient documentation

## 2018-06-21 DIAGNOSIS — R110 Nausea: Secondary | ICD-10-CM | POA: Diagnosis not present

## 2018-06-21 DIAGNOSIS — Z794 Long term (current) use of insulin: Secondary | ICD-10-CM | POA: Insufficient documentation

## 2018-06-21 DIAGNOSIS — M542 Cervicalgia: Secondary | ICD-10-CM | POA: Insufficient documentation

## 2018-06-21 DIAGNOSIS — I5032 Chronic diastolic (congestive) heart failure: Secondary | ICD-10-CM | POA: Diagnosis not present

## 2018-06-21 DIAGNOSIS — Z7982 Long term (current) use of aspirin: Secondary | ICD-10-CM | POA: Insufficient documentation

## 2018-06-21 DIAGNOSIS — S0990XA Unspecified injury of head, initial encounter: Secondary | ICD-10-CM | POA: Insufficient documentation

## 2018-06-21 DIAGNOSIS — W19XXXA Unspecified fall, initial encounter: Secondary | ICD-10-CM | POA: Insufficient documentation

## 2018-06-21 DIAGNOSIS — Y9389 Activity, other specified: Secondary | ICD-10-CM | POA: Insufficient documentation

## 2018-06-21 DIAGNOSIS — I110 Hypertensive heart disease with heart failure: Secondary | ICD-10-CM | POA: Diagnosis not present

## 2018-06-21 DIAGNOSIS — Z87891 Personal history of nicotine dependence: Secondary | ICD-10-CM | POA: Diagnosis not present

## 2018-06-21 DIAGNOSIS — Z91010 Allergy to peanuts: Secondary | ICD-10-CM | POA: Insufficient documentation

## 2018-06-21 DIAGNOSIS — Y92120 Kitchen in nursing home as the place of occurrence of the external cause: Secondary | ICD-10-CM | POA: Insufficient documentation

## 2018-06-21 DIAGNOSIS — Z7901 Long term (current) use of anticoagulants: Secondary | ICD-10-CM | POA: Insufficient documentation

## 2018-06-21 LAB — BASIC METABOLIC PANEL
Anion gap: 10 (ref 5–15)
BUN: 18 mg/dL (ref 8–23)
CALCIUM: 9.5 mg/dL (ref 8.9–10.3)
CHLORIDE: 104 mmol/L (ref 98–111)
CO2: 26 mmol/L (ref 22–32)
CREATININE: 1.13 mg/dL — AB (ref 0.44–1.00)
GFR calc non Af Amer: 47 mL/min — ABNORMAL LOW (ref >60)
GFR, EST AFRICAN AMERICAN: 55 mL/min — AB (ref >60)
Glucose, Bld: 203 mg/dL — ABNORMAL HIGH (ref 70–99)
Potassium: 4.5 mmol/L (ref 3.5–5.1)
SODIUM: 140 mmol/L (ref 135–145)

## 2018-06-21 LAB — CBC
HCT: 40.1 % (ref 35.0–47.0)
Hemoglobin: 12.8 g/dL (ref 12.0–16.0)
MCH: 28.2 pg (ref 26.0–34.0)
MCHC: 31.9 g/dL — AB (ref 32.0–36.0)
MCV: 88.3 fL (ref 80.0–100.0)
Platelets: 295 10*3/uL (ref 150–440)
RBC: 4.54 MIL/uL (ref 3.80–5.20)
RDW: 16.9 % — AB (ref 11.5–14.5)
WBC: 6.8 10*3/uL (ref 3.6–11.0)

## 2018-06-21 NOTE — ED Notes (Signed)
Report called to RoselleJocelyn, Med Tech at [facility]. Pt's son to come and pick up pt.

## 2018-06-21 NOTE — ED Notes (Signed)
Spoke with son who states he will be here in about 45 mins to pick up the patient.

## 2018-06-21 NOTE — ED Notes (Signed)
Called son to see if he was still coming to pick up his mother. Son states he is trying to find someone.

## 2018-06-21 NOTE — ED Notes (Signed)
Gave pt a sandwich tray and diet ginger ale per RN.

## 2018-06-21 NOTE — ED Provider Notes (Signed)
[facility] Emergency Department Provider Note  Time seen: 3:53 PM  I have reviewed the triage vital signs and the nursing notes.   HISTORY  Chief Complaint Fall    HPI Deeann CreeGlenda C Segreto is a 72 y.o. female with a past medical history of atrial fibrillation, CHF, diabetes, hypertension, presents to the emergency department after an unwitnessed fall at her nursing facility.  According to report patient just returned from [facility] 2 days ago.  She states she has been feeling somewhat nauseated for the past 2 days.  Patient states she was trying to get something out of the fridge when she fell backwards.  Believe she hit the back of her head.  Her only complaint is mild neck pain.   Past Medical History:  Diagnosis Date  . Arrhythmia    atrial fibrillation  . CHF (congestive heart failure) (HCC)   . Diabetes mellitus without complication (HCC)   . Diverticulitis   . Frequent falls   . Hypertension     Patient Active Problem List   Diagnosis Date Noted  . 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 Chronic diastolic heart failure (HCC) 02/17/2018  . HTN (hypertension) 02/17/2018  . PAF (paroxysmal atrial fibrillation) (HCC) 02/17/2018  . Lymphedema 02/17/2018  . Diabetes (HCC) 02/17/2018  . Sepsis (HCC) 01/30/2018  . Hypoglycemia 01/12/2018  . Knee pain 05/17/2017    Past Surgical History:  Procedure Laterality Date  . ABDOMINAL HYSTERECTOMY    . CARDIAC CATHETERIZATION Left 04/13/2016   Procedure: Left Heart Cath and Coronary Angiography;  Surgeon: Laurier NancyShaukat A Khan, MD;  Location: [facility] INVASIVE CV LAB;  Service: Cardiovascular;  Laterality: Left;  . JOINT REPLACEMENT      Prior to Admission medications   Medication Sig Start Date End Date Taking? Authorizing Provider  ASPIRIN LOW DOSE 81 MG EC tablet Take 1 tablet by mouth daily. 09/26/15   [provider]  Calcium Carbonate-Vitamin D3 (CALCIUM 600/VITAMIN D) 600-400  MG-UNIT TABS Take 1 tablet by mouth 2 (two) times daily.    [provider]  cetirizine (ZYRTEC) 10 MG tablet Take 10 mg by mouth at bedtime.     [provider]  ferrous sulfate 325 (65 FE) MG EC tablet Take 325 mg by mouth daily.    [provider]  fesoterodine (TOVIAZ) 8 MG TB24 tablet Take 8 mg by mouth daily.    [provider]  fluorometholone (FML) 0.1 % ophthalmic suspension Place 1 drop into the right eye 2 (two) times daily.    [provider]  fluticasone (FLONASE) 50 MCG/ACT nasal spray Place 2 sprays into both nostrils daily.    [provider]  furosemide (LASIX) 40 MG tablet Take 1 tablet (40 mg total) by mouth daily. 02/06/18 02/06/19  Salary, Evelena AsaMontell D, MD  glimepiride (AMARYL) 2 MG tablet Take 2 mg by mouth 2 (two) times daily.    [provider]  hydrALAZINE (APRESOLINE) 25 MG tablet Take 25 mg by mouth 2 (two) times daily.    [provider]  ipratropium (ATROVENT) 0.03 % nasal spray Place 2 sprays into both nostrils 3 (three) times daily as needed for rhinitis.    [provider]  NOVOLOG FLEXPEN 100 UNIT/ML FlexPen Inject 0-10 Units into the skin 3 (three) times daily with meals. <60 Call MD 151-200 - 2 units 201-250 4 units 251-300 - 6 units 301-350 - 8 units 351-400 - 10 units >400 Call MD 05/23/18   [provider]  omeprazole (PRILOSEC) 40 MG capsule Take 1 capsule by mouth daily. 09/26/15   [provider]  potassium chloride (K-DUR,KLOR-CON) 10 MEQ tablet Take 10 mEq by mouth daily. 05/09/18   [provider]  prednisoLONE acetate (PRED FORTE) 1 % ophthalmic suspension Place 1 drop into the right eye 2 (two) times daily.    [provider]  Rivaroxaban (XARELTO) 15 MG TABS tablet Take 1 tablet (15 mg total) by mouth 2 (two) times daily for 19 days. 06/04/18 06/23/18  Houston Siren, MD  rivaroxaban (XARELTO) 20 MG TABS tablet Take 1 tablet (20 mg total) by  mouth daily with supper. 06/24/18 08/23/18  Houston Siren, MD  rosuvastatin (CRESTOR) 20 MG tablet Take 20 mg by mouth daily.    [provider]  sertraline (ZOLOFT) 100 MG tablet Take 100 mg by mouth daily.    [provider]  sotalol AF (BETAPACE AF) 80 MG TABS tablet Take 80 mg by mouth 2 (two) times daily.    [provider]  spironolactone (ALDACTONE) 50 MG tablet Take 50 mg by mouth daily.    [provider]  valACYclovir (VALTREX) 1000 MG tablet Take 1,000 mg by mouth daily.    [provider]    Allergies  Allergen Reactions  . Peanuts [Peanut Oil] Shortness Of Breath  . Lasix [Furosemide] Swelling    Tongue swelling  . Lisinopril Swelling    Tongue swelling    Family History  Problem Relation Age of Onset  . Stomach cancer Sister 28  . Vaginal cancer Sister 32  . Heart disease Father   . Breast cancer Neg Hx     Social History Social History   Tobacco Use  . Smoking status: Former Smoker    Packs/day: 0.50    Years: 15.00    Pack years: 7.50    Types: Cigarettes  . Smokeless tobacco: Never Used  Substance Use Topics  . Alcohol use: No  . Drug use: No    Review of Systems Constitutional: Negative for fever. Cardiovascular: Negative for chest pain. Respiratory: Negative for shortness of breath. Gastrointestinal: Negative for abdominal pain, vomiting Genitourinary: Negative for urinary compaints Musculoskeletal: Mild neck pain Skin: Negative for skin complaints  Neurological: Negative for headache All other ROS negative  ____________________________________________   PHYSICAL EXAM:  Constitutional: Alert and oriented, able to tell me it is 2019. Well appearing and in no distress. Eyes: Normal exam ENT   Head: Normocephalic and atraumatic.   Mouth/Throat: Mucous membranes are moist. Cardiovascular: Normal rate, regular rhythm.  Respiratory: Normal respiratory effort without tachypnea nor  retractions. Breath sounds are clear  Gastrointestinal: Soft and nontender. No distention.   Musculoskeletal: Nontender with normal range of motion in all extremities. No lower extremity tenderness or edema.  No CT or L-spine tenderness Neurologic:  Normal speech and language. No gross focal neurologic deficits Skin:  Skin is warm, dry and intact.  Psychiatric: Mood and affect are normal.  ____________________________________________    EKG  EKG reviewed and interpreted by myself shows sinus rhythm at 81 bpm, narrow QRS, normal axis, slight QTC prolongation, nonspecific ST changes.  ____________________________________________    RADIOLOGY  CTs negative  ____________________________________________   INITIAL IMPRESSION / ASSESSMENT AND PLAN / ED COURSE  Pertinent labs & imaging results that were available during my care of the patient were reviewed by me and considered in my medical decision making (see chart for details).  Patient presents emergency department after an unwitnessed fall  at her nursing facility.  Patient is able to give a good history.  Is oriented.  Only complains of mild neck pain.  Will obtain CT imaging of the head and neck, given the patient's complaint of nausea and her fall today we will check basic labs as well.  Patient agreeable to plan of care.  Overall the patient appears well.  Patient's labs are largely at her baseline, no acute abnormality.  CT scans are negative.  We will discharge with PCP follow-up. ____________________________________________   FINAL CLINICAL IMPRESSION(S) / ED DIAGNOSES  Thresa Ross, MD 06/21/18 2036

## 2018-06-21 NOTE — ED Triage Notes (Signed)
Pt presents via EMS s/p unwitnessed fall. Reports left sided neck pain and generalized abd pain.

## 2018-06-21 NOTE — ED Notes (Signed)
Assisted pt to bathroom

## 2018-06-29 ENCOUNTER — Emergency Department: Payer: Medicare HMO

## 2018-06-29 ENCOUNTER — Other Ambulatory Visit: Payer: Self-pay

## 2018-06-29 ENCOUNTER — Emergency Department
Admission: EM | Admit: 2018-06-29 | Discharge: 2018-06-29 | Disposition: A | Discharge status: Skilled Nursing Facility | Payer: Medicare HMO | Attending: Emergency Medicine | Admitting: Emergency Medicine

## 2018-06-29 DIAGNOSIS — Z79899 Other long term (current) drug therapy: Secondary | ICD-10-CM | POA: Insufficient documentation

## 2018-06-29 DIAGNOSIS — Z794 Long term (current) use of insulin: Secondary | ICD-10-CM | POA: Insufficient documentation

## 2018-06-29 DIAGNOSIS — S0990XA Unspecified injury of head, initial encounter: Secondary | ICD-10-CM | POA: Diagnosis present

## 2018-06-29 DIAGNOSIS — Z91010 Allergy to peanuts: Secondary | ICD-10-CM | POA: Diagnosis not present

## 2018-06-29 DIAGNOSIS — S0003XA Contusion of scalp, initial encounter: Secondary | ICD-10-CM

## 2018-06-29 DIAGNOSIS — Y939 Activity, unspecified: Secondary | ICD-10-CM | POA: Insufficient documentation

## 2018-06-29 DIAGNOSIS — E119 Type 2 diabetes mellitus without complications: Secondary | ICD-10-CM | POA: Diagnosis not present

## 2018-06-29 DIAGNOSIS — Z23 Encounter for immunization: Secondary | ICD-10-CM | POA: Diagnosis not present

## 2018-06-29 DIAGNOSIS — I110 Hypertensive heart disease with heart failure: Secondary | ICD-10-CM | POA: Insufficient documentation

## 2018-06-29 DIAGNOSIS — I5032 Chronic diastolic (congestive) heart failure: Secondary | ICD-10-CM | POA: Insufficient documentation

## 2018-06-29 DIAGNOSIS — W19XXXA Unspecified fall, initial encounter: Secondary | ICD-10-CM

## 2018-06-29 DIAGNOSIS — Y999 Unspecified external cause status: Secondary | ICD-10-CM | POA: Diagnosis not present

## 2018-06-29 DIAGNOSIS — W0110XA Fall on same level from slipping, tripping and stumbling with subsequent striking against unspecified object, initial encounter: Secondary | ICD-10-CM | POA: Insufficient documentation

## 2018-06-29 DIAGNOSIS — Y929 Unspecified place or not applicable: Secondary | ICD-10-CM | POA: Insufficient documentation

## 2018-06-29 MED ORDER — BACITRACIN ZINC 500 UNIT/GM EX OINT
TOPICAL_OINTMENT | Freq: Once | CUTANEOUS | Status: AC
  Administered 2018-06-29: 1 via TOPICAL
  Filled 2018-06-29: qty 0.9

## 2018-06-29 MED ORDER — TETANUS-DIPHTH-ACELL PERTUSSIS 5-2.5-18.5 LF-MCG/0.5 IM SUSP
0.5000 mL | Freq: Once | INTRAMUSCULAR | Status: AC
Start: 2018-06-29 — End: 2018-06-29
  Administered 2018-06-29: 0.5 mL via INTRAMUSCULAR
  Filled 2018-06-29: qty 0.5

## 2018-06-29 NOTE — ED Notes (Signed)
Report called to facility

## 2018-06-29 NOTE — ED Notes (Signed)
No peripheral IV placed this visit.   Discharge instructions reviewed with patient. Questions fielded by this RN. Patient verbalizes understanding of instructions. Patient discharged home in stable condition per stafford. No acute distress noted at time of discharge.   

## 2018-06-29 NOTE — ED Triage Notes (Signed)
Pt arrives from [facility] via [facility] post ground level fall, negative LOC. Pt alert, oriented, superficial lac on posterior head. PERRLA. No distress.

## 2018-06-29 NOTE — ED Notes (Signed)
Placed patient on and off the bed pan. LJA

## 2018-06-29 NOTE — ED Provider Notes (Addendum)
[facility] Emergency Department Provider Note  ____________________________________________  Time seen: Approximately 8:20 PM  I have reviewed the triage vital signs and the nursing notes.   HISTORY  Chief Complaint Fall  Level 5 Caveat: Portions of the History and Physical including HPI and review of systems are unable to be completely obtained due to patient being a poor historian    HPI Virginia Barnes is a 72 y.o. female with a history of CHF diabetes and hypertension and frequent falls who was at her residential facility today when she had a dizziness episode as per her usual causing her to fall back and hit the back of her head on a metal table.  She did not lose consciousness.  Denies any headache or neck pain.  Denies any other complaints at this time.  No other new symptoms, no back pain vision changes paresthesias or weakness.      Past Medical History:  Diagnosis Date  . Arrhythmia    atrial fibrillation  . CHF (congestive heart failure) (HCC)   . Diabetes mellitus without complication (HCC)   . Diverticulitis   . Frequent falls   . Hypertension      Patient Active Problem List   Diagnosis Date Noted  . 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 Chronic diastolic heart failure (HCC) 02/17/2018  . HTN (hypertension) 02/17/2018  . PAF (paroxysmal atrial fibrillation) (HCC) 02/17/2018  . Lymphedema 02/17/2018  . Diabetes (HCC) 02/17/2018  . Sepsis (HCC) 01/30/2018  . Hypoglycemia 01/12/2018  . Knee pain 05/17/2017     Past Surgical History:  Procedure Laterality Date  . ABDOMINAL HYSTERECTOMY    . CARDIAC CATHETERIZATION Left 04/13/2016   Procedure: Left Heart Cath and Coronary Angiography;  Surgeon: Laurier Nancy, MD;  Location: [facility] INVASIVE CV LAB;  Service: Cardiovascular;  Laterality: Left;  . JOINT REPLACEMENT       Prior to Admission medications   Medication Sig Start Date End Date Taking? Authorizing Provider  Calcium  Carbonate-Vitamin D3 (CALCIUM 600/VITAMIN D) 600-400 MG-UNIT TABS Take 1 tablet by mouth 2 (two) times daily.   Yes [provider]  cetirizine (ZYRTEC) 10 MG tablet Take 10 mg by mouth daily.    Yes [provider]  ferrous sulfate 325 (65 FE) MG EC tablet Take 325 mg by mouth daily.   Yes [provider]  fesoterodine (TOVIAZ) 8 MG TB24 tablet Take 8 mg by mouth daily.   Yes [provider]  fluorometholone (FML) 0.1 % ophthalmic suspension Place 1 drop into the right eye 2 (two) times daily.   Yes [provider]  fluticasone (FLONASE) 50 MCG/ACT nasal spray Place 2 sprays into both nostrils daily.   Yes [provider]  furosemide (LASIX) 40 MG tablet Take 1 tablet (40 mg total) by mouth daily. Patient taking differently: Take 20 mg by mouth daily.  02/06/18 02/06/19 Yes Salary, Evelena Asa, MD  hydrALAZINE (APRESOLINE) 25 MG tablet Take 25 mg by mouth 2 (two) times daily.   Yes [provider]  ipratropium (ATROVENT) 0.03 % nasal spray Place 2 sprays into both nostrils 3 (three) times daily as needed for rhinitis.   Yes [provider]  NOVOLOG FLEXPEN 100 UNIT/ML FlexPen Inject 0-10 Units into the skin 3 (three) times daily with meals. <60 Call MD 151-200 - 2 units 201-250 4 units 251-300 - 6 units 301-350 - 8 units 351-400 - 10 units >400 Call MD 05/23/18  Yes [provider]  omeprazole (PRILOSEC) 40 MG capsule Take 1 capsule by mouth daily. 09/26/15  Yes [provider]  potassium chloride (K-DUR,KLOR-CON) 10 MEQ tablet Take 10 mEq by mouth daily. 05/09/18  Yes [provider]  prednisoLONE acetate (PRED FORTE) 1 % ophthalmic suspension Place 1 drop into both eyes 2 (two) times daily.    Yes [provider]  rivaroxaban (XARELTO) 20 MG TABS tablet Take 1 tablet (20 mg total) by mouth daily with supper. Patient taking differently: Take 20 mg by mouth every morning.  06/24/18 08/23/18 Yes  Sainani, Rolly PancakeVivek J, MD  rosuvastatin (CRESTOR) 20 MG tablet Take 20 mg by mouth daily.   Yes [provider]  sertraline (ZOLOFT) 100 MG tablet Take 100 mg by mouth daily.   Yes [provider]  sotalol (BETAPACE) 80 MG tablet Take 80 mg by mouth 2 (two) times daily.   Yes [provider]  spironolactone (ALDACTONE) 25 MG tablet Take 25 mg by mouth daily.    Yes [provider]  valACYclovir (VALTREX) 1000 MG tablet Take 1,000 mg by mouth daily.   Yes [provider]  Rivaroxaban (XARELTO) 15 MG TABS tablet Take 1 tablet (15 mg total) by mouth 2 (two) times daily for 19 days. 06/04/18 06/23/18  Houston SirenSainani, Vivek J, MD     Allergies Peanuts [peanut oil]; Lasix [furosemide]; and Lisinopril   Family History  Problem Relation Age of Onset  . Stomach cancer Sister 3525  . Vaginal cancer Sister 7130  . Heart disease Father   . Breast cancer Neg Hx     Social History Social History   Tobacco Use  . Smoking status: Former Smoker    Packs/day: 0.50    Years: 15.00    Pack years: 7.50    Types: Cigarettes  . Smokeless tobacco: Never Used  Substance Use Topics  . Alcohol use: No  . Drug use: No    Review of Systems  Constitutional:   No fever or chills.  ENT:   No sore throat. No rhinorrhea.  No epistaxis Cardiovascular:   No chest pain or syncope. Respiratory:   No dyspnea or cough. Gastrointestinal:   Negative for abdominal pain, vomiting and diarrhea.  Musculoskeletal:   Negative for focal pain or swelling All other systems reviewed and are negative except as documented above in ROS and HPI.  ____________________________________________   PHYSICAL EXAM:  VITAL SIGNS: ED Triage Vitals  Enc Vitals Group     BP 06/29/18 1719 121/67     Pulse Rate 06/29/18 1719 83     Resp 06/29/18 1719 14     Temp 06/29/18 1719 97.8 F (36.6 C)     Temp Source 06/29/18 1719 Oral     SpO2 06/29/18 1719 100 %     Weight 06/29/18 1720 191 lb (86.6 kg)      Height 06/29/18 1720 5\' 7"  (1.702 m)     Head Circumference --      Peak Flow --      Pain Score 06/29/18 1720 4     Pain Loc --      Pain Edu? --      Excl. in GC? --     Vital signs reviewed, nursing assessments reviewed.   Constitutional:   Alert and oriented. Non-toxic appearance. Eyes:   Conjunctivae are normal. EOMI. PERRL. ENT      Head:   Normocephalic with approximately 3 mm cut on the back of the scalp over the occiput.  Hemostatic.  Very superficial.      Nose:   No congestion/rhinnorhea.  No epistaxis      Mouth/Throat:   MMM, no pharyngeal erythema. No peritonsillar mass.  No intraoral injury      Neck:   No meningismus. Full ROM.  No midline spinal tenderness Hematological/Lymphatic/Immunilogical:   No cervical lymphadenopathy. Cardiovascular:   RRR. Symmetric bilateral radial and DP pulses.  No murmurs. Cap refill less than 2 seconds. Respiratory:   Normal respiratory effort without tachypnea/retractions. Breath sounds are clear and equal bilaterally. No wheezes/rales/rhonchi. Gastrointestinal:   Soft and nontender. Non distended. There is no CVA tenderness.  No rebound, rigidity, or guarding. Musculoskeletal:   Normal range of motion in all extremities. No joint effusions.  No lower extremity tenderness.  No edema. Neurologic:   Normal speech and language.  Motor grossly intact. No acute focal neurologic deficits are appreciated.  Skin:    Skin is warm, dry with small cut on the back of the scalp, no other injuries.  No rash.  ____________________________________________    LABS (pertinent positives/negatives) (all labs ordered are listed, but only abnormal results are displayed) Labs Reviewed - No data to display ____________________________________________   EKG Interpreted by me Sinus rhythm rate of 79, normal axis intervals QRS and ST segments.  T wave inversions in inferior and anterior leads, unchanged compared to June 21, 2018, likely chronic due  to her pulmonary embolism.   ____________________________________________    RADIOLOGY  Ct Head Wo Contrast  Result Date: 06/29/2018 CLINICAL DATA:  Pain after fall.  Posterior laceration. EXAM: CT HEAD WITHOUT CONTRAST TECHNIQUE: Contiguous axial images were obtained from the base of the skull through the vertex without intravenous contrast. COMPARISON:  June 21, 2018 FINDINGS: Brain: No subdural, epidural, or subarachnoid hemorrhage. Cerebellum, brainstem, and basal cisterns are normal. Ventricles and sulci are stable and mildly prominent. No mass effect or midline shift. No acute cortical ischemia or infarct identified. Vascular: Calcified atherosclerosis in the intracranial carotids. Skull: Normal. Negative for fracture or focal lesion. Sinuses/Orbits: No acute finding. Other: None. IMPRESSION: No acute intracranial abnormality. Electronically Signed   By: Gerome Sam III M.D   On: 06/29/2018 18:20    ____________________________________________   PROCEDURES Procedures  ____________________________________________  DIFFERENTIAL DIAGNOSIS   Subdural hematoma, recurrent fall with scalp contusion  CLINICAL IMPRESSION / ASSESSMENT AND PLAN / ED COURSE  Pertinent labs & imaging results that were available during my care of the patient were reviewed by me and considered in my medical decision making (see chart for details).    Patient presents after a fall.  She is on Xarelto for saddle pulmonary embolism that happened in the past.  CT scan obtained to evaluate for intracranial hemorrhage given the blood thinner use.  This was fortunately negative for any acute process.  She is stable with unremarkable vital signs and able to follow-up with her doctor outpatient.      ____________________________________________   FINAL CLINICAL IMPRESSION(S) / ED DIAGNOSES    Final diagnoses:  Fall, initial encounter  Contusion of scalp, initial encounter     ED Discharge Orders     None      Portions of this note were generated with dragon dictation software. Dictation errors may occur despite best attempts at proofreading.    Sharman Cheek, MD 06/29/18 2024    Sharman Cheek, MD 06/29/18 2026

## 2018-06-29 NOTE — Discharge Instructions (Signed)
Your CT scan of the head was okay today.  Continue taking your medications and follow-up with your doctor.  We gave you a tetanus shot today.

## 2018-07-15 DIAGNOSIS — R296 Repeated falls: Secondary | ICD-10-CM | POA: Insufficient documentation

## 2018-07-15 DIAGNOSIS — R2689 Other abnormalities of gait and mobility: Secondary | ICD-10-CM | POA: Insufficient documentation

## 2018-07-25 ENCOUNTER — Emergency Department: Payer: Medicare HMO

## 2018-07-25 ENCOUNTER — Other Ambulatory Visit: Payer: Self-pay

## 2018-07-25 ENCOUNTER — Observation Stay
Admission: EM | Admit: 2018-07-25 | Discharge: 2018-07-28 | Disposition: A | Discharge status: Home / Self Care | Payer: Medicare HMO | Attending: Internal Medicine | Admitting: Internal Medicine

## 2018-07-25 DIAGNOSIS — Z7902 Long term (current) use of antithrombotics/antiplatelets: Secondary | ICD-10-CM | POA: Insufficient documentation

## 2018-07-25 DIAGNOSIS — K219 Gastro-esophageal reflux disease without esophagitis: Secondary | ICD-10-CM | POA: Diagnosis not present

## 2018-07-25 DIAGNOSIS — I480 Paroxysmal atrial fibrillation: Secondary | ICD-10-CM | POA: Insufficient documentation

## 2018-07-25 DIAGNOSIS — D62 Acute posthemorrhagic anemia: Principal | ICD-10-CM | POA: Insufficient documentation

## 2018-07-25 DIAGNOSIS — Z90710 Acquired absence of both cervix and uterus: Secondary | ICD-10-CM | POA: Diagnosis not present

## 2018-07-25 DIAGNOSIS — W1812XA Fall from or off toilet with subsequent striking against object, initial encounter: Secondary | ICD-10-CM | POA: Diagnosis not present

## 2018-07-25 DIAGNOSIS — W19XXXA Unspecified fall, initial encounter: Secondary | ICD-10-CM

## 2018-07-25 DIAGNOSIS — K449 Diaphragmatic hernia without obstruction or gangrene: Secondary | ICD-10-CM | POA: Insufficient documentation

## 2018-07-25 DIAGNOSIS — Z7901 Long term (current) use of anticoagulants: Secondary | ICD-10-CM | POA: Insufficient documentation

## 2018-07-25 DIAGNOSIS — N200 Calculus of kidney: Secondary | ICD-10-CM | POA: Diagnosis not present

## 2018-07-25 DIAGNOSIS — Z87891 Personal history of nicotine dependence: Secondary | ICD-10-CM | POA: Diagnosis not present

## 2018-07-25 DIAGNOSIS — Z9660 Presence of unspecified orthopedic joint implant: Secondary | ICD-10-CM | POA: Diagnosis not present

## 2018-07-25 DIAGNOSIS — Z794 Long term (current) use of insulin: Secondary | ICD-10-CM | POA: Insufficient documentation

## 2018-07-25 DIAGNOSIS — Z7401 Bed confinement status: Secondary | ICD-10-CM | POA: Insufficient documentation

## 2018-07-25 DIAGNOSIS — I5032 Chronic diastolic (congestive) heart failure: Secondary | ICD-10-CM | POA: Insufficient documentation

## 2018-07-25 DIAGNOSIS — Y92121 Bathroom in nursing home as the place of occurrence of the external cause: Secondary | ICD-10-CM | POA: Insufficient documentation

## 2018-07-25 DIAGNOSIS — Z79899 Other long term (current) drug therapy: Secondary | ICD-10-CM | POA: Diagnosis not present

## 2018-07-25 DIAGNOSIS — S300XXA Contusion of lower back and pelvis, initial encounter: Secondary | ICD-10-CM | POA: Insufficient documentation

## 2018-07-25 DIAGNOSIS — Z993 Dependence on wheelchair: Secondary | ICD-10-CM | POA: Insufficient documentation

## 2018-07-25 DIAGNOSIS — Z8249 Family history of ischemic heart disease and other diseases of the circulatory system: Secondary | ICD-10-CM | POA: Insufficient documentation

## 2018-07-25 DIAGNOSIS — I110 Hypertensive heart disease with heart failure: Secondary | ICD-10-CM | POA: Insufficient documentation

## 2018-07-25 DIAGNOSIS — Z800 Family history of malignant neoplasm of digestive organs: Secondary | ICD-10-CM | POA: Insufficient documentation

## 2018-07-25 DIAGNOSIS — K5730 Diverticulosis of large intestine without perforation or abscess without bleeding: Secondary | ICD-10-CM | POA: Diagnosis not present

## 2018-07-25 DIAGNOSIS — Z86711 Personal history of pulmonary embolism: Secondary | ICD-10-CM | POA: Insufficient documentation

## 2018-07-25 DIAGNOSIS — T148XXA Other injury of unspecified body region, initial encounter: Secondary | ICD-10-CM | POA: Diagnosis present

## 2018-07-25 DIAGNOSIS — Z888 Allergy status to other drugs, medicaments and biological substances status: Secondary | ICD-10-CM | POA: Insufficient documentation

## 2018-07-25 DIAGNOSIS — Z91010 Allergy to peanuts: Secondary | ICD-10-CM | POA: Diagnosis not present

## 2018-07-25 DIAGNOSIS — Z8041 Family history of malignant neoplasm of ovary: Secondary | ICD-10-CM | POA: Insufficient documentation

## 2018-07-25 DIAGNOSIS — E119 Type 2 diabetes mellitus without complications: Secondary | ICD-10-CM | POA: Diagnosis not present

## 2018-07-25 DIAGNOSIS — Y9389 Activity, other specified: Secondary | ICD-10-CM | POA: Insufficient documentation

## 2018-07-25 LAB — BASIC METABOLIC PANEL
ANION GAP: 7 (ref 5–15)
BUN: 15 mg/dL (ref 8–23)
CO2: 27 mmol/L (ref 22–32)
Calcium: 8.7 mg/dL — ABNORMAL LOW (ref 8.9–10.3)
Chloride: 103 mmol/L (ref 98–111)
Creatinine, Ser: 1.12 mg/dL — ABNORMAL HIGH (ref 0.44–1.00)
GFR calc non Af Amer: 48 mL/min — ABNORMAL LOW (ref >60)
GFR, EST AFRICAN AMERICAN: 55 mL/min — AB (ref >60)
Glucose, Bld: 201 mg/dL — ABNORMAL HIGH (ref 70–99)
POTASSIUM: 4.6 mmol/L (ref 3.5–5.1)
SODIUM: 137 mmol/L (ref 135–145)

## 2018-07-25 LAB — PROTIME-INR
INR: 1.82
PROTHROMBIN TIME: 20.9 s — AB (ref 11.4–15.2)

## 2018-07-25 LAB — CBC
HEMATOCRIT: 27.2 % — AB (ref 35.0–47.0)
HEMOGLOBIN: 8.9 g/dL — AB (ref 12.0–16.0)
MCH: 30.6 pg (ref 26.0–34.0)
MCHC: 32.6 g/dL (ref 32.0–36.0)
MCV: 93.9 fL (ref 80.0–100.0)
Platelets: 269 10*3/uL (ref 150–440)
RBC: 2.9 MIL/uL — AB (ref 3.80–5.20)
RDW: 17.2 % — ABNORMAL HIGH (ref 11.5–14.5)
WBC: 8.3 10*3/uL (ref 3.6–11.0)

## 2018-07-25 LAB — APTT: APTT: 38 s — AB (ref 24–36)

## 2018-07-25 LAB — GLUCOSE, CAPILLARY: GLUCOSE-CAPILLARY: 169 mg/dL — AB (ref 70–99)

## 2018-07-25 LAB — TYPE AND SCREEN
ABO/RH(D): B POS
ANTIBODY SCREEN: NEGATIVE

## 2018-07-25 LAB — MRSA PCR SCREENING: MRSA BY PCR: NEGATIVE

## 2018-07-25 MED ORDER — PANTOPRAZOLE SODIUM 40 MG PO TBEC
40.0000 mg | DELAYED_RELEASE_TABLET | Freq: Every day | ORAL | Status: DC
  Administered 2018-07-26 – 2018-07-28 (×3): 40 mg via ORAL
  Filled 2018-07-25 (×3): qty 1

## 2018-07-25 MED ORDER — CARBIDOPA-LEVODOPA 25-100 MG PO TABS
1.0000 | ORAL_TABLET | Freq: Three times a day (TID) | ORAL | Status: DC
  Administered 2018-07-25 – 2018-07-28 (×8): 1 via ORAL
  Filled 2018-07-25 (×10): qty 1

## 2018-07-25 MED ORDER — ONDANSETRON HCL 4 MG/2ML IJ SOLN
4.0000 mg | Freq: Once | INTRAMUSCULAR | Status: AC
  Administered 2018-07-25: 4 mg via INTRAVENOUS
  Filled 2018-07-25: qty 2

## 2018-07-25 MED ORDER — VALACYCLOVIR HCL 500 MG PO TABS
1000.0000 mg | ORAL_TABLET | Freq: Every day | ORAL | Status: DC
  Administered 2018-07-26 – 2018-07-28 (×3): 1000 mg via ORAL
  Filled 2018-07-25 (×3): qty 2

## 2018-07-25 MED ORDER — IPRATROPIUM BROMIDE 0.03 % NA SOLN
2.0000 | Freq: Two times a day (BID) | NASAL | Status: DC | PRN
  Filled 2018-07-25: qty 30

## 2018-07-25 MED ORDER — FERROUS SULFATE 325 (65 FE) MG PO TABS
325.0000 mg | ORAL_TABLET | Freq: Every day | ORAL | Status: DC
  Administered 2018-07-26 – 2018-07-27 (×2): 325 mg via ORAL
  Filled 2018-07-25 (×2): qty 1

## 2018-07-25 MED ORDER — IOPAMIDOL (ISOVUE-300) INJECTION 61%
100.0000 mL | Freq: Once | INTRAVENOUS | Status: AC | PRN
  Administered 2018-07-25: 100 mL via INTRAVENOUS

## 2018-07-25 MED ORDER — SPIRONOLACTONE 25 MG PO TABS
25.0000 mg | ORAL_TABLET | Freq: Every day | ORAL | Status: DC
  Administered 2018-07-26 – 2018-07-28 (×3): 25 mg via ORAL
  Filled 2018-07-25 (×3): qty 1

## 2018-07-25 MED ORDER — FESOTERODINE FUMARATE ER 8 MG PO TB24
8.0000 mg | ORAL_TABLET | Freq: Every day | ORAL | Status: DC
  Administered 2018-07-26 – 2018-07-28 (×3): 8 mg via ORAL
  Filled 2018-07-25 (×3): qty 1

## 2018-07-25 MED ORDER — HYDROCODONE-ACETAMINOPHEN 5-325 MG PO TABS
1.0000 | ORAL_TABLET | ORAL | Status: DC | PRN
  Administered 2018-07-26 – 2018-07-27 (×2): 2 via ORAL
  Filled 2018-07-25 (×2): qty 2

## 2018-07-25 MED ORDER — ONDANSETRON HCL 4 MG PO TABS: 4.0000 mg | ORAL_TABLET | Freq: Four times a day (QID) | ORAL | Status: DC | PRN

## 2018-07-25 MED ORDER — SODIUM CHLORIDE 0.9% FLUSH: 3.0000 mL | INTRAVENOUS | Status: DC | PRN

## 2018-07-25 MED ORDER — FLUOROMETHOLONE 0.1 % OP SUSP
1.0000 [drp] | Freq: Two times a day (BID) | OPHTHALMIC | Status: DC
  Administered 2018-07-25 – 2018-07-28 (×6): 1 [drp] via OPHTHALMIC
  Filled 2018-07-25: qty 5

## 2018-07-25 MED ORDER — SODIUM CHLORIDE 0.9% FLUSH
3.0000 mL | Freq: Two times a day (BID) | INTRAVENOUS | Status: DC
  Administered 2018-07-25 – 2018-07-28 (×6): 3 mL via INTRAVENOUS

## 2018-07-25 MED ORDER — ONDANSETRON HCL 4 MG/2ML IJ SOLN: 4.0000 mg | Freq: Four times a day (QID) | INTRAMUSCULAR | Status: DC | PRN

## 2018-07-25 MED ORDER — PREDNISOLONE ACETATE 1 % OP SUSP
1.0000 [drp] | Freq: Two times a day (BID) | OPHTHALMIC | Status: DC
  Administered 2018-07-25 – 2018-07-28 (×6): 1 [drp] via OPHTHALMIC
  Filled 2018-07-25: qty 1

## 2018-07-25 MED ORDER — SERTRALINE HCL 50 MG PO TABS
100.0000 mg | ORAL_TABLET | Freq: Every day | ORAL | Status: DC
  Administered 2018-07-26 – 2018-07-28 (×3): 100 mg via ORAL
  Filled 2018-07-25 (×3): qty 2

## 2018-07-25 MED ORDER — FENTANYL CITRATE (PF) 100 MCG/2ML IJ SOLN
25.0000 ug | Freq: Once | INTRAMUSCULAR | Status: AC
  Administered 2018-07-25: 25 ug via INTRAVENOUS
  Filled 2018-07-25: qty 2

## 2018-07-25 MED ORDER — SOTALOL HCL 80 MG PO TABS
80.0000 mg | ORAL_TABLET | Freq: Two times a day (BID) | ORAL | Status: DC
  Administered 2018-07-25 – 2018-07-28 (×6): 80 mg via ORAL
  Filled 2018-07-25 (×7): qty 1

## 2018-07-25 MED ORDER — ACETAMINOPHEN 325 MG PO TABS: 650.0000 mg | ORAL_TABLET | Freq: Four times a day (QID) | ORAL | Status: DC | PRN

## 2018-07-25 MED ORDER — FLUTICASONE PROPIONATE 50 MCG/ACT NA SUSP
2.0000 | Freq: Every day | NASAL | Status: DC
  Administered 2018-07-26 – 2018-07-28 (×3): 2 via NASAL
  Filled 2018-07-25: qty 16

## 2018-07-25 MED ORDER — HYDRALAZINE HCL 25 MG PO TABS
25.0000 mg | ORAL_TABLET | Freq: Two times a day (BID) | ORAL | Status: DC
  Administered 2018-07-25 – 2018-07-28 (×3): 25 mg via ORAL
  Filled 2018-07-25 (×5): qty 1

## 2018-07-25 MED ORDER — ACETAMINOPHEN 650 MG RE SUPP: 650.0000 mg | Freq: Four times a day (QID) | RECTAL | Status: DC | PRN

## 2018-07-25 MED ORDER — INSULIN ASPART 100 UNIT/ML ~~LOC~~ SOLN
0.0000 [IU] | Freq: Three times a day (TID) | SUBCUTANEOUS | Status: DC
  Administered 2018-07-26 – 2018-07-28 (×7): 2 [IU] via SUBCUTANEOUS
  Filled 2018-07-25 (×7): qty 1

## 2018-07-25 MED ORDER — POTASSIUM CHLORIDE CRYS ER 10 MEQ PO TBCR
10.0000 meq | EXTENDED_RELEASE_TABLET | Freq: Every day | ORAL | Status: DC
  Administered 2018-07-26 – 2018-07-28 (×3): 10 meq via ORAL
  Filled 2018-07-25 (×3): qty 1

## 2018-07-25 MED ORDER — DOCUSATE SODIUM 100 MG PO CAPS
100.0000 mg | ORAL_CAPSULE | Freq: Two times a day (BID) | ORAL | Status: DC
  Administered 2018-07-25 – 2018-07-27 (×3): 100 mg via ORAL
  Filled 2018-07-25 (×6): qty 1

## 2018-07-25 MED ORDER — POLYETHYLENE GLYCOL 3350 17 G PO PACK: 17.0000 g | PACK | Freq: Every day | ORAL | Status: DC | PRN

## 2018-07-25 MED ORDER — FUROSEMIDE 20 MG PO TABS
20.0000 mg | ORAL_TABLET | Freq: Every day | ORAL | Status: DC
  Administered 2018-07-26 – 2018-07-28 (×3): 20 mg via ORAL
  Filled 2018-07-25 (×3): qty 1

## 2018-07-25 MED ORDER — INSULIN ASPART 100 UNIT/ML ~~LOC~~ SOLN: 0.0000 [IU] | Freq: Every day | SUBCUTANEOUS | Status: DC

## 2018-07-25 MED ORDER — ROSUVASTATIN CALCIUM 10 MG PO TABS
20.0000 mg | ORAL_TABLET | Freq: Every day | ORAL | Status: DC
  Administered 2018-07-26 – 2018-07-28 (×3): 20 mg via ORAL
  Filled 2018-07-25 (×3): qty 2

## 2018-07-25 MED ORDER — LORATADINE 10 MG PO TABS
10.0000 mg | ORAL_TABLET | Freq: Every day | ORAL | Status: DC
  Administered 2018-07-26 – 2018-07-28 (×3): 10 mg via ORAL
  Filled 2018-07-25 (×3): qty 1

## 2018-07-25 MED ORDER — CALCIUM CARBONATE-VITAMIN D 500-200 MG-UNIT PO TABS
1.0000 | ORAL_TABLET | Freq: Two times a day (BID) | ORAL | Status: DC
  Administered 2018-07-25 – 2018-07-28 (×6): 1 via ORAL
  Filled 2018-07-25 (×6): qty 1

## 2018-07-25 MED ORDER — SODIUM CHLORIDE 0.9 % IV SOLN: 250.0000 mL | INTRAVENOUS | Status: DC | PRN

## 2018-07-25 NOTE — Progress Notes (Signed)
Family Meeting Note  Advance Directive:yes  Today a meeting took place with the Patient.  Patient is able to participate  The following clinical team members were present during this meeting:MD  The following were discussed:Patient's diagnosis: Diabetes, frequent falls, A. fib, hypertension, hematoma, anemia, Patient's progosis: Unable to determine and Goals for treatment: Full Code  Additional follow-up to be provided: prn  Time spent during discussion:20 minutes  Bertrum SolMontell D Salary, MD

## 2018-07-25 NOTE — ED Provider Notes (Addendum)
[facility] Emergency Department Provider Note  ____________________________________________  Time seen: Approximately 4:10 PM  I have reviewed the triage vital signs and the nursing notes.   HISTORY  Chief Complaint Bleeding/Bruising   HPI Virginia Barnes is a 72 y.o. female with a history as listed below on Plavix who presents from [place] house for evaluation of lower back hematoma.  Patient had a fall 5 days ago.  She was trying to get up from the commode when she lost her balance and fell backward.  She hit her back onto the toilet.  She has had pain which has been constant, sharp, 6 out of 10 at rest and 10 out of 10 with movement or walking.  The pain is located diffusely across her lower back.  She has had progressively worsening bruising and swelling which prompted her visit to the emergency room today.  She denies head trauma or LOC.  She denies neck pain or any extremity pain.  She reports that the fall was mechanical in nature.  Past Medical History:  Diagnosis Date  . Arrhythmia    atrial fibrillation  . CHF (congestive heart failure) (HCC)   . Diabetes mellitus without complication (HCC)   . Diverticulitis   . Frequent falls   . Hypertension     Patient Active Problem List   Diagnosis Date Noted  . 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 Chronic diastolic heart failure (HCC) 02/17/2018  . HTN (hypertension) 02/17/2018  . PAF (paroxysmal atrial fibrillation) (HCC) 02/17/2018  . Lymphedema 02/17/2018  . Diabetes (HCC) 02/17/2018  . Sepsis (HCC) 01/30/2018  . Hypoglycemia 01/12/2018  . Knee pain 05/17/2017    Past Surgical History:  Procedure Laterality Date  . ABDOMINAL HYSTERECTOMY    . CARDIAC CATHETERIZATION Left 04/13/2016   Procedure: Left Heart Cath and Coronary Angiography;  Surgeon: Laurier Nancy, MD;  Location: [facility] INVASIVE CV LAB;  Service: Cardiovascular;  Laterality: Left;  . JOINT REPLACEMENT      Prior to  Admission medications   Medication Sig Start Date End Date Taking? Authorizing Provider  Calcium Carbonate-Vitamin D3 (CALCIUM 600/VITAMIN D) 600-400 MG-UNIT TABS Take 1 tablet by mouth 2 (two) times daily.   Yes [provider]  carbidopa-levodopa (SINEMET IR) 25-100 MG tablet Take 1 tablet by mouth 3 (three) times daily.   Yes [provider]  cetirizine (ZYRTEC) 10 MG tablet Take 10 mg by mouth daily.    Yes [provider]  ferrous sulfate 325 (65 FE) MG EC tablet Take 325 mg by mouth daily.   Yes [provider]  fesoterodine (TOVIAZ) 8 MG TB24 tablet Take 8 mg by mouth daily.   Yes [provider]  fluorometholone (FML) 0.1 % ophthalmic suspension Place 1 drop into the right eye 2 (two) times daily.   Yes [provider]  fluticasone (FLONASE) 50 MCG/ACT nasal spray Place 2 sprays into both nostrils daily.   Yes [provider]  furosemide (LASIX) 40 MG tablet Take 1 tablet (40 mg total) by mouth daily. Patient taking differently: Take 20 mg by mouth daily.  02/06/18 02/06/19 Yes Salary, Evelena Asa, MD  hydrALAZINE (APRESOLINE) 25 MG tablet Take 25 mg by mouth 2 (two) times daily.   Yes [provider]  ipratropium (ATROVENT) 0.03 % nasal spray Place 2 sprays into both nostrils 3 (three) times daily as needed for rhinitis.   Yes [provider]  NOVOLOG FLEXPEN 100 UNIT/ML FlexPen Inject 0-10 Units into  the skin 3 (three) times daily with meals. <60 Call MD 151-200 - 2 units 201-250 4 units 251-300 - 6 units 301-350 - 8 units 351-400 - 10 units >400 Call MD 05/23/18  Yes [provider]  omeprazole (PRILOSEC) 40 MG capsule Take 1 capsule by mouth daily. 09/26/15  Yes [provider]  potassium chloride (K-DUR,KLOR-CON) 10 MEQ tablet Take 10 mEq by mouth daily. 05/09/18  Yes [provider]  prednisoLONE acetate (PRED FORTE) 1 % ophthalmic suspension Place 1 drop into both eyes 2 (two) times  daily.    Yes [provider]  rivaroxaban (XARELTO) 20 MG TABS tablet Take 1 tablet (20 mg total) by mouth daily with supper. Patient taking differently: Take 20 mg by mouth daily.  06/24/18 08/23/18 Yes Sainani, Rolly PancakeVivek J, MD  rosuvastatin (CRESTOR) 20 MG tablet Take 20 mg by mouth daily.   Yes [provider]  sertraline (ZOLOFT) 100 MG tablet Take 100 mg by mouth daily.   Yes [provider]  sotalol (BETAPACE) 80 MG tablet Take 80 mg by mouth 2 (two) times daily.   Yes [provider]  spironolactone (ALDACTONE) 25 MG tablet Take 25 mg by mouth daily.    Yes [provider]  valACYclovir (VALTREX) 1000 MG tablet Take 1,000 mg by mouth daily.   Yes [provider]  Rivaroxaban (XARELTO) 15 MG TABS tablet Take 1 tablet (15 mg total) by mouth 2 (two) times daily for 19 days. Patient not taking: Reported on 07/25/2018 06/04/18 06/23/18  [place] SirenSainani, Vivek J, MD    Allergies Peanuts [peanut oil]; Lasix [furosemide]; and Lisinopril  Family History  Problem Relation Age of Onset  . Stomach cancer Sister 9425  . Vaginal cancer Sister 3730  . Heart disease Father   . Breast cancer Neg Hx     Social History Social History   Tobacco Use  . Smoking status: Former Smoker    Packs/day: 0.50    Years: 15.00    Pack years: 7.50    Types: Cigarettes  . Smokeless tobacco: Never Used  Substance Use Topics  . Alcohol use: No  . Drug use: No    Review of Systems Constitutional: Negative for fever. Eyes: Negative for visual changes. ENT: Negative for facial injury or neck injury Cardiovascular: Negative for chest injury. Respiratory: Negative for shortness of breath. Negative for chest wall injury. Gastrointestinal: Negative for abdominal pain or injury. Genitourinary: Negative for dysuria. Musculoskeletal: +back injury, negative for arm or leg pain. Skin: Negative for laceration/abrasions. Neurological: Negative for head  injury.  ____________________________________________   PHYSICAL EXAM:  VITAL SIGNS: ED Triage Vitals  Enc Vitals Group     BP 07/25/18 1356 (!) 127/45     Pulse Rate 07/25/18 1356 88     Resp 07/25/18 1356 14     Temp 07/25/18 1356 98.4 F (36.9 C)     Temp Source 07/25/18 1356 Oral     SpO2 07/25/18 1356 100 %     Weight 07/25/18 1357 189 lb 9.5 oz (86 kg)     Height 07/25/18 1357 5\' 7"  (1.702 m)     Head Circumference --      Peak Flow --      Pain Score 07/25/18 1357 8     Pain Loc --      Pain Edu? --      Excl. in GC? --     Constitutional: Alert and oriented. No acute distress. Does not appear intoxicated. HEENT Head:  Normocephalic and atraumatic. Face: No facial bony tenderness. Stable midface Ears: No hemotympanum bilaterally. No Battle sign Eyes: No eye injury. PERRL. No raccoon eyes Nose: Nontender. No epistaxis. No rhinorrhea Mouth/Throat: Mucous membranes are moist. No oropharyngeal blood. No dental injury. Airway patent without stridor. Normal voice. Neck: C-collar in place. No midline c-spine tenderness.  Cardiovascular: Normal rate, regular rhythm. Normal and symmetric distal pulses are present in all extremities. Pulmonary/Chest: Chest wall is stable and nontender to palpation/compression. Normal respiratory effort. Breath sounds are normal. No crepitus.  Abdominal: Soft, nontender, non distended. Musculoskeletal: Large hematoma located in the lower back mostly on the L side. Nontender with normal full range of motion in all extremities. No deformities. No thoracic or lumbar midline spinal tenderness. Pelvis is stable. Skin: Skin is warm, dry and intact. No abrasions or contutions. Psychiatric: Speech and behavior are appropriate. Neurological: Normal speech and language. Moves all extremities to command. No gross focal neurologic deficits are appreciated.  Glascow Coma Score: 4 - Opens eyes on own 6 - Follows simple motor commands 5 - Alert and  oriented GCS: 15   ____________________________________________   LABS (all labs ordered are listed, but only abnormal results are displayed)  Labs Reviewed  CBC - Abnormal; Notable for the following components:      Result Value   RBC 2.90 (*)    Hemoglobin 8.9 (*)    HCT 27.2 (*)    RDW 17.2 (*)    All other components within normal limits  BASIC METABOLIC PANEL - Abnormal; Notable for the following components:   Glucose, Bld 201 (*)    Creatinine, Ser 1.12 (*)    Calcium 8.7 (*)    GFR calc non Af Amer 48 (*)    GFR calc Af Amer 55 (*)    All other components within normal limits  PROTIME-INR - Abnormal; Notable for the following components:   Prothrombin Time 20.9 (*)    All other components within normal limits  APTT - Abnormal; Notable for the following components:   aPTT 38 (*)    All other components within normal limits  TYPE AND SCREEN   ____________________________________________  EKG  ED ECG REPORT I, Nita Sickle, the attending physician, personally viewed and interpreted this ECG.  Normal sinus rhythm, rate of 88, normal intervals, normal axis, no ST elevations or depressions. ____________________________________________  RADIOLOGY  CT: PND ____________________________________________   PROCEDURES  Procedure(s) performed: None Procedures Critical Care performed:  None ____________________________________________   INITIAL IMPRESSION / ASSESSMENT AND PLAN / ED COURSE   72 y.o. female with a history as listed below on Plavix who presents from [facility] for evaluation of lower back hematoma.  Patient with a fall 5 days ago, has a large lower back hematoma with significant tenderness to palpation.  The area feels warm to the touch.  She is hemodynamically stable but she is on Plavix.  Her labs show a hemoglobin drop from 12.8-8.9 since last month.  CT is pending to rule out active bleeding.      _________________________ 4:15 PM on  07/25/2018 ----------------------------------------- CT shows a large hematoma with no evidence of active bleeding.  Due to significant drop in hemoglobin patient will be admitted for observation and pain control    As part of my medical decision making, I reviewed the following data within the electronic MEDICAL RECORD NUMBER Nursing notes reviewed and incorporated, Labs reviewed , EKG interpreted , Old chart reviewed, Notes from prior ED visits and [place] Controlled Substance  Database    Pertinent labs & imaging results that were available during my care of the patient were reviewed by me and considered in my medical decision making (see chart for details).    ____________________________________________   FINAL CLINICAL IMPRESSION(S) / ED DIAGNOSES  Final diagnoses:  Fall, initial encounter  Traumatic hematoma of lower back, initial encounter      NEW MEDICATIONS STARTED DURING THIS VISIT:  ED Discharge Orders    None       Note:  This document was prepared using Dragon voice recognition software and may include unintentional dictation errors.    Don Perking, [place], MD 07/25/18 1614    Nita Sickle, MD 07/25/18 [phone]

## 2018-07-25 NOTE — ED Notes (Addendum)
Placed pure wick external female catheter on pt.

## 2018-07-25 NOTE — ED Triage Notes (Addendum)
To ER via [facility] from [facility] for bruising to lower back. Pt fell last week and was not seen. Pt has large amount of bruising to lower back, hot to touch, tender. Pt alert and oriented X4, active, cooperative, pt in NAD. RR even and unlabored, color WNL.  VSS with EMS, CBG 113.

## 2018-07-25 NOTE — H&P (Signed)
[facility] [facility] [place] [facility]   PATIENT NAME: Virginia Barnes    MR#:  [id number]  DATE OF BIRTH:  1946/01/24  DATE OF ADMISSION:  07/25/2018  PRIMARY CARE PHYSICIAN: Margaretann LovelessKhan, Neelam S, MD   REQUESTING/REFERRING PHYSICIAN:   CHIEF COMPLAINT:   Chief Complaint  Patient presents with  . Bleeding/Bruising    HISTORY OF PRESENT ILLNESS: Virginia Barnes  is a 72 y.o. female with a known history per below which also includes A. fib on Xarelto, bedbound/wheelchair dependent, status post fall 5 days ago at the nursing home, comes into the emergency room with bruising/back pain, ER work-up noted for hemoglobin 8.9 down from 12.8, INR 1.8, CT abdomen noted for 13 x 14 x 4 cm back hematoma, patient evaluated at the bedside, no apparent distress, pain 8 out of 10 with movement, patient now be admitted for acute back hematoma status post fall with associated pain.  PAST MEDICAL HISTORY:   Past Medical History:  Diagnosis Date  . Arrhythmia    atrial fibrillation  . CHF (congestive heart failure) (HCC)   . Diabetes mellitus without complication (HCC)   . Diverticulitis   . Frequent falls   . Hypertension     PAST SURGICAL HISTORY:  Past Surgical History:  Procedure Laterality Date  . ABDOMINAL HYSTERECTOMY    . CARDIAC CATHETERIZATION Left 04/13/2016   Procedure: Left Heart Cath and Coronary Angiography;  Surgeon: Laurier NancyShaukat A Khan, MD;  Location: [facility] INVASIVE CV LAB;  Service: Cardiovascular;  Laterality: Left;  . JOINT REPLACEMENT      SOCIAL HISTORY:  Social History   Tobacco Use  . Smoking status: Former Smoker    Packs/day: 0.50    Years: 15.00    Pack years: 7.50    Types: Cigarettes  . Smokeless tobacco: Never Used  Substance Use Topics  . Alcohol use: No    FAMILY HISTORY:  Family History  Problem Relation Age of Onset  . Stomach cancer Sister 5425  . Vaginal cancer Sister 5730  . Heart disease Father   . Breast cancer Neg Hx     DRUG ALLERGIES:   Allergies  Allergen Reactions  . Peanuts [Peanut Oil] Shortness Of Breath  . Lasix [Furosemide] Swelling    Tongue swelling  . Lisinopril Swelling    Tongue swelling    REVIEW OF SYSTEMS:   CONSTITUTIONAL: No fever, fatigue or weakness.  EYES: No blurred or double vision.  EARS, NOSE, AND THROAT: No tinnitus or ear pain.  RESPIRATORY: No cough, shortness of breath, wheezing or hemoptysis.  CARDIOVASCULAR: No chest pain, orthopnea, edema.  GASTROINTESTINAL: No nausea, vomiting, diarrhea or abdominal pain.  GENITOURINARY: No dysuria, hematuria.  ENDOCRINE: No polyuria, nocturia,  HEMATOLOGY: No anemia, easy bruising or bleeding SKIN: No rash or lesion. MUSCULOSKELETAL: Back pain    NEUROLOGIC: No tingling, numbness, weakness.  PSYCHIATRY: No anxiety or depression.   MEDICATIONS AT HOME:  Prior to Admission medications   Medication Sig Start Date End Date Taking? Authorizing Provider  Calcium Carbonate-Vitamin D3 (CALCIUM 600/VITAMIN D) 600-400 MG-UNIT TABS Take 1 tablet by mouth 2 (two) times daily.   Yes [provider]  carbidopa-levodopa (SINEMET IR) 25-100 MG tablet Take 1 tablet by mouth 3 (three) times daily.   Yes [provider]  cetirizine (ZYRTEC) 10 MG tablet Take 10 mg by mouth daily.    Yes [provider]  ferrous sulfate 325 (65 FE) MG EC tablet Take 325 mg by mouth daily.   Yes  [provider]  fesoterodine (TOVIAZ) 8 MG TB24 tablet Take 8 mg by mouth daily.   Yes [provider]  fluorometholone (FML) 0.1 % ophthalmic suspension Place 1 drop into the right eye 2 (two) times daily.   Yes [provider]  fluticasone (FLONASE) 50 MCG/ACT nasal spray Place 2 sprays into both nostrils daily.   Yes [provider]  furosemide (LASIX) 40 MG tablet Take 1 tablet (40 mg total) by mouth daily. Patient taking differently: Take 20 mg by mouth daily.  02/06/18 02/06/19 Yes Jacayla Nordell, Evelena Asa, MD  hydrALAZINE  (APRESOLINE) 25 MG tablet Take 25 mg by mouth 2 (two) times daily.   Yes [provider]  ipratropium (ATROVENT) 0.03 % nasal spray Place 2 sprays into both nostrils 3 (three) times daily as needed for rhinitis.   Yes [provider]  NOVOLOG FLEXPEN 100 UNIT/ML FlexPen Inject 0-10 Units into the skin 3 (three) times daily with meals. <60 Call MD 151-200 - 2 units 201-250 4 units 251-300 - 6 units 301-350 - 8 units 351-400 - 10 units >400 Call MD 05/23/18  Yes [provider]  omeprazole (PRILOSEC) 40 MG capsule Take 1 capsule by mouth daily. 09/26/15  Yes [provider]  potassium chloride (K-DUR,KLOR-CON) 10 MEQ tablet Take 10 mEq by mouth daily. 05/09/18  Yes [provider]  prednisoLONE acetate (PRED FORTE) 1 % ophthalmic suspension Place 1 drop into both eyes 2 (two) times daily.    Yes [provider]  rivaroxaban (XARELTO) 20 MG TABS tablet Take 1 tablet (20 mg total) by mouth daily with supper. Patient taking differently: Take 20 mg by mouth daily.  06/24/18 08/23/18 Yes Sainani, Rolly Pancake, MD  rosuvastatin (CRESTOR) 20 MG tablet Take 20 mg by mouth daily.   Yes [provider]  sertraline (ZOLOFT) 100 MG tablet Take 100 mg by mouth daily.   Yes [provider]  sotalol (BETAPACE) 80 MG tablet Take 80 mg by mouth 2 (two) times daily.   Yes [provider]  spironolactone (ALDACTONE) 25 MG tablet Take 25 mg by mouth daily.    Yes [provider]  valACYclovir (VALTREX) 1000 MG tablet Take 1,000 mg by mouth daily.   Yes [provider]  Rivaroxaban (XARELTO) 15 MG TABS tablet Take 1 tablet (15 mg total) by mouth 2 (two) times daily for 19 days. Patient not taking: Reported on 07/25/2018 06/04/18 06/23/18  Houston Siren, MD      PHYSICAL EXAMINATION:   VITAL SIGNS: Blood pressure (!) 127/45, pulse 88, temperature 98.4 F (36.9 C), temperature source Oral, resp. rate 14, height 5\' 7"  (1.702 m),  weight 86 kg, SpO2 100 %.  GENERAL:  72 y.o.-year-old patient lying in the bed with no acute distress.  Frail-appearing EYES: Pupils equal, round, reactive to light and accommodation. No scleral icterus. Extraocular muscles intact.  HEENT: Head atraumatic, normocephalic. Oropharynx and nasopharynx clear.  NECK:  Supple, no jugular venous distention. No thyroid enlargement, no tenderness.  LUNGS: Normal breath sounds bilaterally, no wheezing, rales,rhonchi or crepitation. No use of accessory muscles of respiration.  CARDIOVASCULAR: S1, S2 normal. No murmurs, rubs, or gallops.  ABDOMEN: Soft, nontender, nondistended. Bowel sounds present. No organomegaly or mass.  EXTREMITIES: No pedal edema, cyanosis, or clubbing.  NEUROLOGIC: Cranial nerves II through XII are intact. MAES. Gait not checked.  PSYCHIATRIC: The patient is alert and oriented x 3.  SKIN: No obvious rash, lesion, or ulcer.   LABORATORY PANEL:  CBC Recent Labs  Lab 07/25/18 1423  WBC 8.3  HGB 8.9*  HCT 27.2*  PLT 269  MCV 93.9  MCH 30.6  MCHC 32.6  RDW 17.2*   ------------------------------------------------------------------------------------------------------------------  Chemistries  Recent Labs  Lab 07/25/18 1423  NA 137  K 4.6  CL 103  CO2 27  GLUCOSE 201*  BUN 15  CREATININE 1.12*  CALCIUM 8.7*   ------------------------------------------------------------------------------------------------------------------ estimated creatinine clearance is 51.2 mL/min (A) (by C-G formula based on SCr of 1.12 mg/dL (H)). ------------------------------------------------------------------------------------------------------------------ No results for input(s): TSH, T4TOTAL, T3FREE, THYROIDAB in the last 72 hours.  Invalid input(s): FREET3   Coagulation profile Recent Labs  Lab 07/25/18 1423  INR 1.82    ------------------------------------------------------------------------------------------------------------------- No results for input(s): DDIMER in the last 72 hours. -------------------------------------------------------------------------------------------------------------------  Cardiac Enzymes No results for input(s): CKMB, TROPONINI, MYOGLOBIN in the last 168 hours.  Invalid input(s): CK ------------------------------------------------------------------------------------------------------------------ Invalid input(s): POCBNP  ---------------------------------------------------------------------------------------------------------------  Urinalysis    Component Value Date/Time   COLORURINE YELLOW (A) 05/31/2018 2129   APPEARANCEUR CLEAR (A) 05/31/2018 2129   APPEARANCEUR Hazy 05/11/2013 0039   LABSPEC 1.018 05/31/2018 2129   LABSPEC 1.017 05/11/2013 0039   PHURINE 7.0 05/31/2018 2129   GLUCOSEU NEGATIVE 05/31/2018 2129   GLUCOSEU Negative 05/11/2013 0039   HGBUR NEGATIVE 05/31/2018 2129   BILIRUBINUR NEGATIVE 05/31/2018 2129   BILIRUBINUR Negative 05/11/2013 0039   KETONESUR NEGATIVE 05/31/2018 2129   PROTEINUR NEGATIVE 05/31/2018 2129   NITRITE NEGATIVE 05/31/2018 2129   LEUKOCYTESUR NEGATIVE 05/31/2018 2129   LEUKOCYTESUR Trace 05/11/2013 0039     RADIOLOGY: Ct Abdomen Pelvis W Contrast  Result Date: 07/25/2018 CLINICAL DATA:  Abdominal pelvis trauma, moderate, blunt. Bruising to the low back. Marrow edema. EXAM: CT ABDOMEN AND PELVIS WITH CONTRAST TECHNIQUE: Multidetector CT imaging of the abdomen and pelvis was performed using the standard protocol following bolus administration of intravenous contrast. CONTRAST:  ISOVUE-300 IOPAMIDOL (ISOVUE-300) INJECTION 61% COMPARISON:  Lumbar spine radiographs 05/13/2018. CT of the abdomen and pelvis 11/21/2015. FINDINGS: Lower chest: Mild dependent atelectasis is present bilaterally. The heart size is normal. No  significant pleural or pericardial effusion is present. Hepatobiliary: No focal liver abnormality is seen. No gallstones, gallbladder wall thickening, or biliary dilatation. Pancreas: Unremarkable. No pancreatic ductal dilatation or surrounding inflammatory changes. Spleen: Normal in size without focal abnormality. Adrenals/Urinary Tract: Adrenal glands are normal bilaterally. Exophytic cyst posteriorly in the right kidney demonstrates interval growth. Adjacent nonobstructing punctate stone is present in the right kidney. Left kidney is unremarkable. No obstructive uropathy is present. Ureters are within normal limits bilaterally. The urinary bladder is normal. Stomach/Bowel: A moderate-sized hiatal hernia is again seen. Stomach and duodenum are otherwise normal. The small bowel is within normal limits. Terminal ileum is within normal limits. The appendix is visualized and normal. The ascending and transverse colon are within normal limits. Diverticular changes are present in the descending and sigmoid colon without focal inflammation to suggest diverticulitis. Moderate stool is present throughout the colon. Vascular/Lymphatic: Atherosclerotic calcifications are present within the aorta without aneurysm. Reproductive: Status post hysterectomy. No adnexal masses. Other: The a paraumbilical hernia contains fat without bowel. No free fluid is present. A mixed density the subcutaneous collection over the back is asymmetric to the left, extending to the medial aspect of the sacral nerve stimulator generator. The collection measures 13.9 x 14.6 x 4.0 cm. No definite drainable portion is present. Musculoskeletal: A previously demonstrated superior endplate fracture at L5 demonstrates progression without evidence for acute fracture. Vertebral body heights are  otherwise maintained. Acute or healing fractures are present in the lumbar spine or pelvis. The hips are located and within normal limits bilaterally. IMPRESSION: 1.  Mixed density subcutaneous collection in the back measures 13.9 x 14.6 x 4.0 cm. This is most consistent with a large hematoma. No definite drainable portion is present. There is no peripherally enhancing collection to suggest abscess. 2. No acute fracture. 3. Superior endplate fracture at L5 demonstrates interval progression since 2016 without definite acute component. 4. Nonobstructing punctate stone in the right kidney adjacent to an enlarging posterior renal cyst. 5. Moderate-sized hiatal hernia. 6. Colonic diverticulosis without diverticulitis. Electronically Signed   By: Marin Roberts M.D.   On: 07/25/2018 16:12    EKG: Orders placed or performed during the hospital encounter of 06/29/18  . ED EKG  . ED EKG    IMPRESSION AND PLAN: *Acute large back hematoma status post fall  *Acute on chronic falls -bedbound/wheelchair dependent *Chronic atrial fibrillation *Chronic diabetes mellitus type 2 *Hypertension  Refer to the observation unit, fall/aspiration precautions, adult pain protocol, will discontinue Xarelto/anticoagulants indefinitely given history of chronic falls/large hematoma currently present, avoid antiplatelet/NSAID medications, and consider discharge back to nursing facility on tomorrow if hemoglobin is stable and pain is well controlled   All the records are reviewed and case discussed with ED provider. Management plans discussed with the patient, family and they are in agreement.  CODE STATUS:full Code Status History    Date Active Date Inactive Code Status Order ID Comments User Context   06/01/2018 0126 06/04/2018 2143 Full Code [id number]  Oralia Manis, MD ED   01/30/2018 1826 02/06/2018 2049 Full Code [id number]  Ihor Austin, MD ED   01/12/2018 1118 01/13/2018 1939 Full Code [id number]  Houston Siren, MD Inpatient       TOTAL TIME TAKING CARE OF THIS PATIENT: 45 minutes.    Evelena Asa Carma Dwiggins M.D on 07/25/2018   Between 7am to 6pm - Pager -  [phone]  After 6pm go to [email or web address] - password [email or web address]  [facility]  Office  [phone]  CC: Primary care physician; Margaretann Loveless, MD   Note: This dictation was prepared with Dragon dictation along with smaller phrase technology. Any transcriptional errors that result from this process are unintentional.

## 2018-07-26 DIAGNOSIS — D62 Acute posthemorrhagic anemia: Secondary | ICD-10-CM | POA: Diagnosis not present

## 2018-07-26 LAB — CBC
HCT: 26.1 % — ABNORMAL LOW (ref 35.0–47.0)
HEMOGLOBIN: 8.3 g/dL — AB (ref 12.0–16.0)
MCH: 30.2 pg (ref 26.0–34.0)
MCHC: 32 g/dL (ref 32.0–36.0)
MCV: 94.5 fL (ref 80.0–100.0)
PLATELETS: 269 10*3/uL (ref 150–440)
RBC: 2.76 MIL/uL — AB (ref 3.80–5.20)
RDW: 17.3 % — ABNORMAL HIGH (ref 11.5–14.5)
WBC: 7.9 10*3/uL (ref 3.6–11.0)

## 2018-07-26 LAB — GLUCOSE, CAPILLARY
GLUCOSE-CAPILLARY: 114 mg/dL — AB (ref 70–99)
Glucose-Capillary: 131 mg/dL — ABNORMAL HIGH (ref 70–99)
Glucose-Capillary: 124 mg/dL — ABNORMAL HIGH (ref 70–99)
Glucose-Capillary: 121 mg/dL — ABNORMAL HIGH (ref 70–99)

## 2018-07-26 NOTE — Evaluation (Signed)
Physical Therapy Evaluation Patient Details Name: Virginia Barnes MRN: [id number] DOB: July 03, 1946 Today's Date: 07/26/2018   History of Present Illness  Patient is a 72 year old female admitted for a traumatic hematoma to lower back and acute blood loss anemia following a fall during a toilet transfer at her ALF.  PMH includes Htn, frequent falls, diverticulitis, DM, CHF and arrhythmia.  Clinical Impression  Patient is a 72 year old female who lives in an ALF and [facility]  She has an extensive fall hx.  No family was present during evaluation and pt was very lethargic.  When asked if she ever walks she stated, "it depends in whether or not my back is hurting".  Pt required mod-total A for bed mobility and to scoot up in bed.  She demonstrated poor sitting posture at EOB and required several VC's for body mechanics and safety.  Pt was willing to attempt a transfer for chair with +2 assist but was unable to initiate STS from bed.  She reported feeling dizzy, though not severely hypotensive.  PT terminated transfer for safety purposes.  Pt presented with overall weakness but was unable to participate in formal MMT of LE's.  She reported LBP throughout presented with kyphotic posture as well.  Pt will continue to benefit from skilled PT with focus on regaining strength, safe transfers, balance and fall prevention and bed mobility as well as addressing back pain which pt reports being a limiting factor in ambulation.    Follow Up Recommendations SNF    Equipment Recommendations  None recommended by PT    Recommendations for Other Services       Precautions / Restrictions Precautions Precautions: Fall Precaution Comments: High fall risk Restrictions Weight Bearing Restrictions: No      Mobility  Bed Mobility Overal bed mobility: Needs Assistance Bed Mobility: Supine to Sit;Sit to Supine     Supine to sit: Min assist Sit to supine: Min assist    General bed mobility comments: Min A to bring LE's over EOB.  +2 total A to scoot up in bed.  Provided several VC's for best body mechanics to get to EOB.  Transfers Overall transfer level: Needs assistance   Transfers: Sit to/from Stand Sit to Stand: Total assist;+2 physical assistance         General transfer comment: Pt initiated attempt to stand from bedside and was unable to follow commands for hand placement.  Pt paused several times during transfer attempt and stated that she felt "dizzy".    Ambulation/Gait Ambulation/Gait assistance: (Deferred for safety purposes.)              Stairs            Wheelchair Mobility    Modified Rankin (Stroke Patients Only)       Balance Overall balance assessment: Needs assistance Sitting-balance support: Bilateral upper extremity supported;Feet supported Sitting balance-Leahy Scale: Poor Sitting balance - Comments: Pt continued to lean to the L side after PT cued pt to sit upright.  Pt stated that this is due to back pain. Postural control: Left lateral lean                                   Pertinent Vitals/Pain Pain Assessment: Faces Faces Pain Scale: Hurts a little bit Pain Location: Low back Pain Intervention(s): Monitored during session;Limited activity within patient's tolerance  Home Living Family/patient expects to be discharged to:: Assisted living                      Prior Function Level of Independence: Needs assistance   Gait / Transfers Assistance Needed: Receives assistance with transfers from bed to [facility] and normally performs a squat pivot.  ADL's / Homemaking Assistance Needed: Reported that she receives assistance with bathing and dressing.        Hand Dominance        Extremity/Trunk Assessment   Upper Extremity Assessment Upper Extremity Assessment: Generalized weakness    Lower Extremity Assessment Lower Extremity Assessment: Difficult to assess due to  impaired cognition(Not appropriate to respond to MMT at this time.)    Cervical / Trunk Assessment Cervical / Trunk Assessment: Kyphotic  Communication   Communication: Other (comment)(Patient very lethargic and did not respond to some questions.  States that she feels that some pain medication that she took yesterday is causing her to be lethargic.)  Cognition Arousal/Alertness: Lethargic Behavior During Therapy: Flat affect Overall Cognitive Status: No family/caregiver present to determine baseline cognitive functioning                                 General Comments: Oriented to self and situation but very lethargic and slow to respond to commands.      General Comments      Exercises     Assessment/Plan    PT Assessment Patient needs continued PT services  PT Problem List Decreased strength;Decreased mobility;Decreased balance;Decreased activity tolerance       PT Treatment Interventions DME instruction;Therapeutic activities;Gait training;Therapeutic exercise;Patient/family education;Cognitive remediation;Balance training;Functional mobility training;Neuromuscular re-education    PT Goals (Current goals can be found in the Care Plan section)  Acute Rehab PT Goals PT Goal Formulation: Patient unable to participate in goal setting    Frequency Min 2X/week   Barriers to discharge        Co-evaluation               AM-PAC PT "6 Clicks" Daily Activity  Outcome Measure Difficulty turning over in bed (including adjusting bedclothes, sheets and blankets)?: A Lot Difficulty moving from lying on back to sitting on the side of the bed? : A Lot Difficulty sitting down on and standing up from a chair with arms (e.g., wheelchair, bedside commode, etc,.)?: Unable Help needed moving to and from a bed to chair (including a wheelchair)?: Total Help needed walking in hospital room?: Total Help needed climbing 3-5 steps with a railing? : Total 6 Click Score:  8    End of Session Equipment Utilized During Treatment: Gait belt Activity Tolerance: Patient limited by lethargy;Patient limited by fatigue Patient left: in bed;with bed alarm set;with call bell/phone within reach Nurse Communication: Mobility status PT Visit Diagnosis: Unsteadiness on feet (R26.81);Muscle weakness (generalized) (M62.81)    Time: 1610-9604 PT Time Calculation (min) (ACUTE ONLY): 25 min   Charges:   PT Evaluation $PT Eval Moderate Complexity: 1 Mod PT Treatments $Therapeutic Activity: 8-22 mins        Glenetta Hew, PT, DPT   Glenetta Hew 07/26/2018, 1:03 PM

## 2018-07-26 NOTE — Progress Notes (Signed)
[facility] [place] [facility]   PATIENT NAME: Virginia Barnes    MR#:  [id number]  DATE OF BIRTH:  Jan 19, 1946  SUBJECTIVE:  CHIEF COMPLAINT:   Chief Complaint  Patient presents with  . Bleeding/Bruising   -Patient taking Xarelto, admitted after a fall for retroperitoneal hematoma. -Still complaining of pain in the lower back especially on the left side.  REVIEW OF SYSTEMS:  Review of Systems  Constitutional: Negative for chills, fever and malaise/fatigue.  HENT: Negative for congestion, ear discharge, hearing loss and nosebleeds.   Eyes: Negative for blurred vision and double vision.  Respiratory: Negative for cough, shortness of breath and wheezing.   Cardiovascular: Negative for chest pain and palpitations.  Gastrointestinal: Negative for abdominal pain, constipation, diarrhea, nausea and vomiting.  Genitourinary: Negative for dysuria.  Musculoskeletal: Positive for back pain and myalgias.  Neurological: Positive for weakness. Negative for dizziness, seizures and headaches.    DRUG ALLERGIES:   Allergies  Allergen Reactions  . Peanuts [Peanut Oil] Shortness Of Breath  . Lasix [Furosemide] Swelling    Tongue swelling  . Lisinopril Swelling    Tongue swelling    VITALS:  Blood pressure (!) 104/48, pulse 69, temperature 98.4 F (36.9 C), temperature source Oral, resp. rate 18, height 5\' 9"  (1.753 m), weight 89.8 kg, SpO2 100 %.  PHYSICAL EXAMINATION:  Physical Exam  GENERAL:  72 y.o.-year-old patient lying in the bed with no acute distress.  EYES: Pupils equal, round, reactive to light and accommodation. No scleral icterus. Extraocular muscles intact.  HEENT: Head atraumatic, normocephalic. Oropharynx and nasopharynx clear.  NECK:  Supple, no jugular venous distention. No thyroid enlargement, no tenderness.  LUNGS: Normal breath sounds bilaterally, no wheezing, rales,rhonchi or crepitation. No use of accessory muscles of respiration.  Decreased  bibasilar breath sounds CARDIOVASCULAR: S1, S2 normal. No rubs, or gallops.  2/6 systolic murmur is present ABDOMEN: Soft, nontender, nondistended. Bowel sounds present. No organomegaly or mass.  Bruising noted posteriorly in the lower back region and tender to touch. EXTREMITIES: No pedal edema, cyanosis, or clubbing.  NEUROLOGIC: Cranial nerves II through XII are intact. Muscle strength 5/5 in all extremities. Sensation intact. Gait not checked.  PSYCHIATRIC: The patient is alert and oriented x 3.  SKIN: No obvious rash, lesion, or ulcer.    LABORATORY PANEL:   CBC Recent Labs  Lab 07/26/18 0559  WBC 7.9  HGB 8.3*  HCT 26.1*  PLT 269   ------------------------------------------------------------------------------------------------------------------  Chemistries  Recent Labs  Lab 07/25/18 1423  NA 137  K 4.6  CL 103  CO2 27  GLUCOSE 201*  BUN 15  CREATININE 1.12*  CALCIUM 8.7*   ------------------------------------------------------------------------------------------------------------------  Cardiac Enzymes No results for input(s): TROPONINI in the last 168 hours. ------------------------------------------------------------------------------------------------------------------  RADIOLOGY:  Ct Abdomen Pelvis W Contrast  Result Date: 07/25/2018 CLINICAL DATA:  Abdominal pelvis trauma, moderate, blunt. Bruising to the low back. Marrow edema. EXAM: CT ABDOMEN AND PELVIS WITH CONTRAST TECHNIQUE: Multidetector CT imaging of the abdomen and pelvis was performed using the standard protocol following bolus administration of intravenous contrast. CONTRAST:  100mL ISOVUE-300 IOPAMIDOL (ISOVUE-300) INJECTION 61% COMPARISON:  Lumbar spine radiographs 05/13/2018. CT of the abdomen and pelvis 11/21/2015. FINDINGS: Lower chest: Mild dependent atelectasis is present bilaterally. The heart size is normal. No significant pleural or pericardial effusion is present. Hepatobiliary: No focal  liver abnormality is seen. No gallstones, gallbladder wall thickening, or biliary dilatation. Pancreas: Unremarkable. No pancreatic ductal dilatation or surrounding inflammatory changes. Spleen: Normal  in size without focal abnormality. Adrenals/Urinary Tract: Adrenal glands are normal bilaterally. Exophytic cyst posteriorly in the right kidney demonstrates interval growth. Adjacent nonobstructing punctate stone is present in the right kidney. Left kidney is unremarkable. No obstructive uropathy is present. Ureters are within normal limits bilaterally. The urinary bladder is normal. Stomach/Bowel: A moderate-sized hiatal hernia is again seen. Stomach and duodenum are otherwise normal. The small bowel is within normal limits. Terminal ileum is within normal limits. The appendix is visualized and normal. The ascending and transverse colon are within normal limits. Diverticular changes are present in the descending and sigmoid colon without focal inflammation to suggest diverticulitis. Moderate stool is present throughout the colon. Vascular/Lymphatic: Atherosclerotic calcifications are present within the aorta without aneurysm. Reproductive: Status post hysterectomy. No adnexal masses. Other: The a paraumbilical hernia contains fat without bowel. No free fluid is present. A mixed density the subcutaneous collection over the back is asymmetric to the left, extending to the medial aspect of the sacral nerve stimulator generator. The collection measures 13.9 x 14.6 x 4.0 cm. No definite drainable portion is present. Musculoskeletal: A previously demonstrated superior endplate fracture at L5 demonstrates progression without evidence for acute fracture. Vertebral body heights are otherwise maintained. Acute or healing fractures are present in the lumbar spine or pelvis. The hips are located and within normal limits bilaterally. IMPRESSION: 1. Mixed density subcutaneous collection in the back measures 13.9 x 14.6 x 4.0 cm.  This is most consistent with a large hematoma. No definite drainable portion is present. There is no peripherally enhancing collection to suggest abscess. 2. No acute fracture. 3. Superior endplate fracture at L5 demonstrates interval progression since 2016 without definite acute component. 4. Nonobstructing punctate stone in the right kidney adjacent to an enlarging posterior renal cyst. 5. Moderate-sized hiatal hernia. 6. Colonic diverticulosis without diverticulitis. Electronically Signed   By: Marin Roberts M.D.   On: 07/25/2018 16:12    EKG:   Orders placed or performed during the hospital encounter of 07/25/18  . EKG 12-Lead  . EKG 12-Lead    ASSESSMENT AND PLAN:   72 year old female from [facility] facility with past medical history significant for pulmonary embolism, paroxysmal A. fib on anticoagulation, hypertension, diabetes was brought in secondary to a fall and lower back pain.  1.  Retroperitoneal hematoma-large 14x14x4 cm hematoma in the back secondary to fall while on anticoagulation. -Xarelto has been held. plavix held as well -Continue to monitor hb and transfuse if hb less than 7 or getting symptomatic -Hemoglobin at 8.2 today \- K pad -No drainable abscess noted.  Conservative management at this time  2.  History of pulmonary embolism-in June 2019.  Has been on Xarelto, held due to active retroperitoneal bleed.  Once hemoglobin is stable and no signs of active bleeding.  Will need to start back on her Xarelto -Breathing is stable.  Currently on room air  3.  Paroxysmal atrial fibrillation-heart rate is well controlled. Patient is on Betapace.  Xarelto is on hold  4.  GERD-Protonix  5.-DVT prophylaxis-teds and SCDs  Physical therapy consult prior to discharge once stable    All the records are reviewed and case discussed with Care Management/Social Workerr. Management plans discussed with the patient, family and they are in  agreement.  CODE STATUS: Full code  TOTAL TIME TAKING CARE OF THIS PATIENT: 35 minutes.   POSSIBLE D/C IN 1-2 DAYS, DEPENDING ON CLINICAL CONDITION.   Enid Baas M.D on 07/26/2018 at 9:35 AM  Between  7am to 6pm - Pager - [phone]  After 6pm go to [email or web address] - password [email or web address]  [facility]  Office  [phone]  CC: Primary care physician; Margaretann Loveless, MD

## 2018-07-26 NOTE — Clinical Social Work Note (Signed)
Clinical Social Work Assessment  Patient Details  Name: Virginia Barnes MRN: [id number] Date of Birth: 04-Jan-1946  Date of referral:  07/26/18               Reason for consult:  Facility Placement                Permission sought to share information with:  Chartered certified accountant granted to share information::  Yes, Verbal Permission Granted  Name::        Agency::  [facility]  Relationship::     Contact Information:     Housing/Transportation Living arrangements for the past 2 months:  [place] of Information:  Patient, Medical Team, Adult Children, Facility Patient Interpreter Needed:  None Criminal Activity/Legal Involvement Pertinent to Current Situation/Hospitalization:  No - Comment as needed Significant Relationships:  Warehouse manager, Adult Children Lives with:  Facility Resident Do you feel safe going back to the place where you live?  Yes Need for family participation in patient care:  No (Coment)  Care giving concerns:  Patient admitted from ALF; PT recommendation for SNF   Social Worker assessment / plan: The CSW met with the patient at bedside to discuss discharge planning. The patient was alert and oriented X4. The patient confirmed that she is a resident at [facility] and would like to return. The CSW explained the PT is recommending SNF; the patient declined but did agree to HHPT in the facility.  The patient will discharge most likely tomorrow or Monday. CSW will follow for discharge facilitation.  Employment status:  Retired Nurse, adult PT Recommendations:  [place] / Referral to community resources:     Patient/Family's Response to care:  The patient thanked the CSW  Patient/Family's Understanding of and Emotional Response to Diagnosis, Current Treatment, and Prognosis:  The patient understands the PT recommendation; however, she declines SNF  and would like to return to her ALF with HHPT. The RNCM is aware.  Emotional Assessment Appearance:  Appears stated age Attitude/Demeanor/Rapport:  Engaged Affect (typically observed):  Appropriate, Pleasant Orientation:  Oriented to Self, Oriented to Place, Oriented to  Time, Oriented to Situation Alcohol / Substance use:  Never Used Psych involvement (Current and /or in the community):  No (Comment)  Discharge Needs  Concerns to be addressed:  Care Coordination, Discharge Planning Concerns Readmission within the last 30 days:  No Current discharge risk:  Physical Impairment Barriers to Discharge:  Continued Medical Work up   [employer], LCSW 07/26/2018, 2:25 PM

## 2018-07-26 NOTE — Care Management Obs Status (Signed)
MEDICARE OBSERVATION STATUS NOTIFICATION   Patient Details  Name: Virginia Barnes MRN: [id number] Date of Birth: 07-28-1946   Medicare Observation Status Notification Given:  Yes    Gordie Crumby A Rosibel Giacobbe, RN 07/26/2018, 2:57 PM

## 2018-07-26 NOTE — NC FL2 (Addendum)
[place] MEDICAID FL2 LEVEL OF CARE SCREENING TOOL     IDENTIFICATION  Patient Name: Virginia Barnes Birthdate: October 13, 1946 Sex: female Admission Date (Current Location): 07/25/2018  [place] and [place] Number:  [address] [id number] Q Facility and Address:  [facility], [address], [place], [place] [address]      Provider Number: [id number]  Attending Physician Name and Address:  Enid Baas, MD  Relative Name and Phone Number:  Sophy Mesler [facility]) [phone] or Shantele Reller (DIL) [phone]    Current Level of Care: Hospital Recommended Level of Care: Assisted Living Facility Prior Approval Number:    Date Approved/Denied:   PASRR Number: [id number]  Discharge Plan: Domiciliary (Rest home)    Current Diagnoses: Patient Active Problem List   Diagnosis Date Noted  . Hematoma 07/25/2018  . 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 Chronic diastolic heart failure (HCC) 02/17/2018  . HTN (hypertension) 02/17/2018  . PAF (paroxysmal atrial fibrillation) (HCC) 02/17/2018  . Lymphedema 02/17/2018  . Diabetes (HCC) 02/17/2018  . Sepsis (HCC) 01/30/2018  . Hypoglycemia 01/12/2018  . Knee pain 05/17/2017    Orientation RESPIRATION BLADDER Height & Weight     Self, Time, Situation, Place  Normal Continent Weight: 198 lb (89.8 kg) Height:  5\' 9"  (175.3 cm)  BEHAVIORAL SYMPTOMS/MOOD NEUROLOGICAL BOWEL NUTRITION STATUS      Continent Diet(Heart healthy/Carb modified)  AMBULATORY STATUS COMMUNICATION OF NEEDS Skin   Total Care Verbally Bruising                       Personal Care Assistance Level of Assistance  Bathing, Feeding, Dressing Bathing Assistance: Limited assistance Feeding assistance: Independent Dressing Assistance: Limited assistance     Functional Limitations Info  Sight, Hearing, Speech Sight Info: Adequate Hearing Info: Adequate Speech Info: Adequate    SPECIAL CARE FACTORS FREQUENCY  PT (By licensed PT)      PT Frequency: HHPT               Contractures Contractures Info: Not present    Additional Factors Info  Code Status, Allergies Code Status Info: Full Allergies Info:  Peanuts Peanut Oil, Lasix Furosemide, Lisinopril           DISCHARGE MEDICATIONS:        Allergies as of 07/28/2018      Reactions   Peanuts [peanut Oil] Shortness Of Breath   Lasix [furosemide] Swelling   Tongue swelling   Lisinopril Swelling   Tongue swelling         Medication List    STOP taking these medications   ferrous sulfate 325 (65 FE) MG EC tablet Replaced by:  ferrous sulfate 325 (65 FE) MG tablet     TAKE these medications   CALCIUM 600/VITAMIN D 600-400 MG-UNIT Tabs Generic drug:  Calcium Carbonate-Vitamin D3 Take 1 tablet by mouth 2 (two) times daily.   carbidopa-levodopa 25-100 MG tablet Commonly known as:  SINEMET IR Take 1 tablet by mouth 3 (three) times daily.   cetirizine 10 MG tablet Commonly known as:  ZYRTEC Take 10 mg by mouth daily.   ferrous sulfate 325 (65 FE) MG tablet Take 1 tablet (325 mg total) by mouth 2 (two) times daily with a meal. Replaces:  ferrous sulfate 325 (65 FE) MG EC tablet   fesoterodine 8 MG Tb24 tablet Commonly known as:  TOVIAZ Take 8 mg by mouth daily.   fluorometholone 0.1 % ophthalmic suspension Commonly known as:  FML Place 1 drop  into the right eye 2 (two) times daily.   fluticasone 50 MCG/ACT nasal spray Commonly known as:  FLONASE Place 2 sprays into both nostrils daily.   furosemide 40 MG tablet Commonly known as:  LASIX Take 0.5 tablets (20 mg total) by mouth daily.   hydrALAZINE 25 MG tablet Commonly known as:  APRESOLINE Take 25 mg by mouth 2 (two) times daily.   ipratropium 0.03 % nasal spray Commonly known as:  ATROVENT Place 2 sprays into both nostrils 3 (three) times daily as needed for rhinitis.   NOVOLOG FLEXPEN 100 UNIT/ML FlexPen Generic drug:  insulin aspart Inject 0-10 Units  into the skin 3 (three) times daily with meals. <60 Call MD 151-200 - 2 units 201-250 4 units 251-300 - 6 units 301-350 - 8 units 351-400 - 10 units >400 Call MD   omeprazole 40 MG capsule Commonly known as:  PRILOSEC Take 1 capsule by mouth daily.   potassium chloride 10 MEQ tablet Commonly known as:  K-DUR,KLOR-CON Take 10 mEq by mouth daily.   prednisoLONE acetate 1 % ophthalmic suspension Commonly known as:  PRED FORTE Place 1 drop into both eyes 2 (two) times daily.   rivaroxaban 20 MG Tabs tablet Commonly known as:  XARELTO Take 1 tablet (20 mg total) by mouth daily. What changed:    medication strength  how much to take  when to take this   rosuvastatin 20 MG tablet Commonly known as:  CRESTOR Take 20 mg by mouth daily.   sertraline 100 MG tablet Commonly known as:  ZOLOFT Take 100 mg by mouth daily.   sotalol 80 MG tablet Commonly known as:  BETAPACE Take 80 mg by mouth 2 (two) times daily.   spironolactone 25 MG tablet Commonly known as:  ALDACTONE Take 25 mg by mouth daily.   traMADol 50 MG tablet Commonly known as:  ULTRAM Take 1 tablet (50 mg total) by mouth every 6 (six) hours as needed.   valACYclovir 1000 MG tablet Commonly known as:  VALTREX Take 1,000 mg by mouth daily.        Additional Information SS#[id number]  Judi CongKaren M White, LCSW

## 2018-07-27 DIAGNOSIS — D62 Acute posthemorrhagic anemia: Secondary | ICD-10-CM | POA: Diagnosis not present

## 2018-07-27 LAB — CREATININE, SERUM: CREATININE: 0.88 mg/dL (ref 0.44–1.00)

## 2018-07-27 LAB — GLUCOSE, CAPILLARY
GLUCOSE-CAPILLARY: 150 mg/dL — AB (ref 70–99)
GLUCOSE-CAPILLARY: 119 mg/dL — AB (ref 70–99)
Glucose-Capillary: 125 mg/dL — ABNORMAL HIGH (ref 70–99)
Glucose-Capillary: 122 mg/dL — ABNORMAL HIGH (ref 70–99)

## 2018-07-27 LAB — CBC
HCT: 25.9 % — ABNORMAL LOW (ref 35.0–47.0)
HEMOGLOBIN: 8.5 g/dL — AB (ref 12.0–16.0)
MCH: 30.7 pg (ref 26.0–34.0)
MCHC: 32.7 g/dL (ref 32.0–36.0)
MCV: 93.7 fL (ref 80.0–100.0)
Platelets: 296 10*3/uL (ref 150–440)
RBC: 2.76 MIL/uL — AB (ref 3.80–5.20)
RDW: 17.1 % — ABNORMAL HIGH (ref 11.5–14.5)
WBC: 7.2 10*3/uL (ref 3.6–11.0)

## 2018-07-27 MED ORDER — RIVAROXABAN 20 MG PO TABS
20.0000 mg | ORAL_TABLET | Freq: Every day | ORAL | Status: DC
  Administered 2018-07-27: 20 mg via ORAL
  Filled 2018-07-27 (×3): qty 1

## 2018-07-27 MED ORDER — FERROUS SULFATE 325 (65 FE) MG PO TABS
325.0000 mg | ORAL_TABLET | Freq: Two times a day (BID) | ORAL | Status: DC
  Administered 2018-07-27 – 2018-07-28 (×2): 325 mg via ORAL
  Filled 2018-07-27 (×2): qty 1

## 2018-07-27 NOTE — Progress Notes (Signed)
[employer] [facility] - [place] at [facility]   PATIENT NAME: Virginia Barnes    MR#:  [id number]  DATE OF BIRTH:  05/13/1946  SUBJECTIVE:  CHIEF COMPLAINT:   Chief Complaint  Patient presents with  . Bleeding/Bruising   -Hemoglobin is stable this morning.  Still complains of back pain and also complains of generalized weakness.  REVIEW OF SYSTEMS:  Review of Systems  Constitutional: Negative for chills, fever and malaise/fatigue.  HENT: Negative for congestion, ear discharge, hearing loss and nosebleeds.   Eyes: Negative for blurred vision and double vision.  Respiratory: Negative for cough, shortness of breath and wheezing.   Cardiovascular: Negative for chest pain and palpitations.  Gastrointestinal: Negative for abdominal pain, constipation, diarrhea, nausea and vomiting.  Genitourinary: Negative for dysuria.  Musculoskeletal: Positive for back pain and myalgias.  Neurological: Positive for weakness. Negative for dizziness, seizures and headaches.    DRUG ALLERGIES:   Allergies  Allergen Reactions  . Peanuts [Peanut Oil] Shortness Of Breath  . Lasix [Furosemide] Swelling    Tongue swelling  . Lisinopril Swelling    Tongue swelling    VITALS:  Blood pressure (!) 108/43, pulse 77, temperature 98.5 F (36.9 C), temperature source Oral, resp. rate 18, height 5\' 9"  (1.753 m), weight 89.8 kg, SpO2 100 %.  PHYSICAL EXAMINATION:  Physical Exam  GENERAL:  72 y.o.-year-old patient lying in the bed with no acute distress.  EYES: Pupils equal, round, reactive to light and accommodation. No scleral icterus. Extraocular muscles intact.  HEENT: Head atraumatic, normocephalic. Oropharynx and nasopharynx clear.  NECK:  Supple, no jugular venous distention. No thyroid enlargement, no tenderness.  LUNGS: Normal breath sounds bilaterally, no wheezing, rales,rhonchi or crepitation. No use of accessory muscles of respiration.  Decreased bibasilar breath sounds CARDIOVASCULAR:  S1, S2 normal. No rubs, or gallops.  2/6 systolic murmur is present ABDOMEN: Soft, nontender, nondistended. Bowel sounds present. No organomegaly or mass.  Bruising noted posteriorly in the lower back region and tender to touch. EXTREMITIES: No pedal edema, cyanosis, or clubbing.  NEUROLOGIC: Cranial nerves II through XII are intact. Muscle strength 5/5 in all extremities. Sensation intact. Gait not checked. Global weakness noted. PSYCHIATRIC: The patient is alert and oriented x 3.  SKIN: No obvious rash, lesion, or ulcer.    LABORATORY PANEL:   CBC Recent Labs  Lab 07/27/18 0644  WBC 7.2  HGB 8.5*  HCT 25.9*  PLT 296   ------------------------------------------------------------------------------------------------------------------  Chemistries  Recent Labs  Lab 07/25/18 1423  NA 137  K 4.6  CL 103  CO2 27  GLUCOSE 201*  BUN 15  CREATININE 1.12*  CALCIUM 8.7*   ------------------------------------------------------------------------------------------------------------------  Cardiac Enzymes No results for input(s): TROPONINI in the last 168 hours. ------------------------------------------------------------------------------------------------------------------  RADIOLOGY:  Ct Abdomen Pelvis W Contrast  Result Date: 07/25/2018 CLINICAL DATA:  Abdominal pelvis trauma, moderate, blunt. Bruising to the low back. Marrow edema. EXAM: CT ABDOMEN AND PELVIS WITH CONTRAST TECHNIQUE: Multidetector CT imaging of the abdomen and pelvis was performed using the standard protocol following bolus administration of intravenous contrast. CONTRAST:  100mL ISOVUE-300 IOPAMIDOL (ISOVUE-300) INJECTION 61% COMPARISON:  Lumbar spine radiographs 05/13/2018. CT of the abdomen and pelvis 11/21/2015. FINDINGS: Lower chest: Mild dependent atelectasis is present bilaterally. The heart size is normal. No significant pleural or pericardial effusion is present. Hepatobiliary: No focal liver abnormality  is seen. No gallstones, gallbladder wall thickening, or biliary dilatation. Pancreas: Unremarkable. No pancreatic ductal dilatation or surrounding inflammatory changes. Spleen: Normal in size without focal  abnormality. Adrenals/Urinary Tract: Adrenal glands are normal bilaterally. Exophytic cyst posteriorly in the right kidney demonstrates interval growth. Adjacent nonobstructing punctate stone is present in the right kidney. Left kidney is unremarkable. No obstructive uropathy is present. Ureters are within normal limits bilaterally. The urinary bladder is normal. Stomach/Bowel: A moderate-sized hiatal hernia is again seen. Stomach and duodenum are otherwise normal. The small bowel is within normal limits. Terminal ileum is within normal limits. The appendix is visualized and normal. The ascending and transverse colon are within normal limits. Diverticular changes are present in the descending and sigmoid colon without focal inflammation to suggest diverticulitis. Moderate stool is present throughout the colon. Vascular/Lymphatic: Atherosclerotic calcifications are present within the aorta without aneurysm. Reproductive: Status post hysterectomy. No adnexal masses. Other: The a paraumbilical hernia contains fat without bowel. No free fluid is present. A mixed density the subcutaneous collection over the back is asymmetric to the left, extending to the medial aspect of the sacral nerve stimulator generator. The collection measures 13.9 x 14.6 x 4.0 cm. No definite drainable portion is present. Musculoskeletal: A previously demonstrated superior endplate fracture at L5 demonstrates progression without evidence for acute fracture. Vertebral body heights are otherwise maintained. Acute or healing fractures are present in the lumbar spine or pelvis. The hips are located and within normal limits bilaterally. IMPRESSION: 1. Mixed density subcutaneous collection in the back measures 13.9 x 14.6 x 4.0 cm. This is most  consistent with a large hematoma. No definite drainable portion is present. There is no peripherally enhancing collection to suggest abscess. 2. No acute fracture. 3. Superior endplate fracture at L5 demonstrates interval progression since 2016 without definite acute component. 4. Nonobstructing punctate stone in the right kidney adjacent to an enlarging posterior renal cyst. 5. Moderate-sized hiatal hernia. 6. Colonic diverticulosis without diverticulitis. Electronically Signed   By: Marin Roberts M.D.   On: 07/25/2018 16:12    EKG:   Orders placed or performed during the hospital encounter of 07/25/18  . EKG 12-Lead  . EKG 12-Lead    ASSESSMENT AND PLAN:   72 year old female from [facility] facility with past medical history significant for pulmonary embolism, paroxysmal A. fib on anticoagulation, hypertension, diabetes was brought in secondary to a fall and lower back pain.  1.  Retroperitoneal hematoma-large 14x14x4 cm hematoma in the back secondary to fall while on anticoagulation. -Xarelto was held. plavix held as well -Continue to monitor hb and transfuse if hb less than 7 or getting symptomatic -Hemoglobin now stable and >8 \- K pad -No drainable abscess noted.  Conservative management at this time. Iron supplements started orally  2.  History of pulmonary embolism-in June 2019.  Has been on Xarelto, held due to active retroperitoneal bleed.  - patient was still on 15mg  BID dosing- started xarelto today at 20mg  qdaily dosing for maintenance -Breathing is stable.  on room air  3.  Paroxysmal atrial fibrillation-heart rate is well controlled. Patient is on Betapace.  Xarelto restarted  4.  GERD-Protonix  5.-DVT prophylaxis-teds and SCDs  Physical therapy consulted- recommended rehab- patient wants to [facility] back to [facility]  Possible discharge to assisted living tomorrow if hemoglobin is stable. -Updated son over the phone   All the  records are reviewed and case discussed with Care Management/Social Workerr. Management plans discussed with the patient, family and they are in agreement.  CODE STATUS: Full code  TOTAL TIME TAKING CARE OF THIS PATIENT: 34 minutes.   POSSIBLE D/C IN  1-2 DAYS, DEPENDING ON CLINICAL CONDITION.   Enid Baas M.D on 07/27/2018 at 9:07 AM  Between 7am to 6pm - Pager - [phone]  After 6pm go to [email or web address] - password [email or web address]  [facility] [place] [place] [facility]  Office  [phone]  CC: Primary care physician; Margaretann Loveless, MD

## 2018-07-28 DIAGNOSIS — D62 Acute posthemorrhagic anemia: Secondary | ICD-10-CM | POA: Diagnosis not present

## 2018-07-28 LAB — GLUCOSE, CAPILLARY
Glucose-Capillary: 140 mg/dL — ABNORMAL HIGH (ref 70–99)
Glucose-Capillary: 135 mg/dL — ABNORMAL HIGH (ref 70–99)

## 2018-07-28 LAB — CBC
HCT: 28.2 % — ABNORMAL LOW (ref 35.0–47.0)
Hemoglobin: 9.3 g/dL — ABNORMAL LOW (ref 12.0–16.0)
MCH: 30.9 pg (ref 26.0–34.0)
MCHC: 33.1 g/dL (ref 32.0–36.0)
MCV: 93.4 fL (ref 80.0–100.0)
PLATELETS: 325 10*3/uL (ref 150–440)
RBC: 3.01 MIL/uL — AB (ref 3.80–5.20)
RDW: 17 % — AB (ref 11.5–14.5)
WBC: 7.2 10*3/uL (ref 3.6–11.0)

## 2018-07-28 MED ORDER — FERROUS SULFATE 325 (65 FE) MG PO TABS: 325.0000 mg | ORAL_TABLET | Freq: Two times a day (BID) | ORAL | 3 refills | Status: AC

## 2018-07-28 MED ORDER — RIVAROXABAN 20 MG PO TABS: 20.0000 mg | ORAL_TABLET | Freq: Every day | ORAL | 2 refills | Status: AC

## 2018-07-28 MED ORDER — FUROSEMIDE 40 MG PO TABS: 20.0000 mg | ORAL_TABLET | Freq: Every day | ORAL | 2 refills | Status: AC

## 2018-07-28 MED ORDER — TRAMADOL HCL 50 MG PO TABS: 50.0000 mg | ORAL_TABLET | Freq: Four times a day (QID) | ORAL | 0 refills | Status: AC | PRN

## 2018-07-28 NOTE — Care Management (Signed)
Rosey Batheresa with [facility] notified that patient will be returning to [facility] today with resumption of care.

## 2018-07-28 NOTE — Progress Notes (Signed)
Virginia Barnes  A and O x 4. VSS. Pt tolerating diet well. No complaints of pain or nausea. IV removed intact, prescriptions given. Pt voiced understanding of discharge instructions with no further questions. Pt discharged via EMS to [facility].     Allergies as of 07/28/2018      Reactions   Peanuts [peanut Oil] Shortness Of Breath   Lasix [furosemide] Swelling   Tongue swelling   Lisinopril Swelling   Tongue swelling      Medication List    STOP taking these medications   ferrous sulfate 325 (65 FE) MG EC tablet Replaced by:  ferrous sulfate 325 (65 FE) MG tablet     TAKE these medications   CALCIUM 600/VITAMIN D 600-400 MG-UNIT Tabs Generic drug:  Calcium Carbonate-Vitamin D3 Take 1 tablet by mouth 2 (two) times daily.   carbidopa-levodopa 25-100 MG tablet Commonly known as:  SINEMET IR Take 1 tablet by mouth 3 (three) times daily.   cetirizine 10 MG tablet Commonly known as:  ZYRTEC Take 10 mg by mouth daily.   ferrous sulfate 325 (65 FE) MG tablet Take 1 tablet (325 mg total) by mouth 2 (two) times daily with a meal. Replaces:  ferrous sulfate 325 (65 FE) MG EC tablet   fesoterodine 8 MG Tb24 tablet Commonly known as:  TOVIAZ Take 8 mg by mouth daily.   fluorometholone 0.1 % ophthalmic suspension Commonly known as:  FML Place 1 drop into the right eye 2 (two) times daily.   fluticasone 50 MCG/ACT nasal spray Commonly known as:  FLONASE Place 2 sprays into both nostrils daily.   furosemide 40 MG tablet Commonly known as:  LASIX Take 0.5 tablets (20 mg total) by mouth daily.   hydrALAZINE 25 MG tablet Commonly known as:  APRESOLINE Take 25 mg by mouth 2 (two) times daily.   ipratropium 0.03 % nasal spray Commonly known as:  ATROVENT Place 2 sprays into both nostrils 3 (three) times daily as needed for rhinitis.   NOVOLOG FLEXPEN 100 UNIT/ML FlexPen Generic drug:  insulin aspart Inject 0-10 Units into the skin 3 (three) times daily with meals. <60  Call MD 151-200 - 2 units 201-250 4 units 251-300 - 6 units 301-350 - 8 units 351-400 - 10 units >400 Call MD   omeprazole 40 MG capsule Commonly known as:  PRILOSEC Take 1 capsule by mouth daily.   potassium chloride 10 MEQ tablet Commonly known as:  K-DUR,KLOR-CON Take 10 mEq by mouth daily.   prednisoLONE acetate 1 % ophthalmic suspension Commonly known as:  PRED FORTE Place 1 drop into both eyes 2 (two) times daily.   rivaroxaban 20 MG Tabs tablet Commonly known as:  XARELTO Take 1 tablet (20 mg total) by mouth daily. What changed:    medication strength  how much to take  when to take this   rosuvastatin 20 MG tablet Commonly known as:  CRESTOR Take 20 mg by mouth daily.   sertraline 100 MG tablet Commonly known as:  ZOLOFT Take 100 mg by mouth daily.   sotalol 80 MG tablet Commonly known as:  BETAPACE Take 80 mg by mouth 2 (two) times daily.   spironolactone 25 MG tablet Commonly known as:  ALDACTONE Take 25 mg by mouth daily.   traMADol 50 MG tablet Commonly known as:  ULTRAM Take 1 tablet (50 mg total) by mouth every 6 (six) hours as needed.   valACYclovir 1000 MG tablet Commonly known as:  VALTREX Take 1,000 mg  by mouth daily.       Vitals:   07/28/18 1131 07/28/18 1434  BP: (!) 107/45 (!) 131/56  Pulse: 75 84  Resp: 18 18  Temp: 98.6 F (37 C) 98.1 F (36.7 C)  SpO2: 100% 98%    Virginia Barnes

## 2018-07-28 NOTE — Discharge Summary (Signed)
[employer] - [place] at [facility]   PATIENT NAME: Virginia Barnes    MR#:  [id number]  DATE OF BIRTH:  11-09-1946  DATE OF ADMISSION:  07/25/2018   ADMITTING PHYSICIAN: Bertrum Sol, MD  DATE OF DISCHARGE:  07/28/18  PRIMARY CARE PHYSICIAN: Margaretann Loveless, MD   ADMISSION DIAGNOSIS:   Acute blood loss anemia [D62] Fall, initial encounter [W19.XXXA] Traumatic hematoma of lower back, initial encounter [S30.0XXA]  DISCHARGE DIAGNOSIS:   Active Problems:   Hematoma   SECONDARY DIAGNOSIS:   Past Medical History:  Diagnosis Date  . Arrhythmia    atrial fibrillation  . CHF (congestive heart failure) (HCC)   . Diabetes mellitus without complication (HCC)   . Diverticulitis   . Frequent falls   . Hypertension     HOSPITAL COURSE:   72 year old female from [facility] facility with past medical history significant for pulmonary embolism, paroxysmal A. fib on anticoagulation, hypertension, diabetes was brought in secondary to a fall.  1.  Retroperitoneal hematoma-large 14x14x4 cm hematoma in the back secondary to fall while on anticoagulation. -Xarelto was held initially.  -No further bleeding.  Hemoglobin has remained stable.  Did not receive any transfusion this admission.  Pain and bruising are much improved -K pad as needed -No drainable abscess noted.  Conservative management at this time. Iron supplements orally to help with her anemia  2.  History of pulmonary embolism-in June 2019.  Has been on Xarelto, held due to active retroperitoneal bleed on admission.  -It seems like though her PE was in June 2019, her admission medications still shows that she is taking 15 mg twice daily dosing.  Now that her hemoglobin is stable, Xarelto restarted at the maintenance dose of 20 mg daily.  -Breathing is stable.  on room air  3.  Paroxysmal atrial fibrillation-heart rate is well controlled. Patient is on Betapace.  Xarelto restarted     4.  GERD-PPI  Patient feeling much better.  She is back to her baseline.  Needs assistance to get into wheelchair at baseline.  Will be discharged to  house assisted living facility today   DISCHARGE CONDITIONS:   Guarded  CONSULTS OBTAINED:   None  DRUG ALLERGIES:   Allergies  Allergen Reactions  . Peanuts [Peanut Oil] Shortness Of Breath  . Lasix [Furosemide] Swelling    Tongue swelling  . Lisinopril Swelling    Tongue swelling   DISCHARGE MEDICATIONS:   Allergies as of 07/28/2018      Reactions   Peanuts [peanut Oil] Shortness Of Breath   Lasix [furosemide] Swelling   Tongue swelling   Lisinopril Swelling   Tongue swelling      Medication List    STOP taking these medications   ferrous sulfate 325 (65 FE) MG EC tablet Replaced by:  ferrous sulfate 325 (65 FE) MG tablet     TAKE these medications   CALCIUM 600/VITAMIN D 600-400 MG-UNIT Tabs Generic drug:  Calcium Carbonate-Vitamin D3 Take 1 tablet by mouth 2 (two) times daily.   carbidopa-levodopa 25-100 MG tablet Commonly known as:  SINEMET IR Take 1 tablet by mouth 3 (three) times daily.   cetirizine 10 MG tablet Commonly known as:  ZYRTEC Take 10 mg by mouth daily.   ferrous sulfate 325 (65 FE) MG tablet Take 1 tablet (325 mg total) by mouth 2 (two) times daily with a meal. Replaces:  ferrous sulfate 325 (65 FE) MG EC tablet   fesoterodine 8 MG  Tb24 tablet Commonly known as:  TOVIAZ Take 8 mg by mouth daily.   fluorometholone 0.1 % ophthalmic suspension Commonly known as:  FML Place 1 drop into the right eye 2 (two) times daily.   fluticasone 50 MCG/ACT nasal spray Commonly known as:  FLONASE Place 2 sprays into both nostrils daily.   furosemide 40 MG tablet Commonly known as:  LASIX Take 0.5 tablets (20 mg total) by mouth daily.   hydrALAZINE 25 MG tablet Commonly known as:  APRESOLINE Take 25 mg by mouth 2 (two) times daily.   ipratropium 0.03 % nasal spray Commonly  known as:  ATROVENT Place 2 sprays into both nostrils 3 (three) times daily as needed for rhinitis.   NOVOLOG FLEXPEN 100 UNIT/ML FlexPen Generic drug:  insulin aspart Inject 0-10 Units into the skin 3 (three) times daily with meals. <60 Call MD 151-200 - 2 units 201-250 4 units 251-300 - 6 units 301-350 - 8 units 351-400 - 10 units >400 Call MD   omeprazole 40 MG capsule Commonly known as:  PRILOSEC Take 1 capsule by mouth daily.   potassium chloride 10 MEQ tablet Commonly known as:  K-DUR,KLOR-CON Take 10 mEq by mouth daily.   prednisoLONE acetate 1 % ophthalmic suspension Commonly known as:  PRED FORTE Place 1 drop into both eyes 2 (two) times daily.   rivaroxaban 20 MG Tabs tablet Commonly known as:  XARELTO Take 1 tablet (20 mg total) by mouth daily. What changed:    medication strength  how much to take  when to take this   rosuvastatin 20 MG tablet Commonly known as:  CRESTOR Take 20 mg by mouth daily.   sertraline 100 MG tablet Commonly known as:  ZOLOFT Take 100 mg by mouth daily.   sotalol 80 MG tablet Commonly known as:  BETAPACE Take 80 mg by mouth 2 (two) times daily.   spironolactone 25 MG tablet Commonly known as:  ALDACTONE Take 25 mg by mouth daily.   traMADol 50 MG tablet Commonly known as:  ULTRAM Take 1 tablet (50 mg total) by mouth every 6 (six) hours as needed.   valACYclovir 1000 MG tablet Commonly known as:  VALTREX Take 1,000 mg by mouth daily.        DISCHARGE INSTRUCTIONS:   1. PCP f/u in 1-2 weeks  DIET:   Cardiac diet  ACTIVITY:   Activity as tolerated  OXYGEN:   Home Oxygen: No.  Oxygen Delivery: room air  DISCHARGE LOCATION:   [facility] facility   If you experience worsening of your admission symptoms, develop shortness of breath, life threatening emergency, suicidal or homicidal thoughts you must seek medical attention immediately by calling 911 or calling your MD immediately  if  symptoms less severe.  You Must read complete instructions/literature along with all the possible adverse reactions/side effects for all the Medicines you take and that have been prescribed to you. Take any new Medicines after you have completely understood and accpet all the possible adverse reactions/side effects.   Please note  You were cared for by a hospitalist during your hospital stay. If you have any questions about your discharge medications or the care you received while you were in the hospital after you are discharged, you can call the unit and asked to speak with the hospitalist on call if the hospitalist that took care of you is not available. Once you are discharged, your primary care physician will handle any further medical issues. Please note that  NO REFILLS for any discharge medications will be authorized once you are discharged, as it is imperative that you return to your primary care physician (or establish a relationship with a primary care physician if you do not have one) for your aftercare needs so that they can reassess your need for medications and monitor your lab values.    On the day of Discharge:  VITAL SIGNS:   Blood pressure (!) 107/45, pulse 75, temperature 98.6 F (37 C), temperature source Oral, resp. rate 18, height 5\' 9"  (1.753 m), weight 89.8 kg, SpO2 100 %.  PHYSICAL EXAMINATION:    GENERAL:  72 y.o.-year-old patient lying in the bed with no acute distress.  EYES: Pupils equal, round, reactive to light and accommodation. No scleral icterus. Extraocular muscles intact.  HEENT: Head atraumatic, normocephalic. Oropharynx and nasopharynx clear.  NECK:  Supple, no jugular venous distention. No thyroid enlargement, no tenderness.  LUNGS: Normal breath sounds bilaterally, no wheezing, rales,rhonchi or crepitation. No use of accessory muscles of respiration.  Decreased bibasilar breath sounds CARDIOVASCULAR: S1, S2 normal. No rubs, or gallops.  2/6 systolic  murmur is present ABDOMEN: Soft, nontender, nondistended. Bowel sounds present. No organomegaly or mass.  Bruising noted posteriorly in the lower back region and tender to touch. EXTREMITIES: No pedal edema, cyanosis, or clubbing.  NEUROLOGIC: Cranial nerves II through XII are intact. Muscle strength 5/5 in all extremities. Sensation intact. Gait not checked. Global weakness noted. PSYCHIATRIC: The patient is alert and oriented x 3.  SKIN: No obvious rash, lesion, or ulcer  DATA REVIEW:   CBC Recent Labs  Lab 07/28/18 0511  WBC 7.2  HGB 9.3*  HCT 28.2*  PLT 325    Chemistries  Recent Labs  Lab 07/25/18 1423 07/27/18 0644  NA 137  --   K 4.6  --   CL 103  --   CO2 27  --   GLUCOSE 201*  --   BUN 15  --   CREATININE 1.12* 0.88  CALCIUM 8.7*  --      Microbiology Results  Results for orders placed or performed during the hospital encounter of 07/25/18  MRSA PCR Screening     Status: None   Collection Time: 07/25/18  8:11 PM  Result Value Ref Range Status   MRSA by PCR NEGATIVE NEGATIVE Final    Comment:        The GeneXpert MRSA Assay (FDA approved for NASAL specimens only), is one component of a comprehensive MRSA colonization surveillance program. It is not intended to diagnose MRSA infection nor to guide or monitor treatment for MRSA infections. Performed at [facility], [address] [place], [place] [address]     RADIOLOGY:  No results found.   Management plans discussed with the patient, family and they are in agreement.  CODE STATUS:     Code Status Orders  (From admission, onward)         Start     Ordered   07/25/18 1957  Full code  Continuous     07/25/18 1956        Code Status History    Date Active Date Inactive Code Status Order ID Comments User Context   06/01/2018 0126 06/04/2018 2143 Full Code [id number]  Oralia Manis, MD ED   01/30/2018 1826 02/06/2018 2049 Full Code [id number]  Ihor Austin, MD ED   01/12/2018 1118  01/13/2018 1939 Full Code [id number]  Houston Siren, MD Inpatient      TOTAL TIME  TAKING CARE OF THIS PATIENT: 38  minutes.    Desarie Feild M.D on 07/28/2018 at 12:30 PM  Between 7am to 6pm - Pager - [phone]  After 6pm go to [email or web address] - Social research officer, [email or web address] [email or web address]  [facility] [place] [facility]  Office  [phone]  CC: Primary care physician; Margaretann LovelessKhan, Neelam S, MD   Note: This dictation was prepared with Dragon dictation along with smaller phrase technology. Any transcriptional errors that result from this process are unintentional.

## 2018-07-28 NOTE — Clinical Social Work Note (Signed)
Patient's son, [name], called CSW back and informed CSW that they would prefer transport via EMS. York SpanielMonica Pattiann Solanki MSW,LCSW [phone]

## 2018-07-28 NOTE — Progress Notes (Signed)
RN call report to [facility]. Report given to [employer] ServicesLynnet Byrnett med tech.

## 2018-07-28 NOTE — Clinical Social Work Note (Signed)
Patient to discharge today to return to [facility]. CSW spoke with Nehemiah SettleBrooke at [facility] and made her aware of patient's mobility limitations. Nehemiah SettleBrooke states that they will accept patient back and that they prefer [facility]. RN CM has arranged this. Patient's daughter in law, Annabelle HarmanDana, will call CSW back to inform about transport. York SpanielMonica Gailen Venne MSW,LCSW [phone]

## 2018-08-23 ENCOUNTER — Emergency Department: Payer: Medicare HMO

## 2018-08-23 ENCOUNTER — Other Ambulatory Visit: Payer: Self-pay

## 2018-08-23 ENCOUNTER — Emergency Department
Admission: EM | Admit: 2018-08-23 | Discharge: 2018-08-23 | Disposition: A | Discharge status: Home / Self Care | Payer: Medicare HMO | Attending: Emergency Medicine | Admitting: Emergency Medicine

## 2018-08-23 DIAGNOSIS — I5032 Chronic diastolic (congestive) heart failure: Secondary | ICD-10-CM | POA: Diagnosis not present

## 2018-08-23 DIAGNOSIS — I110 Hypertensive heart disease with heart failure: Secondary | ICD-10-CM | POA: Insufficient documentation

## 2018-08-23 DIAGNOSIS — Z79899 Other long term (current) drug therapy: Secondary | ICD-10-CM | POA: Diagnosis not present

## 2018-08-23 DIAGNOSIS — Y999 Unspecified external cause status: Secondary | ICD-10-CM | POA: Diagnosis not present

## 2018-08-23 DIAGNOSIS — Y93E8 Activity, other personal hygiene: Secondary | ICD-10-CM | POA: Insufficient documentation

## 2018-08-23 DIAGNOSIS — Z7901 Long term (current) use of anticoagulants: Secondary | ICD-10-CM | POA: Insufficient documentation

## 2018-08-23 DIAGNOSIS — Z794 Long term (current) use of insulin: Secondary | ICD-10-CM | POA: Diagnosis not present

## 2018-08-23 DIAGNOSIS — S42102A Fracture of unspecified part of scapula, left shoulder, initial encounter for closed fracture: Secondary | ICD-10-CM | POA: Diagnosis not present

## 2018-08-23 DIAGNOSIS — Z87891 Personal history of nicotine dependence: Secondary | ICD-10-CM | POA: Diagnosis not present

## 2018-08-23 DIAGNOSIS — Y92002 Bathroom of unspecified non-institutional (private) residence single-family (private) house as the place of occurrence of the external cause: Secondary | ICD-10-CM | POA: Insufficient documentation

## 2018-08-23 DIAGNOSIS — E119 Type 2 diabetes mellitus without complications: Secondary | ICD-10-CM | POA: Insufficient documentation

## 2018-08-23 DIAGNOSIS — S4992XA Unspecified injury of left shoulder and upper arm, initial encounter: Secondary | ICD-10-CM | POA: Diagnosis present

## 2018-08-23 DIAGNOSIS — W050XXA Fall from non-moving wheelchair, initial encounter: Secondary | ICD-10-CM | POA: Diagnosis not present

## 2018-08-23 DIAGNOSIS — E8770 Fluid overload, unspecified: Secondary | ICD-10-CM

## 2018-08-23 LAB — BASIC METABOLIC PANEL
ANION GAP: 8 (ref 5–15)
BUN: 13 mg/dL (ref 8–23)
CHLORIDE: 106 mmol/L (ref 98–111)
CO2: 26 mmol/L (ref 22–32)
Calcium: 9.3 mg/dL (ref 8.9–10.3)
Creatinine, Ser: 0.83 mg/dL (ref 0.44–1.00)
GFR calc Af Amer: >60 mL/min (ref >60)
GLUCOSE: 134 mg/dL — AB (ref 70–99)
POTASSIUM: 4.8 mmol/L (ref 3.5–5.1)
Sodium: 140 mmol/L (ref 135–145)

## 2018-08-23 LAB — BRAIN NATRIURETIC PEPTIDE: B Natriuretic Peptide: 404 pg/mL — ABNORMAL HIGH (ref 0.0–100.0)

## 2018-08-23 LAB — CBC
HEMATOCRIT: 36.4 % (ref 35.0–47.0)
HEMOGLOBIN: 11.6 g/dL — AB (ref 12.0–16.0)
MCH: 31 pg (ref 26.0–34.0)
MCHC: 32 g/dL (ref 32.0–36.0)
MCV: 96.9 fL (ref 80.0–100.0)
PLATELETS: 243 10*3/uL (ref 150–440)
RBC: 3.75 MIL/uL — AB (ref 3.80–5.20)
RDW: 16.7 % — ABNORMAL HIGH (ref 11.5–14.5)
WBC: 5.7 10*3/uL (ref 3.6–11.0)

## 2018-08-23 MED ORDER — FUROSEMIDE 40 MG PO TABS
40.0000 mg | ORAL_TABLET | Freq: Once | ORAL | Status: AC
  Administered 2018-08-23: 40 mg via ORAL
  Filled 2018-08-23: qty 1

## 2018-08-23 MED ORDER — FENTANYL CITRATE (PF) 100 MCG/2ML IJ SOLN
50.0000 ug | Freq: Once | INTRAMUSCULAR | Status: AC
  Administered 2018-08-23: 50 ug via INTRAVENOUS
  Filled 2018-08-23: qty 2

## 2018-08-23 MED ORDER — HYDROCODONE-ACETAMINOPHEN 5-325 MG PO TABS: 1.0000 | ORAL_TABLET | Freq: Four times a day (QID) | ORAL | 0 refills | Status: DC | PRN

## 2018-08-23 MED ORDER — FENTANYL CITRATE (PF) 100 MCG/2ML IJ SOLN
25.0000 ug | Freq: Once | INTRAMUSCULAR | Status: AC
  Administered 2018-08-23: 25 ug via INTRAVENOUS
  Filled 2018-08-23: qty 2

## 2018-08-23 NOTE — Progress Notes (Signed)
[facility] and he is on his way.  Delta Air LinesClaudine Pranika Finks LCSW [phone]

## 2018-08-23 NOTE — ED Provider Notes (Signed)
[facility] Emergency Department Provider Note   ____________________________________________   First MD Initiated Contact with Patient 08/23/18 [id number]     (approximate)  I have reviewed the triage vital signs and the nursing notes.   HISTORY  Chief Complaint Fall and Shoulder Pain    HPI FELINA TELLO is a 72 y.o. female here for evaluation of pain in her left shoulder after falling  The patient reports that she was getting up from the toilet to her wheelchair, as she was leaning forward to get to the wheelchair she stumbled forward, reports that she got her legs caught under her.  She fell striking her right side against the side of the bathroom, but reports she then hit her left shoulder quite hard on the toilet seat has been having pain over the middle of the left upper arm.  Denies pain in the wrists or forearms or elbows.  No pain in her legs or hips.  Reports only pain right now is in the middle of the left upper arm and shoulder region.  No chest pain.  No headache.  No nausea vomiting.  No neck pain.     Past Medical History:  Diagnosis Date  . Arrhythmia    atrial fibrillation  . CHF (congestive heart failure) ([place])   . Diabetes mellitus without complication ([place])   . Diverticulitis   . Frequent falls   . Hypertension     Patient Active Problem List   Diagnosis Date Noted  . Hematoma 07/25/2018  . 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 Chronic diastolic heart failure ([place]) 02/17/2018  . HTN (hypertension) 02/17/2018  . PAF (paroxysmal atrial fibrillation) ([place]) 02/17/2018  . Lymphedema 02/17/2018  . Diabetes ([place]) 02/17/2018  . Sepsis ([place]) 01/30/2018  . Hypoglycemia 01/12/2018  . Knee pain 05/17/2017    Past Surgical History:  Procedure Laterality Date  . ABDOMINAL HYSTERECTOMY    . CARDIAC CATHETERIZATION Left 04/13/2016   Procedure: Left Heart Cath and Coronary Angiography;  Surgeon: Dionisio David, MD;  Location: [facility];  Service: Cardiovascular;  Laterality: Left;  . JOINT REPLACEMENT      Prior to Admission medications   Medication Sig Start Date End Date Taking? Authorizing Provider  Calcium Carbonate-Vitamin D3 (CALCIUM 600/VITAMIN D) 600-400 MG-UNIT TABS Take 1 tablet by mouth 2 (two) times daily.    [provider]  carbidopa-levodopa (SINEMET IR) 25-100 MG tablet Take 1 tablet by mouth 3 (three) times daily.    [provider]  cetirizine (ZYRTEC) 10 MG tablet Take 10 mg by mouth daily.     [provider]  ferrous sulfate 325 (65 FE) MG tablet Take 1 tablet (325 mg total) by mouth 2 (two) times daily with a meal. 07/28/18   Gladstone Lighter, MD  fesoterodine (TOVIAZ) 8 MG TB24 tablet Take 8 mg by mouth daily.    [provider]  fluorometholone (FML) 0.1 % ophthalmic suspension Place 1 drop into the right eye 2 (two) times daily.    [provider]  fluticasone (FLONASE) 50 MCG/ACT nasal spray Place 2 sprays into both nostrils daily.    [provider]  furosemide (LASIX) 40 MG tablet Take 0.5 tablets (20 mg total) by mouth daily. 07/28/18 07/28/19  Gladstone Lighter, MD  hydrALAZINE (APRESOLINE) 25 MG tablet Take 25 mg by mouth 2 (two) times daily.    [provider]  HYDROcodone-acetaminophen (NORCO/VICODIN) 5-325 MG tablet Take 1 tablet by mouth every 6 (  six) hours as needed for severe pain. 08/23/18   Delman Kitten, MD  ipratropium (ATROVENT) 0.03 % nasal spray Place 2 sprays into both nostrils 3 (three) times daily as needed for rhinitis.    [provider]  NOVOLOG FLEXPEN 100 UNIT/ML FlexPen Inject 0-10 Units into the skin 3 (three) times daily with meals. <60 Call MD 151-200 - 2 units 201-250 4 units 251-300 - 6 units 301-350 - 8 units 351-400 - 10 units >400 Call MD 05/23/18   [provider]  omeprazole (PRILOSEC) 40 MG capsule Take 1 capsule by mouth daily. 09/26/15   [provider]    potassium chloride (K-DUR,KLOR-CON) 10 MEQ tablet Take 10 mEq by mouth daily. 05/09/18   [provider]  prednisoLONE acetate (PRED FORTE) 1 % ophthalmic suspension Place 1 drop into both eyes 2 (two) times daily.     [provider]  rivaroxaban (XARELTO) 20 MG TABS tablet Take 1 tablet (20 mg total) by mouth daily. 07/28/18   Gladstone Lighter, MD  rosuvastatin (CRESTOR) 20 MG tablet Take 20 mg by mouth daily.    [provider]  sertraline (ZOLOFT) 100 MG tablet Take 100 mg by mouth daily.    [provider]  sotalol (BETAPACE) 80 MG tablet Take 80 mg by mouth 2 (two) times daily.    [provider]  spironolactone (ALDACTONE) 25 MG tablet Take 25 mg by mouth daily.     [provider]  traMADol (ULTRAM) 50 MG tablet Take 1 tablet (50 mg total) by mouth every 6 (six) hours as needed. 07/28/18 07/28/19  Gladstone Lighter, MD  valACYclovir (VALTREX) 1000 MG tablet Take 1,000 mg by mouth daily.    [provider]    Allergies Peanuts [peanut oil]; Lasix [furosemide]; and Lisinopril  Family History  Problem Relation Age of Onset  . Stomach cancer Sister 66  . Vaginal cancer Sister 61  . Heart disease Father   . Breast cancer Neg Hx     Social History Social History   Tobacco Use  . Smoking status: Former Smoker    Packs/day: 0.50    Years: 15.00    Pack years: 7.50    Types: Cigarettes  . Smokeless tobacco: Never Used  Substance Use Topics  . Alcohol use: No  . Drug use: No    Review of Systems Constitutional: No fever/chills Eyes: No visual changes. ENT: No sore throat.  Denies neck pain.  No numbness weakness or tingling. Cardiovascular: Denies chest pain. Respiratory: Denies shortness of breath. Gastrointestinal: No abdominal pain.  No nausea, no vomiting.  No diarrhea.  No constipation. Genitourinary: Negative for dysuria. Musculoskeletal: Negative for back pain.  See HPI. Skin: Negative for  rash. Neurological: Negative for headaches, focal weakness or numbness.    ____________________________________________   PHYSICAL EXAM:  VITAL SIGNS: ED Triage Vitals  Enc Vitals Group     BP 08/23/18 0948 (!) 153/75     Pulse Rate 08/23/18 0948 64     Resp 08/23/18 0948 16     Temp 08/23/18 0948 98 F (36.7 C)     Temp Source 08/23/18 0948 Oral     SpO2 08/23/18 0948 98 %     Weight 08/23/18 0949 198 lb (89.8 kg)     Height 08/23/18 0949 5' 9"  (1.753 m)     Head Circumference --      Peak Flow --      Pain Score 08/23/18 0949 9     Pain  Loc --      Pain Edu? --      Excl. in [place]? --     Constitutional: Alert and oriented. Well appearing and in no acute distress. Eyes: Conjunctivae are normal. Head: Atraumatic. Nose: No congestion/rhinnorhea. Mouth/Throat: Mucous membranes are moist. Neck: No stridor.   Cardiovascular: Normal rate, regular rhythm. Grossly normal heart sounds.  Good peripheral circulation. Respiratory: Normal respiratory effort.  No retractions. Lungs CTAB. Gastrointestinal: Soft and nontender. No distention. Musculoskeletal: Tenderness moderate over the left posterior thoracic region and shoulder blade.  Lower Extremities  No edema. Normal DP/PT pulses bilateral with good cap refill.  Normal neuro-motor function lower extremities bilateral.  RIGHT Right lower extremity demonstrates normal strength, good use of all muscles. No edema bruising or contusions of the right hip, right knee, right ankle. Full range of motion of the right lower extremity without pain. No pain on axial loading. No evidence of trauma.  LEFT Left lower extremity demonstrates normal strength, good use of all muscles. No edema bruising or contusions of the hip,  knee, ankle. Full range of motion of the left lower extremity without pain. No pain on axial loading. No evidence of trauma.  RIGHT Right upper extremity demonstrates normal strength, good use of all muscles. No edema  bruising or contusions of the right shoulder/upper arm, right elbow, right forearm / hand. Full range of motion of the right right upper extremity without pain. No evidence of trauma. Strong radial pulse. Intact median/ulnar/radial neuro-muscular exam.  LEFT Left upper extremity demonstrates limited strength due to pain in the area of the left shoulder and mid left humerus, good use of all muscles. No edema bruising or contusions of the left  left elbow, left forearm / hand.  The patient has focal tenderness over the left shoulder and left mid humerus with some slight swelling over the area of the left triceps.  There is no redness or overlying lesion.  Patient reports her pain and tenderness are located primarily in the area of the proximal third of the left humerus.  Slight swelling in this area as well.  Strong radial pulse. Intact median/ulnar/radial neuro-muscular exam.   Neurologic:  Normal speech and language. No gross focal neurologic deficits are appreciated.  Skin:  Skin is warm, dry and intact. No rash noted. Psychiatric: Mood and affect are normal. Speech and behavior are normal.  ____________________________________________   LABS (all labs ordered are listed, but only abnormal results are displayed)  Labs Reviewed  CBC - Abnormal; Notable for the following components:      Result Value   RBC 3.75 (*)    Hemoglobin 11.6 (*)    RDW 16.7 (*)    All other components within normal limits  BASIC METABOLIC PANEL - Abnormal; Notable for the following components:   Glucose, Bld 134 (*)    All other components within normal limits  BRAIN NATRIURETIC PEPTIDE - Abnormal; Notable for the following components:   B Natriuretic Peptide 404.0 (*)    All other components within normal limits  CBG MONITORING, ED   ____________________________________________  EKG  I reviewed and entered by me at 1055 Heart rate 70 QRS 95 QTc 400 Normal sinus, possible right ventricular hypertrophy or  old posterior infarct.  Compared with previous no notable change ____________________________________________  RADIOLOGY  Dg Chest 2 View  Result Date: 08/23/2018 CLINICAL DATA:  Left shoulder pain secondary to a fall in the bathroom this morning. EXAM: CHEST - 2 VIEW COMPARISON:  Chest x-rays  dated 05/31/2018 and 01/11/2018 FINDINGS: There is a comminuted fracture of the left scapula. Heart size is at the upper limits of normal considering the AP technique. Aortic atherosclerosis. Slight pulmonary vascular congestion, new since the prior study. Tiny bilateral effusions. Chronic accentuation of the thoracic kyphosis. IMPRESSION: 1. Comminuted fracture of the left scapula. 2. New pulmonary vascular congestion with tiny bilateral pleural effusions. 3.  Aortic Atherosclerosis (ICD10-I70.0). Electronically Signed   By: Lorriane Shire M.D.   On: 08/23/2018 10:24   Ct Head Wo Contrast  Result Date: 08/23/2018 CLINICAL DATA:  Fall, on anticoagulation EXAM: CT HEAD WITHOUT CONTRAST TECHNIQUE: Contiguous axial images were obtained from the base of the skull through the vertex without intravenous contrast. COMPARISON:  None. FINDINGS: Brain: No evidence of acute infarction, hemorrhage, hydrocephalus, extra-axial collection or mass lesion/mass effect. Subcortical white matter and periventricular small vessel ischemic changes. Vascular: Intracranial atherosclerosis. Skull: Normal. Negative for fracture or focal lesion. Sinuses/Orbits: The visualized paranasal sinuses are essentially clear. The mastoid air cells are unopacified. Other: None. IMPRESSION: No evidence of acute intracranial abnormality. Small vessel ischemic changes. Electronically Signed   By: Julian Hy M.D.   On: 08/23/2018 10:30   Ct Shoulder Left Wo Contrast  Result Date: 08/23/2018 CLINICAL DATA:  Fall in bathroom this morning.  Scapular fractures. EXAM: CT OF THE UPPER LEFT EXTREMITY WITHOUT CONTRAST TECHNIQUE: Multidetector CT imaging  of the left shoulder and scapula was performed according to the standard protocol. COMPARISON:  Radiographs same date. FINDINGS: Bones/Joint/Cartilage There is an extensively comminuted and displaced fracture of the left scapular body. There is no involvement of the glenoid, coracoid process or acromion. The humeral head is located and intact. The left clavicle is intact. There are mild acromioclavicular degenerative changes. No rib fractures are seen. Ligaments Suboptimally assessed by CT. Muscles and Tendons There is only mild soft tissue swelling in the subscapularis and teres minor tendons. No focal hematoma. The subacromial space of the left shoulder is mildly narrowed without gross rotator cuff tear. Soft tissues No focal hematoma or significant joint effusion. There is aortic and branch vessel atherosclerosis. IMPRESSION: 1. Extensively comminuted and displaced extra-articular fracture of the left scapular body. No involvement of the glenoid, acromion or coracoid process. 2. Located and intact proximal left humerus. 3. No significant periarticular hematoma demonstrated. Electronically Signed   By: Richardean Sale M.D.   On: 08/23/2018 12:01   Dg Humerus Left  Result Date: 08/23/2018 CLINICAL DATA:  Left shoulder pain secondary to a fall in the bathroom this morning. EXAM: LEFT HUMERUS - 2+ VIEW COMPARISON:  None. FINDINGS: There are comminuted angulated displaced fractures of the scapula. Glenohumeral joint is intact. No dislocation. Humerus is intact. Scalloping of the undersurface of the acromion is suggestive of rotator cuff disease. IMPRESSION: Extensive left scapular fractures as described. The humerus is intact and not dislocated. Electronically Signed   By: Lorriane Shire M.D.   On: 08/23/2018 10:22    ____________________________________________   PROCEDURES  Procedure(s) performed: None  Procedures  Critical Care performed:  No  ____________________________________________   INITIAL IMPRESSION / ASSESSMENT AND PLAN / ED COURSE  Pertinent labs & imaging results that were available during my care of the patient were reviewed by me and considered in my medical decision making (see chart for details).  Patient presents for evaluation after what sounds like a mechanical fall.  She denies preceding acute symptoms.  Her normal state of health.  No shortness of breath or chest pain.  No neurologic  symptoms, but complains of left upper arm left shoulder and back discomfort  Clinical Course as of Aug 23 1510  Sat Aug 23, 2018  1327 Seen by social work, working with [facility] to see if patient care needs will be able to be met there given her nonweightbearing of the left upper extremity and wheelchair use.  Patient understanding, agreeable to waiting as we coordinate plans for her care needs.   [MQ]    Clinical Course User Index [MQ] Delman Kitten, MD   Fall.  Exam reassuring except for focal discomfort involving the left humerus.  Denies cardiac neurologic or pulmonary symptoms.  Reassuring vital signs.  Fully awake and alert.  She denies head injury, but given her fall and known use of an anticoagulant I have ordered a head CT to exclude intercranial hemorrhage given the patient's age and being anticoagulated.  Chest x-ray, left shoulder film.  Pain is well controlled after fentanyl with EMS.  Negative Nexus for cervical injury, and she does not appear to be in severe pain or have distracting injury.  No preceding symptoms, does report she falls frequently.  Appears to be mechanical in nature  ----------------------------------------- 11:14 AM on 08/23/2018 -----------------------------------------  X-ray discussed with Dr. Posey Pronto, he recommends obtaining a CT of the left shoulder and scapula.  Have ordered.  Discussed with patient she reports pain is increasing again and appears uncomfortable in her left shoulder  region, have ordered additional fentanyl at this time.  She is fully awake and alert with normal vital signs.  ----------------------------------------- 3:10 PM on 08/23/2018 -----------------------------------------  [place], patient will be able to return to [place] and have appropriate care to assist her with use of the wheelchair and nonweightbearing left upper extremity confirmed by Education officer, museum.  Patient agreeable with plan for discharge, will discharge with a short course of pain medication for which the patient is in agreement.  Patient understanding and agreeable with plan of care and follow-up.  Also based on her chest x-ray she does appear to be slightly volume overloaded with elevated BNP and will give a extra dose of her Lasix here which she is agreeable with.  She takes Lasix at home, reports she is not allergic to it but is allergic to lisinopril.  Patient will be following up with orthopedics and her primary doctor.  Normal oxygen saturations, comfortable respirations without pulmonary or cardiac symptoms. ____________________________________________   FINAL CLINICAL IMPRESSION(S) / ED DIAGNOSES  Final diagnoses:  Closed fracture of left scapula, unspecified part of scapula, initial encounter  Hypervolemia, unspecified hypervolemia type      NEW MEDICATIONS STARTED DURING THIS VISIT:  New Prescriptions   HYDROCODONE-ACETAMINOPHEN (NORCO/VICODIN) 5-325 MG TABLET    Take 1 tablet by mouth every 6 (six) hours as needed for severe pain.     Note:  This document was prepared using Dragon voice recognition software and may include unintentional dictation errors.     Delman Kitten, MD 08/23/18 1511

## 2018-08-23 NOTE — ED Notes (Signed)
Assisted pt to void on bedpan - pt requested to ambulate to toilet but was unable to stand or pick up feet to ambulate

## 2018-08-23 NOTE — ED Notes (Signed)
Patient transported to X-ray 

## 2018-08-23 NOTE — Progress Notes (Signed)
LCSW consulted with EDP and Patient and patient does not wish to go to a SNF- She wants to go back to [facility], she reports she has CNAs to assist with her ADL and she understands if she has to go to washroom she may wait a while but its the same as a SNF. Final choice patient wishes to be returned to [facility].  LCSW called facility and they do have in room CNA and Techs to assist patient with hygiene needs.  Delta Air LinesClaudine Sebastian Lurz LCSW [phone]

## 2018-08-23 NOTE — ED Notes (Signed)
Dr Fanny Bienquale back to bedside about plan of care

## 2018-08-23 NOTE — ED Notes (Signed)
Removed 4 silver rings from left hand and placed in urine specimen cup - pt has in her possession

## 2018-08-23 NOTE — Clinical Social Work Note (Signed)
Clinical Social Work Assessment  Patient Details  Name: Deeann CreeGlenda C Faughn MRN: [id number] Date of Birth: [birth year]  Date of referral:  08/23/18               Reason for consult:  Other (Comment Required)                Permission sought to share information with:  Facility Medical sales representativeContact Representative, Family Supports Permission granted to share information::  Yes, Verbal Permission Granted  Name::     Trudie BucklerMarshal and Virgel BouquetDana  [place] [phone]  Agency::  [facility]  Relationship::     Contact Information:  [facility]  Housing/Transportation Living arrangements for the past 2 months:  Assisted Living Facility Source of Information:  Patient Patient Interpreter Needed:  None Criminal Activity/Legal Involvement Pertinent to Current Situation/Hospitalization:  No - Comment as needed Significant Relationships:  Adult Children, Other Family Members, Neighbor, Church Lives with:  Facility Resident Do you feel safe going back to the place where you live?  Yes Need for family participation in patient care:  Yes (Comment)  Care giving concerns:  TBD   Social Worker assessment / plan: LCSW introduced myself to patient 72 year old female. She resides at [facility] and is oriented x4. She is widowed and  The patient has son 4 grand children and 2 great great grand kids. She reports she uses her wheelchair more that walker. She reports she has excellent family involvement.  She was in the bathroom and stumbled while getting up from the toilet.  Patient fell directly on the posterior aspect of the right shoulder over the shoulder blade. The patient noted immediate shoulder pain and difficulty with use of the arm.   Pain is described as sharp at its worst and a dull ache at its best.  Pain is rated a 10 out of 10 in severity with attempted movement.  Pain is improved with rest and immobilization.  Pain is worse with any sort of movement and with direct pressure over the scapula.  X-rays in the emergency  department show a comminuted left scapular fracture. She has  [employer] medicare/medicaid. She understands that surgery is not required and understands she will need to remain in arm sleeve. She can partake in PT  And OT in about 2 weeks.  Employment status:  Retired Health and safety inspectornsurance information:  [place], [place] PT Recommendations:    Information / Referral to community resources:   TBD  Patient/Family's Response to care:  TBD  Patient/Family's Understanding of and Emotional Response to Diagnosis, Current Treatment, and Prognosis:  Patient has a very good understanding her pain is to be managed.  Emotional Assessment Appearance:  Appears stated age Attitude/Demeanor/Rapport:  Gracious, Charismatic Affect (typically observed):  Accepting, Adaptable Orientation:  Oriented to Self, Oriented to Place, Oriented to  Time, Oriented to Situation Alcohol / Substance use:    Psych involvement (Current and /or in the community):  No (Comment)  Discharge Needs  Concerns to be addressed:  No discharge needs identified Readmission within the last 30 days:  No Current discharge risk:  None Barriers to Discharge:  No Barriers Identified   Cheron SchaumannBandi, Wilene Pharo M, LCSW 08/23/2018, 12:33 PM

## 2018-08-23 NOTE — ED Notes (Signed)
Lab in room to draw blood

## 2018-08-23 NOTE — Consult Note (Signed)
ORTHOPAEDIC CONSULTATION  REQUESTING PHYSICIAN: Sharyn CreamerQuale, Mark, MD  Chief Complaint:   L shoulder pain  History of Present Illness: Deeann CreeGlenda C Vantrease is a 72 y.o. female who had a fall earlier today at her assisted living facility.  She was in the bathroom and stumbled while getting up from the toilet.  Patient fell directly on the posterior aspect of the right shoulder over the shoulder blade. The patient noted immediate shoulder pain and difficulty with use of the arm.   Pain is described as sharp at its worst and a dull ache at its best.  Pain is rated a 10 out of 10 in severity with attempted movement.  Pain is improved with rest and immobilization.  Pain is worse with any sort of movement and with direct pressure over the scapula.  X-rays in the emergency department show a comminuted left scapular fracture.  Past Medical History:  Diagnosis Date  . Arrhythmia    atrial fibrillation  . CHF (congestive heart failure) (HCC)   . Diabetes mellitus without complication (HCC)   . Diverticulitis   . Frequent falls   . Hypertension    Past Surgical History:  Procedure Laterality Date  . ABDOMINAL HYSTERECTOMY    . CARDIAC CATHETERIZATION Left 04/13/2016   Procedure: Left Heart Cath and Coronary Angiography;  Surgeon: Laurier NancyShaukat A Khan, MD;  Location: [facility] INVASIVE CV LAB;  Service: Cardiovascular;  Laterality: Left;  . JOINT REPLACEMENT     Social History   Socioeconomic History  . Marital status: Widowed    Spouse name: Not on file  . Number of children: Not on file  . Years of education: Not on file  . Highest education level: Not on file  Occupational History  . Occupation: retired  Engineer, productionocial Needs  . Financial resource strain: Not on file  . Food insecurity:    Worry: Not on file    Inability: Not on file  . Transportation needs:    Medical: Not on file    Non-medical: Not on file  Tobacco Use  . Smoking status: Former  Smoker    Packs/day: 0.50    Years: 15.00    Pack years: 7.50    Types: Cigarettes  . Smokeless tobacco: Never Used  Substance and Sexual Activity  . Alcohol use: No  . Drug use: No  . Sexual activity: Never  Lifestyle  . Physical activity:    Days per week: Not on file    Minutes per session: Not on file  . Stress: Not on file  Relationships  . Social connections:    Talks on phone: Not on file    Gets together: Not on file    Attends religious service: Not on file    Active member of club or organization: Not on file    Attends meetings of clubs or organizations: Not on file    Relationship status: Not on file  Other Topics Concern  . Not on file  Social History Narrative  . Not on file   Family History  Problem Relation Age of Onset  . Stomach cancer Sister 3225  . Vaginal cancer Sister 3830  . Heart disease Father   . Breast cancer Neg Hx    Allergies  Allergen Reactions  . Peanuts [Peanut Oil] Shortness Of Breath  . Lasix [Furosemide] Swelling    Tongue swelling  . Lisinopril Swelling    Tongue swelling   Prior to Admission medications   Medication Sig Start Date End Date Taking? Authorizing  Provider  Calcium Carbonate-Vitamin D3 (CALCIUM 600/VITAMIN D) 600-400 MG-UNIT TABS Take 1 tablet by mouth 2 (two) times daily.    [provider]  carbidopa-levodopa (SINEMET IR) 25-100 MG tablet Take 1 tablet by mouth 3 (three) times daily.    [provider]  cetirizine (ZYRTEC) 10 MG tablet Take 10 mg by mouth daily.     [provider]  ferrous sulfate 325 (65 FE) MG tablet Take 1 tablet (325 mg total) by mouth 2 (two) times daily with a meal. 07/28/18   Enid Baas, MD  fesoterodine (TOVIAZ) 8 MG TB24 tablet Take 8 mg by mouth daily.    [provider]  fluorometholone (FML) 0.1 % ophthalmic suspension Place 1 drop into the right eye 2 (two) times daily.    [provider]  fluticasone (FLONASE) 50 MCG/ACT nasal spray  Place 2 sprays into both nostrils daily.    [provider]  furosemide (LASIX) 40 MG tablet Take 0.5 tablets (20 mg total) by mouth daily. 07/28/18 07/28/19  Enid Baas, MD  hydrALAZINE (APRESOLINE) 25 MG tablet Take 25 mg by mouth 2 (two) times daily.    [provider]  ipratropium (ATROVENT) 0.03 % nasal spray Place 2 sprays into both nostrils 3 (three) times daily as needed for rhinitis.    [provider]  NOVOLOG FLEXPEN 100 UNIT/ML FlexPen Inject 0-10 Units into the skin 3 (three) times daily with meals. <60 Call MD 151-200 - 2 units 201-250 4 units 251-300 - 6 units 301-350 - 8 units 351-400 - 10 units >400 Call MD 05/23/18   [provider]  omeprazole (PRILOSEC) 40 MG capsule Take 1 capsule by mouth daily. 09/26/15   [provider]  potassium chloride (K-DUR,KLOR-CON) 10 MEQ tablet Take 10 mEq by mouth daily. 05/09/18   [provider]  prednisoLONE acetate (PRED FORTE) 1 % ophthalmic suspension Place 1 drop into both eyes 2 (two) times daily.     [provider]  rivaroxaban (XARELTO) 20 MG TABS tablet Take 1 tablet (20 mg total) by mouth daily. 07/28/18   Enid Baas, MD  rosuvastatin (CRESTOR) 20 MG tablet Take 20 mg by mouth daily.    [provider]  sertraline (ZOLOFT) 100 MG tablet Take 100 mg by mouth daily.    [provider]  sotalol (BETAPACE) 80 MG tablet Take 80 mg by mouth 2 (two) times daily.    [provider]  spironolactone (ALDACTONE) 25 MG tablet Take 25 mg by mouth daily.     [provider]  traMADol (ULTRAM) 50 MG tablet Take 1 tablet (50 mg total) by mouth every 6 (six) hours as needed. 07/28/18 07/28/19  Enid Baas, MD  valACYclovir (VALTREX) 1000 MG tablet Take 1,000 mg by mouth daily.    [provider]   Recent Labs    08/23/18 1045  WBC 5.7  HGB 11.6*  HCT 36.4  PLT 243   Dg Chest 2 View  Result Date: 08/23/2018 CLINICAL  DATA:  Left shoulder pain secondary to a fall in the bathroom this morning. EXAM: CHEST - 2 VIEW COMPARISON:  Chest x-rays dated 05/31/2018 and 01/11/2018 FINDINGS: There is a comminuted fracture of the left scapula. Heart size is at the upper limits of normal considering the AP technique. Aortic atherosclerosis. Slight pulmonary vascular congestion, new since the prior study. Tiny bilateral effusions. Chronic accentuation of the thoracic kyphosis. IMPRESSION: 1. Comminuted fracture of the left scapula. 2. New pulmonary vascular congestion with  tiny bilateral pleural effusions. 3.  Aortic Atherosclerosis (ICD10-I70.0). Electronically Signed   By: Francene Boyers M.D.   On: 08/23/2018 10:24   Ct Head Wo Contrast  Result Date: 08/23/2018 CLINICAL DATA:  Fall, on anticoagulation EXAM: CT HEAD WITHOUT CONTRAST TECHNIQUE: Contiguous axial images were obtained from the base of the skull through the vertex without intravenous contrast. COMPARISON:  None. FINDINGS: Brain: No evidence of acute infarction, hemorrhage, hydrocephalus, extra-axial collection or mass lesion/mass effect. Subcortical white matter and periventricular small vessel ischemic changes. Vascular: Intracranial atherosclerosis. Skull: Normal. Negative for fracture or focal lesion. Sinuses/Orbits: The visualized paranasal sinuses are essentially clear. The mastoid air cells are unopacified. Other: None. IMPRESSION: No evidence of acute intracranial abnormality. Small vessel ischemic changes. Electronically Signed   By: Charline Bills M.D.   On: 08/23/2018 10:30   Ct Shoulder Left Wo Contrast  Result Date: 08/23/2018 CLINICAL DATA:  Fall in bathroom this morning.  Scapular fractures. EXAM: CT OF THE UPPER LEFT EXTREMITY WITHOUT CONTRAST TECHNIQUE: Multidetector CT imaging of the left shoulder and scapula was performed according to the standard protocol. COMPARISON:  Radiographs same date. FINDINGS: Bones/Joint/Cartilage There is an extensively  comminuted and displaced fracture of the left scapular body. There is no involvement of the glenoid, coracoid process or acromion. The humeral head is located and intact. The left clavicle is intact. There are mild acromioclavicular degenerative changes. No rib fractures are seen. Ligaments Suboptimally assessed by CT. Muscles and Tendons There is only mild soft tissue swelling in the subscapularis and teres minor tendons. No focal hematoma. The subacromial space of the left shoulder is mildly narrowed without gross rotator cuff tear. Soft tissues No focal hematoma or significant joint effusion. There is aortic and branch vessel atherosclerosis. IMPRESSION: 1. Extensively comminuted and displaced extra-articular fracture of the left scapular body. No involvement of the glenoid, acromion or coracoid process. 2. Located and intact proximal left humerus. 3. No significant periarticular hematoma demonstrated. Electronically Signed   By: Carey Bullocks M.D.   On: 08/23/2018 12:01   Dg Humerus Left  Result Date: 08/23/2018 CLINICAL DATA:  Left shoulder pain secondary to a fall in the bathroom this morning. EXAM: LEFT HUMERUS - 2+ VIEW COMPARISON:  None. FINDINGS: There are comminuted angulated displaced fractures of the scapula. Glenohumeral joint is intact. No dislocation. Humerus is intact. Scalloping of the undersurface of the acromion is suggestive of rotator cuff disease. IMPRESSION: Extensive left scapular fractures as described. The humerus is intact and not dislocated. Electronically Signed   By: Francene Boyers M.D.   On: 08/23/2018 10:22     Positive ROS: All other systems have been reviewed and were otherwise negative with the exception of those mentioned in the HPI and as above.  Physical Exam: BP (!) 167/78   Pulse 70   Temp 98 F (36.7 C) (Oral)   Resp (!) 21   Ht 5\' 9"  (1.753 m)   Wt 89.8 kg   SpO2 99%   BMI 29.24 kg/m  General:  Alert, no acute distress Psychiatric:  Patient is  competent for consent with normal mood and affect   Cardiovascular:  No pedal edema, regular rate and rhythm Respiratory:  No wheezing, non-labored breathing GI:  Abdomen is soft and non-tender Skin:  No lesions in the area of chief complaint, no erythema Neurologic:  Sensation intact distally, CN grossly intact Lymphatic:  No axillary or cervical lymphadenopathy  Orthopedic Exam:  LUE: +ain/pin/u motor SILT r/u/m/ax +rad pulse RoM   X-rays/CT  scan:  As above: L comminuted scapula fracture.  There is no involvement of the glenohumeral joint as the glenoid itself is intact.  Additionally, there is no fracture involving the clavicle or coracoid.  The main fracture line involving the scapular neck is not significantly angulated.  Assessment/Plan: JUDIA ARNOTT is a 72 y.o. female with a L comminuted scapular fracture without involvement of the joint or other associated structures about the scapula.  1.  We discussed both operative and nonoperative management.  Given the lack of involvement of the glenohumeral joint and the relatively minimal amount of angulation/displacement, we agreed to proceed with nonsurgical management.  2.  Recommended use of a sling for immobilization.  Nonweightbearing on left upper extremity.  3.  Patient can follow-up in approximately 2 weeks with repeat x-rays     Signa Kell   08/23/2018 12:23 PM

## 2018-08-23 NOTE — ED Triage Notes (Signed)
Pt arrived via ems from [facility] assisted living - pt was in the bathroom and "stumbled" when getting up from toilet - pt feel on right side but hit left arm on toilet seat - pt c/o left shoulder and arm pain

## 2018-08-23 NOTE — ED Notes (Signed)
Lab called for recollect on blood

## 2018-08-23 NOTE — ED Notes (Signed)
EMS states that pt is 2nd in line for pick up.

## 2018-08-23 NOTE — ED Notes (Signed)
Patient transported to CT 

## 2018-08-23 NOTE — ED Notes (Signed)
Lab called for recollect of blood - advised lab that first sample was pulled from line and second sample was from a straight stick - advised lab that they would need to come and collect the blood

## 2018-08-23 NOTE — ED Notes (Signed)
This RN spoke with P.T. About pt order and eval prior to d/c

## 2018-08-23 NOTE — ED Notes (Signed)
Sling immobilizer applied to left arm.

## 2018-08-23 NOTE — Discharge Instructions (Signed)
Use sling for comfort while awake. No weight bearing left upper arm.  Follow-up with orthopedics (Dr. Allena KatzPatel) in 1-2 weeks.

## 2018-10-27 ENCOUNTER — Ambulatory Visit
Admission: RE | Admit: 2018-10-27 | Discharge: 2018-10-27 | Disposition: A | Discharge status: Home / Self Care | Payer: Medicare HMO | Source: Ambulatory Visit | Attending: Internal Medicine | Admitting: Internal Medicine

## 2018-10-27 DIAGNOSIS — Z1231 Encounter for screening mammogram for malignant neoplasm of breast: Secondary | ICD-10-CM | POA: Diagnosis not present

## 2018-11-06 ENCOUNTER — Other Ambulatory Visit: Payer: Self-pay

## 2018-11-06 ENCOUNTER — Emergency Department: Payer: Medicare HMO

## 2018-11-06 ENCOUNTER — Emergency Department
Admission: EM | Admit: 2018-11-06 | Discharge: 2018-11-06 | Disposition: A | Discharge status: Skilled Nursing Facility | Payer: Medicare HMO | Attending: Emergency Medicine | Admitting: Emergency Medicine

## 2018-11-06 DIAGNOSIS — I5032 Chronic diastolic (congestive) heart failure: Secondary | ICD-10-CM | POA: Diagnosis not present

## 2018-11-06 DIAGNOSIS — I110 Hypertensive heart disease with heart failure: Secondary | ICD-10-CM | POA: Diagnosis not present

## 2018-11-06 DIAGNOSIS — K5900 Constipation, unspecified: Secondary | ICD-10-CM | POA: Diagnosis not present

## 2018-11-06 DIAGNOSIS — Z87891 Personal history of nicotine dependence: Secondary | ICD-10-CM | POA: Insufficient documentation

## 2018-11-06 DIAGNOSIS — Z7901 Long term (current) use of anticoagulants: Secondary | ICD-10-CM | POA: Insufficient documentation

## 2018-11-06 DIAGNOSIS — R109 Unspecified abdominal pain: Secondary | ICD-10-CM | POA: Diagnosis present

## 2018-11-06 DIAGNOSIS — E119 Type 2 diabetes mellitus without complications: Secondary | ICD-10-CM | POA: Insufficient documentation

## 2018-11-06 DIAGNOSIS — Z79899 Other long term (current) drug therapy: Secondary | ICD-10-CM | POA: Diagnosis not present

## 2018-11-06 DIAGNOSIS — R1033 Periumbilical pain: Secondary | ICD-10-CM | POA: Diagnosis not present

## 2018-11-06 DIAGNOSIS — Z794 Long term (current) use of insulin: Secondary | ICD-10-CM | POA: Insufficient documentation

## 2018-11-06 LAB — CBC WITH DIFFERENTIAL/PLATELET
Abs Immature Granulocytes: 0.02 10*3/uL (ref 0.00–0.07)
Basophils Absolute: 0 10*3/uL (ref 0.0–0.1)
Basophils Relative: 1 %
EOS ABS: 0.1 10*3/uL (ref 0.0–0.5)
EOS PCT: 1 %
HCT: 40 % (ref 36.0–46.0)
Hemoglobin: 12.2 g/dL (ref 12.0–15.0)
Immature Granulocytes: 0 %
Lymphocytes Relative: 17 %
Lymphs Abs: 1 10*3/uL (ref 0.7–4.0)
MCH: 28.8 pg (ref 26.0–34.0)
MCHC: 30.5 g/dL (ref 30.0–36.0)
MCV: 94.3 fL (ref 80.0–100.0)
MONO ABS: 0.6 10*3/uL (ref 0.1–1.0)
Monocytes Relative: 10 %
Neutro Abs: 4.1 10*3/uL (ref 1.7–7.7)
Neutrophils Relative %: 71 %
Platelets: 201 10*3/uL (ref 150–400)
RBC: 4.24 MIL/uL (ref 3.87–5.11)
RDW: 13 % (ref 11.5–15.5)
WBC: 5.7 10*3/uL (ref 4.0–10.5)
nRBC: 0 % (ref 0.0–0.2)

## 2018-11-06 LAB — URINALYSIS, COMPLETE (UACMP) WITH MICROSCOPIC
BACTERIA UA: NONE SEEN
Bilirubin Urine: NEGATIVE
GLUCOSE, UA: NEGATIVE mg/dL
Hgb urine dipstick: NEGATIVE
Ketones, ur: NEGATIVE mg/dL
Nitrite: NEGATIVE
PROTEIN: NEGATIVE mg/dL
Specific Gravity, Urine: 1.018 (ref 1.005–1.030)
pH: 6 (ref 5.0–8.0)

## 2018-11-06 LAB — CG4 I-STAT (LACTIC ACID): LACTIC ACID, VENOUS: 0.56 mmol/L (ref 0.5–1.9)

## 2018-11-06 LAB — COMPREHENSIVE METABOLIC PANEL
ALK PHOS: 71 U/L (ref 38–126)
ALT: 11 U/L (ref 0–44)
AST: 15 U/L (ref 15–41)
Albumin: 3.6 g/dL (ref 3.5–5.0)
Anion gap: 8 (ref 5–15)
BILIRUBIN TOTAL: 0.6 mg/dL (ref 0.3–1.2)
BUN: 12 mg/dL (ref 8–23)
CALCIUM: 9 mg/dL (ref 8.9–10.3)
CO2: 31 mmol/L (ref 22–32)
CREATININE: 0.87 mg/dL (ref 0.44–1.00)
Chloride: 103 mmol/L (ref 98–111)
GFR calc non Af Amer: >60 mL/min (ref >60)
Glucose, Bld: 104 mg/dL — ABNORMAL HIGH (ref 70–99)
Potassium: 3.7 mmol/L (ref 3.5–5.1)
Sodium: 142 mmol/L (ref 135–145)
TOTAL PROTEIN: 6.9 g/dL (ref 6.5–8.1)

## 2018-11-06 LAB — POCT I-STAT, CHEM 8
BUN: 11 mg/dL (ref 8–23)
Calcium, Ion: 1.21 mmol/L (ref 1.15–1.40)
Chloride: 102 mmol/L (ref 98–111)
Creatinine, Ser: 0.9 mg/dL (ref 0.44–1.00)
Glucose, Bld: 99 mg/dL (ref 70–99)
HEMATOCRIT: 41 % (ref 36.0–46.0)
Hemoglobin: 13.9 g/dL (ref 12.0–15.0)
Potassium: 3.7 mmol/L (ref 3.5–5.1)
SODIUM: 140 mmol/L (ref 135–145)
TCO2: 30 mmol/L (ref 22–32)

## 2018-11-06 LAB — INFLUENZA PANEL BY PCR (TYPE A & B)
INFLBPCR: NEGATIVE
Influenza A By PCR: NEGATIVE

## 2018-11-06 LAB — PROCALCITONIN

## 2018-11-06 MED ORDER — SODIUM CHLORIDE 0.9 % IV SOLN
1.0000 g | Freq: Once | INTRAVENOUS | Status: AC
  Administered 2018-11-06: 1 g via INTRAVENOUS
  Filled 2018-11-06: qty 10

## 2018-11-06 MED ORDER — ACETAMINOPHEN 500 MG PO TABS
ORAL_TABLET | ORAL | Status: AC
  Filled 2018-11-06: qty 2

## 2018-11-06 MED ORDER — IOHEXOL 300 MG/ML  SOLN
100.0000 mL | Freq: Once | INTRAMUSCULAR | Status: AC | PRN
  Administered 2018-11-06: 100 mL via INTRAVENOUS

## 2018-11-06 MED ORDER — ACETAMINOPHEN 500 MG PO TABS
1000.0000 mg | ORAL_TABLET | Freq: Once | ORAL | Status: AC
  Administered 2018-11-06: 1000 mg via ORAL

## 2018-11-06 MED ORDER — ACETAMINOPHEN 10 MG/ML IV SOLN
1000.0000 mg | Freq: Once | INTRAVENOUS | Status: DC
  Filled 2018-11-06: qty 100

## 2018-11-06 MED ORDER — SODIUM CHLORIDE 0.9 % IV BOLUS
1000.0000 mL | Freq: Once | INTRAVENOUS | Status: AC
  Administered 2018-11-06: 1000 mL via INTRAVENOUS

## 2018-11-06 NOTE — ED Provider Notes (Signed)
[facility] Emergency Department Provider Note  ____________________________________________   First MD Initiated Contact with Patient 11/06/18 [phone]     (approximate)  I have reviewed the triage vital signs and the nursing notes.   HISTORY  Chief Complaint Abdominal Pain   HPI Virginia Barnes is a 72 y.o. female who comes to the emergency department via EMS with 2 days of abdominal pain and for days of constipation.  She resides in a nursing home and her pain began about 48 hours ago.  It was initially diffuse throughout her abdomen and then migrated to her right lower quadrant.  She says she has not had a bowel movement since Monday roughly 4 days ago.  She does have a remote history of abdominal hysterectomy although no other abdominal surgeries.  EMS noted she felt warm and was sweating however they had a normal temperature in route.  The patient denies cough or chest pain.  Her symptoms have been insidious onset slowly progressive are now moderate severity and nonradiating and nothing seems to make them better or worse.   Past Medical History:  Diagnosis Date  . Arrhythmia    atrial fibrillation  . CHF (congestive heart failure) (HCC)   . Diabetes mellitus without complication (HCC)   . Diverticulitis   . Frequent falls   . Hypertension     Patient Active Problem List   Diagnosis Date Noted  . Hematoma 07/25/2018  . 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 Chronic diastolic heart failure (HCC) 02/17/2018  . HTN (hypertension) 02/17/2018  . PAF (paroxysmal atrial fibrillation) (HCC) 02/17/2018  . Lymphedema 02/17/2018  . Diabetes (HCC) 02/17/2018  . Sepsis (HCC) 01/30/2018  . Hypoglycemia 01/12/2018  . Knee pain 05/17/2017    Past Surgical History:  Procedure Laterality Date  . ABDOMINAL HYSTERECTOMY    . CARDIAC CATHETERIZATION Left 04/13/2016   Procedure: Left Heart Cath and Coronary Angiography;  Surgeon: Laurier Nancy, MD;  Location:  [facility] INVASIVE CV LAB;  Service: Cardiovascular;  Laterality: Left;  . JOINT REPLACEMENT      Prior to Admission medications   Medication Sig Start Date End Date Taking? Authorizing Provider  Calcium Carbonate-Vitamin D3 (CALCIUM 600/VITAMIN D) 600-400 MG-UNIT TABS Take 1 tablet by mouth 2 (two) times daily.   Yes [provider]  cetirizine (ZYRTEC) 10 MG tablet Take 10 mg by mouth daily.    Yes [provider]  ferrous sulfate 325 (65 FE) MG tablet Take 1 tablet (325 mg total) by mouth 2 (two) times daily with a meal. 07/28/18  Yes Enid Baas, MD  fesoterodine (TOVIAZ) 8 MG TB24 tablet Take 8 mg by mouth daily.   Yes [provider]  fluorometholone (FML) 0.1 % ophthalmic suspension Place 1 drop into the right eye 2 (two) times daily.   Yes [provider]  fluticasone (FLONASE) 50 MCG/ACT nasal spray Place 2 sprays into both nostrils daily.   Yes [provider]  furosemide (LASIX) 40 MG tablet Take 0.5 tablets (20 mg total) by mouth daily. 07/28/18 07/28/19 Yes Enid Baas, MD  hydrALAZINE (APRESOLINE) 25 MG tablet Take 25 mg by mouth 2 (two) times daily.   Yes [provider]  ipratropium (ATROVENT) 0.03 % nasal spray Place 2 sprays into both nostrils 3 (three) times daily as needed for rhinitis.   Yes [provider]  NOVOLOG FLEXPEN 100 UNIT/ML FlexPen Inject 0-10 Units into the skin 3 (three) times daily with meals. <60 Call MD 151-200 -  2 units 201-250 4 units 251-300 - 6 units 301-350 - 8 units 351-400 - 10 units >400 Call MD 05/23/18  Yes [provider]  omeprazole (PRILOSEC) 40 MG capsule Take 1 capsule by mouth daily. 09/26/15  Yes [provider]  potassium chloride (K-DUR,KLOR-CON) 10 MEQ tablet Take 10 mEq by mouth daily. 05/09/18  Yes [provider]  prednisoLONE acetate (PRED FORTE) 1 % ophthalmic suspension Place 1 drop into both eyes 2 (two) times daily.    Yes [provider]  rivaroxaban (XARELTO) 20 MG TABS tablet Take 1 tablet (20 mg total) by mouth daily. 07/28/18  Yes Enid Baas, MD  rosuvastatin (CRESTOR) 20 MG tablet Take 20 mg by mouth every evening.    Yes [provider]  sertraline (ZOLOFT) 100 MG tablet Take 100 mg by mouth daily.   Yes [provider]  sotalol (BETAPACE) 80 MG tablet Take 80 mg by mouth 2 (two) times daily.   Yes [provider]  traMADol (ULTRAM) 50 MG tablet Take 1 tablet (50 mg total) by mouth every 6 (six) hours as needed. 07/28/18 07/28/19 Yes Enid Baas, MD  valACYclovir (VALTREX) 1000 MG tablet Take 1,000 mg by mouth daily.   Yes [provider]  HYDROcodone-acetaminophen (NORCO/VICODIN) 5-325 MG tablet Take 1 tablet by mouth every 6 (six) hours as needed for severe pain. Patient not taking: Reported on 11/06/2018 08/23/18   Sharyn Creamer, MD    Allergies Peanuts [peanut oil]; Lasix [furosemide]; and Lisinopril  Family History  Problem Relation Age of Onset  . Stomach cancer Sister 42  . Vaginal cancer Sister 54  . Heart disease Father   . Breast cancer Neg Hx     Social History Social History   Tobacco Use  . Smoking status: Former Smoker    Packs/day: 0.50    Years: 15.00    Pack years: 7.50    Types: Cigarettes  . Smokeless tobacco: Never Used  Substance Use Topics  . Alcohol use: No  . Drug use: No    Review of Systems Constitutional: Positive for fevers Eyes: No visual changes. ENT: No sore throat. Cardiovascular: Denies chest pain. Respiratory: Denies shortness of breath. Gastrointestinal: Positive for abdominal pain.  Positive for nausea, no vomiting.  No diarrhea.  No constipation. Genitourinary: Negative for dysuria. Musculoskeletal: Negative for back pain. Skin: Negative for rash. Neurological: Negative for headaches, focal weakness or numbness.   ____________________________________________   PHYSICAL EXAM:  VITAL SIGNS: ED  Triage Vitals  Enc Vitals Group     BP      Pulse      Resp      Temp      Temp src      SpO2      Weight      Height      Head Circumference      Peak Flow      Pain Score      Pain Loc      Pain Edu?      Excl. in GC?     Constitutional: Pleasant cooperative speaks in full clear sentences no diaphoresis Eyes: PERRL EOMI. Head: Atraumatic. Nose: No congestion/rhinnorhea. Mouth/Throat: No trismus Neck: No stridor.   Cardiovascular: Normal rate, regular rhythm. Grossly normal heart sounds.  Good peripheral circulation. Respiratory: Normal respiratory effort.  No retractions. Lungs CTAB and moving good air Gastrointestinal: Abdomen is soft somewhat tender in the right lower quadrant actually a little bit lower than McBurney's point with  no rebound no guarding no peritonitis negative Rovsing's no costovertebral tenderness Musculoskeletal: No lower extremity edema   Neurologic:  Normal speech and language. No gross focal neurologic deficits are appreciated. Skin: Again is quite warm to the touch.  Not diaphoretic Psychiatric: Mood and affect are normal. Speech and behavior are normal.    ____________________________________________   DIFFERENTIAL includes but not limited to  Appendicitis, bowel obstruction, urinary tract infection, pyelonephritis, influenza, volvulus ____________________________________________   LABS (all labs ordered are listed, but only abnormal results are displayed)  Labs Reviewed  COMPREHENSIVE METABOLIC PANEL - Abnormal; Notable for the following components:      Result Value   Glucose, Bld 104 (*)    All other components within normal limits  CULTURE, BLOOD (ROUTINE X 2)  CULTURE, BLOOD (ROUTINE X 2)  CBC WITH DIFFERENTIAL/PLATELET  URINALYSIS, COMPLETE (UACMP) WITH MICROSCOPIC  PROCALCITONIN  INFLUENZA PANEL BY PCR (TYPE A & B)  I-STAT CHEM 8, ED  CG4 I-STAT (LACTIC ACID)  POCT I-STAT, CHEM 8    Lab work is largely pending at this time  however chemistry and lactic acid as well as CBC are unremarkable __________________________________________  EKG  ED ECG REPORT I, Merrily BrittleNeil Huldah Marin, the attending physician, personally viewed and interpreted this ECG.  Date: 11/06/2018 EKG Time:  Rate: 70 Rhythm: normal sinus rhythm QRS Axis: normal Intervals: normal ST/T Wave abnormalities: normal Narrative Interpretation: no evidence of acute ischemia    ____________________________________________  RADIOLOGY  CT abdomen pelvis with IV contrast is pending at this time ____________________________________________   PROCEDURES  Procedure(s) performed: no  Procedures  Critical Care performed: no  ____________________________________________   INITIAL IMPRESSION / ASSESSMENT AND PLAN / ED COURSE  Pertinent labs & imaging results that were available during my care of the patient were reviewed by me and considered in my medical decision making (see chart for details).   As part of my medical decision making, I reviewed the following data within the electronic MEDICAL RECORD NUMBER History obtained from family if available, nursing notes, old chart and ekg, as well as notes from prior ED visits.  Patient comes to the emergency department with worsening right lower quadrant abdominal pain.  She also has constipation.  She felt warm to the touch so I checked a rectal temperature that was 100.0 degrees raising concern for possible sepsis.  I will check blood cultures and a lactic acid and give a dose of ceftriaxone.  In and out urinalysis is pending and the patient will require a CT scan of her abdomen pelvis with IV contrast.  Oral Tylenol for pain and fever now.  Care transition to Dr. Shaune PollackLord who will follow up on the results of the CT scan and determine ultimate disposition.      ____________________________________________   FINAL CLINICAL IMPRESSION(S) / ED DIAGNOSES  Final diagnoses:  Sepsis, due to unspecified organism,  unspecified whether acute organ dysfunction present [facility](HCC)      NEW MEDICATIONS STARTED DURING THIS VISIT:  New Prescriptions   No medications on file     Note:  This document was prepared using Dragon voice recognition software and may include unintentional dictation errors.     Merrily Brittleifenbark, Avner Stroder, MD 11/06/18 [phone]

## 2018-11-06 NOTE — ED Notes (Signed)
Report given to brandy at [facility] at this time. [facility] arrived to transport.

## 2018-11-06 NOTE — Discharge Instructions (Signed)
You are evaluated for abdominal pain, and although no certain cause was found, your exam and evaluation are overall reassuring in the emergency department today.  You did have a low-grade temperature here, uncertain cause, but again your exam and evaluation are overall reassuring.  Return to the emergency department immediately for any worsening condition including new or worsening or uncontrolled abdominal pain, inability to urinate, blood in the urine, trouble breathing, chest pain, vomiting blood, black or bloody stool, altered mental status, or any other symptoms concerning to you.

## 2018-11-06 NOTE — ED Notes (Signed)
Lab in room collecting second culture at this time

## 2018-11-06 NOTE — ED Notes (Signed)
Attempted to contact [facility] to provide discharge report to facility. No answer, unable to provide report.

## 2018-11-06 NOTE — ED Notes (Signed)
Pt in with co RLQ pain that started yesterday denies any fever, no n.v.d or dysuria. Pt denies a fever, states no BM since Monday, states hx of the same. Pt states abd is nontender, but hurts worse when she walks.

## 2018-11-06 NOTE — ED Triage Notes (Signed)
Pt brought in by [facility] from [facility] for RLQ pain that started yesterday. Pt denies any dysuria, denies any n.v.d.

## 2018-11-06 NOTE — ED Provider Notes (Signed)
[facility]  I accepted care from Dr. Lamont Snowball ____________________________________________    LABS (pertinent positives/negatives)  I, Governor Rooks, MD have personally reviewed the lab reports noted below.  Labs Reviewed  COMPREHENSIVE METABOLIC PANEL - Abnormal; Notable for the following components:      Result Value   Glucose, Bld 104 (*)    All other components within normal limits  URINALYSIS, COMPLETE (UACMP) WITH MICROSCOPIC - Abnormal; Notable for the following components:   Color, Urine YELLOW (*)    APPearance CLOUDY (*)    Leukocytes, UA TRACE (*)    All other components within normal limits  CULTURE, BLOOD (ROUTINE X 2)  CULTURE, BLOOD (ROUTINE X 2)  URINE CULTURE  CBC WITH DIFFERENTIAL/PLATELET  PROCALCITONIN  INFLUENZA PANEL BY PCR (TYPE A & B)  CG4 I-STAT (LACTIC ACID)  POCT I-STAT, CHEM 8     ____________________________________________    RADIOLOGY All xrays were viewed by me. Imaging interpreted by radiologist.  I, Governor Rooks MD have personally reviewed the imaging report noted below.  Ct abd pel:  IMPRESSION: 1. No acute abnormality identified in the abdomen or pelvis. 2. Decreased size of subcutaneous hematoma in the lower back. 3. New small bilateral pleural effusions. 4. Moderate-sized hiatal hernia. 5. Nonobstructing right nephrolithiasis. Increased size of right renal cyst. 6. Aortic Atherosclerosis (ICD10-I70.0).  ____________________________________________   PROCEDURES  Procedure(s) performed: None  Procedures  Critical Care performed: None  ____________________________________________   INITIAL IMPRESSION / ASSESSMENT AND PLAN / ED COURSE   Pertinent labs & imaging results that were available during my care of the patient were reviewed by me and considered in my medical decision making (see chart for details).   Patient CARE accepted at shift change.  Initial concern was for possibility of sepsis  or intra-abdominal emergency.  Lactate is normal.  Patient for mesenteric ischemia is low.  Urinalysis with some white blood cells, but not clear for urinary tract infection.  Culture will be sent.  White blood cell count is normal with no left shift.  Patient had a low-grade temperature, flu was sent and this was returned negative.  Patient is not having a predatory symptoms.  Metabolic panel was reassuring.  CT scan result was returned negative for any acute finding in the abdomen pelvis to account for abdominal pain.  At this point I did go into reexamine the patient, and she states that she does have some mild pain that is around the mid abdomen/umbilicus and its worse when she attempts to sit up.  Sound like this may actually be musculoskeletal.  She states that she was inadvertently kneed in the abdomen a couple of days ago by a CNA at her facility, and she thinks that what she is hurting from.  We also discussed that symptoms of abdominal pain could be related to possible constipation.  Patient states that she does occasionally have hard stools.  In any case, we reviewed her reassuring evaluation.  I think it is reasonable for her to be discharged back to her facility.  I do not have a high suspicion for true infection or sepsis.  Blood cultures were sent previously.    CONSULTATIONS: None    Patient / Family / Caregiver informed of clinical course, medical decision-making process, and agree with plan.   I discussed return precautions, follow-up instructions, and discharged instructions with patient and/or family.   Discharge instructions:  You are evaluated for abdominal pain, and although no certain cause was found, your exam  and evaluation are overall reassuring in the emergency department today.  You did have a low-grade temperature here, uncertain cause, but again your exam and evaluation are overall reassuring.  Return to the emergency department immediately for any  worsening condition including new or worsening or uncontrolled abdominal pain, inability to urinate, blood in the urine, trouble breathing, chest pain, vomiting blood, black or bloody stool, altered mental status, or any other symptoms concerning to you.   ____________________________________________   FINAL CLINICAL IMPRESSION(S) / ED DIAGNOSES  Final diagnoses:  Periumbilical abdominal pain        Governor RooksLord, Aliceson Dolbow, MD 11/06/18 [phone]

## 2018-11-07 LAB — URINE CULTURE: Culture: NO GROWTH

## 2018-11-11 LAB — CULTURE, BLOOD (ROUTINE X 2)
Culture: NO GROWTH
Culture: NO GROWTH
SPECIAL REQUESTS: ADEQUATE
SPECIAL REQUESTS: ADEQUATE

## 2019-04-17 ENCOUNTER — Telehealth: Payer: Self-pay | Admitting: Surgery

## 2019-04-17 NOTE — Telephone Encounter (Signed)
Left a message for the patient to call the office. °

## 2019-04-17 NOTE — Telephone Encounter (Signed)
-----   Message from Ancil Linsey, MD sent at 04/17/2019 12:21 PM EDT ----- Regarding: Referral I can see next Thursday. Thank you.           Barbara Cower  ----- Message ----- From: Durene Fruits Sent: 04/17/2019  12:15 PM EDT To: Ancil Linsey, MD  We have received a referral on this patient for a lipoma on her back she is being referred by Smiley Houseman FNP. When would you be able to see the patient office notes are here in the office.

## 2019-04-21 NOTE — Telephone Encounter (Signed)
Left another message for the patient to call the office. °

## 2019-05-07 ENCOUNTER — Ambulatory Visit: Payer: Medicare Other | Admitting: Surgery

## 2019-05-21 ENCOUNTER — Ambulatory Visit: Payer: Medicare Other | Admitting: Surgery

## 2019-05-28 ENCOUNTER — Ambulatory Visit (INDEPENDENT_AMBULATORY_CARE_PROVIDER_SITE_OTHER): Payer: Medicare Other | Admitting: Surgery

## 2019-05-28 ENCOUNTER — Encounter: Payer: Self-pay | Admitting: Surgery

## 2019-05-28 ENCOUNTER — Other Ambulatory Visit: Payer: Self-pay

## 2019-05-28 VITALS — BP 173/84 | HR 68 | Temp 97.5°F | Resp 16 | Ht 69.0 in | Wt 210.0 lb

## 2019-05-28 DIAGNOSIS — T148XXA Other injury of unspecified body region, initial encounter: Secondary | ICD-10-CM

## 2019-05-28 NOTE — Progress Notes (Signed)
[facility]urgical Clinic History and Physical  Referring provider:  Perrin Maltese, MD [place],  [place] [id number]  HISTORY OF PRESENT ILLNESS (HPI):  73 y.o. female presents for evaluation of her lower back subcutaneous mass. Patient reports she fell from standing (one of many frequent falls) 4 months ago, while on therapeutic anticoagulation for chronic atrial fibrillation, at which time she developed a large mid-lower back mass and bruising, both of which she says staff at her assisted living facility have improved significantly with the mass having become much smaller but more firm. Over the past month, she says she feels a little lower back discomfort when she sits up after bending forward from her chair. She says that she described this to her physician who comes to see her, and he referred her accordingly. She denies having noticed any similar lower back mass prior to her fall 4 months ago and likewise denies any fever/chills, lower back redness or drainage, CP, or SOB.  PAST MEDICAL HISTORY (PMH):  Past Medical History:  Diagnosis Date  . Arrhythmia    atrial fibrillation  . CHF (congestive heart failure) ([place])   . Diabetes mellitus without complication ([place])   . Diverticulitis   . Frequent falls   . Hypertension     PAST SURGICAL HISTORY ([place]):  Past Surgical History:  Procedure Laterality Date  . ABDOMINAL HYSTERECTOMY    . CARDIAC CATHETERIZATION Left 04/13/2016   Procedure: Left Heart Cath and Coronary Angiography;  Surgeon: Dionisio David, MD;  Location: [facility];  Service: Cardiovascular;  Laterality: Left;  . JOINT REPLACEMENT      MEDICATIONS:  Prior to Admission medications   Medication Sig Start Date End Date Taking? Authorizing Provider  Calcium Carbonate-Vitamin D3 (CALCIUM 600/VITAMIN D) 600-400 MG-UNIT TABS Take 1 tablet by mouth 2 (two) times daily.   Yes [provider]  cetirizine (ZYRTEC) 10 MG tablet Take 10 mg by mouth daily.     Yes [provider]  ferrous sulfate 325 (65 FE) MG tablet Take 1 tablet (325 mg total) by mouth 2 (two) times daily with a meal. 07/28/18  Yes Gladstone Lighter, MD  fesoterodine (TOVIAZ) 8 MG TB24 tablet Take 8 mg by mouth daily.   Yes [provider]  fluorometholone (FML) 0.1 % ophthalmic suspension Place 1 drop into the right eye 2 (two) times daily.   Yes [provider]  fluticasone (FLONASE) 50 MCG/ACT nasal spray Place 2 sprays into both nostrils daily.   Yes [provider]  furosemide (LASIX) 40 MG tablet Take 0.5 tablets (20 mg total) by mouth daily. 07/28/18 07/28/19 Yes Gladstone Lighter, MD  hydrALAZINE (APRESOLINE) 25 MG tablet Take 25 mg by mouth 2 (two) times daily.   Yes [provider]  HYDROcodone-acetaminophen (NORCO/VICODIN) 5-325 MG tablet Take 1 tablet by mouth every 6 (six) hours as needed for severe pain. 08/23/18  Yes Delman Kitten, MD  ipratropium (ATROVENT) 0.03 % nasal spray Place 2 sprays into both nostrils 3 (three) times daily as needed for rhinitis.   Yes [provider]  NOVOLOG FLEXPEN 100 UNIT/ML FlexPen Inject 0-10 Units into the skin 3 (three) times daily with meals. <60 Call MD 151-200 - 2 units 201-250 4 units 251-300 - 6 units 301-350 - 8 units 351-400 - 10 units >400 Call MD 05/23/18  Yes [provider]  omeprazole (PRILOSEC) 40 MG capsule Take 1 capsule by mouth daily. 09/26/15  Yes [provider]  potassium chloride (K-DUR,KLOR-CON)  10 MEQ tablet Take 10 mEq by mouth daily. 05/09/18  Yes [provider]  prednisoLONE acetate (PRED FORTE) 1 % ophthalmic suspension Place 1 drop into both eyes 2 (two) times daily.    Yes [provider]  rivaroxaban (XARELTO) 20 MG TABS tablet Take 1 tablet (20 mg total) by mouth daily. 07/28/18  Yes Enid BaasKalisetti, Radhika, MD  rosuvastatin (CRESTOR) 20 MG tablet Take 20 mg by mouth every evening.    Yes [provider]  sertraline  (ZOLOFT) 100 MG tablet Take 100 mg by mouth daily.   Yes [provider]  sotalol (BETAPACE) 80 MG tablet Take 80 mg by mouth 2 (two) times daily.   Yes [provider]  traMADol (ULTRAM) 50 MG tablet Take 1 tablet (50 mg total) by mouth every 6 (six) hours as needed. 07/28/18 07/28/19 Yes Enid BaasKalisetti, Radhika, MD  valACYclovir (VALTREX) 1000 MG tablet Take 1,000 mg by mouth daily.   Yes [provider]    ALLERGIES:  Allergies  Allergen Reactions  . Peanuts [Peanut Oil] Shortness Of Breath  . Lasix [Furosemide] Swelling    Tongue swelling  . Lisinopril Swelling    Tongue swelling    SOCIAL HISTORY:  Social History   Socioeconomic History  . Marital status: Widowed    Spouse name: Not on file  . Number of children: Not on file  . Years of education: Not on file  . Highest education level: Not on file  Occupational History  . Occupation: retired  Engineer, productionocial Needs  . Financial resource strain: Not on file  . Food insecurity    Worry: Not on file    Inability: Not on file  . Transportation needs    Medical: Not on file    Non-medical: Not on file  Tobacco Use  . Smoking status: Former Smoker    Packs/day: 0.50    Years: 15.00    Pack years: 7.50    Types: Cigarettes  . Smokeless tobacco: Never Used  Substance and Sexual Activity  . Alcohol use: No  . Drug use: No  . Sexual activity: Never  Lifestyle  . Physical activity    Days per week: Not on file    Minutes per session: Not on file  . Stress: Not on file  Relationships  . Social Musicianconnections    Talks on phone: Not on file    Gets together: Not on file    Attends religious service: Not on file    Active member of club or organization: Not on file    Attends meetings of clubs or organizations: Not on file    Relationship status: Not on file  . Intimate partner violence    Fear of current or ex partner: Not on file    Emotionally abused: Not on file    Physically abused: Not on file     Forced sexual activity: Not on file  Other Topics Concern  . Not on file  Social History Narrative  . Not on file    The patient currently resides (home / rehab facility / nursing home): Home The patient normally is (ambulatory / bedbound): Limited ambulation  FAMILY HISTORY:  Family History  Problem Relation Age of Onset  . Stomach cancer Sister 3125  . Vaginal cancer Sister 1630  . Heart disease Father   . Breast cancer Neg Hx     Otherwise negative/non-contributory.  REVIEW OF SYSTEMS:  Constitutional: denies any other weight loss, fever, chills, or sweats  Eyes:  denies any other vision changes, history of eye injury  ENT: denies sore throat, hearing problems  Respiratory: denies shortness of breath, wheezing  Cardiovascular: denies chest pain, palpitations  Gastrointestinal: denies abdominal pain, N/V, or diarrhea Musculoskeletal: denies any other joint pains or cramps  Skin: Denies any other rashes or skin discolorations except as per HPI Neurological: denies any other headache, dizziness, weakness  Psychiatric: Denies any other depression, anxiety   All other review of systems were otherwise negative   VITAL SIGNS:  BP (!) 173/84   Pulse 68   Temp (!) 97.5 F (36.4 C) (Temporal)   Resp 16   Ht 5\' 9"  (1.753 m)   Wt 210 lb (95.3 kg)   BMI 31.01 kg/m   PHYSICAL EXAM:  Constitutional:  -- Normal body habitus -- Awake, alert, and oriented x3  Eyes:  -- Pupils equally round and reactive to light  -- No scleral icterus  Ear, nose, throat:  -- No jugular venous distension -- No nasal drainage, bleeding Pulmonary:  -- No crackles  -- Equal breath sounds bilaterally -- Breathing non-labored at rest Cardiovascular:  -- S1, S2 present  -- No pericardial rubs  Gastrointestinal:  -- Abdomen soft, non-tender to palpation, and non-distended with no guarding/rebound tenderness -- No abdominal masses appreciated, pulsatile or otherwise  Musculoskeletal and  Integumentary:  -- Wounds or skin discoloration: 5 cm (wide) x 4 cm (tall) sacral-coccygeal firm subcutaneous mass with minimal surrounding ecchymosis and minimal tenderness to palpation, no surrounding erythema or drainage -- Extremities: B/L UE and LE FROM, though remained seated in wheelchair throughout her appointment, hands and feet warm  Neurologic:  -- Motor function: Intact and symmetric -- Sensation: Intact and symmetric  Labs:  CBC Latest Ref Rng & Units 11/06/2018 11/06/2018 08/23/2018  WBC 4.0 - 10.5 K/uL - 5.7 5.7  Hemoglobin 12.0 - 15.0 g/dL 16.113.9 09.612.2 11.6(L)  Hematocrit 36.0 - 46.0 % 41.0 40.0 36.4  Platelets 150 - 400 K/uL - 201 243   CMP Latest Ref Rng & Units 11/06/2018 11/06/2018 08/23/2018  Glucose 70 - 99 mg/dL 99 045(W104(H) 098(J134(H)  BUN 8 - 23 mg/dL 11 12 13   Creatinine 0.44 - 1.00 mg/dL 1.910.90 4.780.87 2.950.83  Sodium 135 - 145 mmol/L 140 142 140  Potassium 3.5 - 5.1 mmol/L 3.7 3.7 4.8  Chloride 98 - 111 mmol/L 102 103 106  CO2 22 - 32 mmol/L - 31 26  Calcium 8.9 - 10.3 mg/dL - 9.0 9.3  Total Protein 6.5 - 8.1 g/dL - 6.9 -  Total Bilirubin 0.3 - 1.2 mg/dL - 0.6 -  Alkaline Phos 38 - 126 U/L - 71 -  AST 15 - 41 U/L - 15 -  ALT 0 - 44 U/L - 11 -   Imaging studies:  CT Abdomen and Pelvis with Contrast (11/07/2019) 1. No acute abnormality identified in the abdomen or pelvis. 2. Decreased size of subcutaneous hematoma in the lower back. 3. New small bilateral pleural effusions. 4. Moderate-sized hiatal hernia. 5. Nonobstructing right nephrolithiasis. Increased size of right renal cyst. 6.  Aortic Atherosclerosis   Assessment/Plan:  73 y.o. female with evolving mid-lower back subcutaneous hematoma attributed to frequent falls (specifically a fall onto her back) with ongoing therapeutic anticoagulation for atrial fibrillation, complicated by additional co-morbidities including advanced chronological age, DM, HTN, CHF, and former chronic tobacco abuse (smoking).   - currently no  indication for surgical intervention  - differential diagnoses (post-traumatic hematoma >> lipoma) discussed  - natural history of post-traumatic  hematomas also likewise discussed with patient  - return to clinic as needed, instructed to call if any questions or concerns  All of the above recommendations were discussed with the patient, and all of patient's questions were answered to her expressed satisfaction.  Thank you for the opportunity to participate in this patient's care.  -- Scherrie GerlachJason E. Earlene Plateravis, MD, RPVI [place]: [facility] - Partnering for exceptional care. Office: [phone]

## 2019-05-28 NOTE — Patient Instructions (Signed)
Please call our office if you have any questions or concerns.      Hematoma A hematoma is a collection of blood. A hematoma can happen:  Under the skin.  In an organ.  In a body space.  In a joint space.  In other tissues. The blood can thicken (clot) to form a lump that you can see and feel. The lump is often hard and may become sore and tender. The lump can be very small or very big. Most hematomas get better in a few days to weeks. However, some hematomas may be serious and need medical care. What are the causes? This condition is caused by:  An injury.  Blood that leaks under the skin.  Problems from surgeries.  Medical conditions that cause bleeding or bruising. What increases the risk? You are more likely to develop this condition if:  You are an older adult.  You use medicines that thin your blood. What are the signs or symptoms? Symptoms depend on where the hematoma is in your body.  If the hematoma is under the skin, there is: ? A firm lump on the body. ? Pain and tenderness in the area. ? Bruising. The skin above the lump may be blue, dark blue, purple-red, or yellowish.  If the hematoma is deep in the tissues or body spaces, there may be: ? Blood in the stomach. This may cause pain in the belly (abdomen), weakness, passing out (fainting), and shortness of breath. ? Blood in the head. This may cause a headache, weakness, trouble speaking or understanding speech, or passing out. How is this diagnosed? This condition is diagnosed based on:  Your medical history.  A physical exam.  Imaging tests, such as ultrasound or CT scan.  Blood tests. How is this treated? Treatment depends on the cause, size, and location of the hematoma. Treatment may include:  Doing nothing. Many hematomas go away on their own without treatment.  Surgery or close monitoring. This may be needed for large hematomas or hematomas that affect the body's organs.  Medicines. These  may be given if a medical condition caused the hematoma. Follow these instructions at home: Managing pain, stiffness, and swelling   If told, put ice on the area. ? Put ice in a plastic bag. ? Place a towel between your skin and the bag. ? Leave the ice on for 20 minutes, 2-3 times a day for the first two days.  If told, put heat on the affected area after putting ice on the area for two days. Use the heat source that your doctor tells you to use. This could be a moist heat pack or a heating pad. To do this: ? Place a towel between your skin and the heat source. ? Leave the heat on for 20-30 minutes. ? Remove the heat if your skin turns bright red. This is very important if you are unable to feel pain, heat, or cold. You may have a greater risk of getting burned.  Raise (elevate) the affected area above the level of your heart while you are sitting or lying down.  Wrap the affected area with an elastic bandage, if told by your doctor. Do not wrap the bandage too tightly.  If your hematoma is on a leg or foot and is painful, your doctor may give you crutches. Use them as told by your doctor. General instructions  Take over-the-counter and prescription medicines only as told by your doctor.  Keep all follow-up visits as  told by your doctor. This is important. Contact a doctor if:  You have a fever.  The swelling or bruising gets worse.  You start to get more hematomas. Get help right away if:  Your pain gets worse.  Your pain is not getting better with medicine.  Your skin over the hematoma breaks or starts to bleed.  Your hematoma is in your chest or belly and you: ? Pass out. ? Feel weak. ? Become short of breath.  You have a hematoma on your scalp that is caused by a fall or injury, and you: ? Have a headache that gets worse. ? Have trouble speaking or understanding speech. ? Become less alert or you pass out. Summary  A hematoma is a collection of blood in any  part of your body.  Most hematomas get better on their own in a few days to weeks. Some may need medical care.  Follow instructions from your doctor about how to care for your hematoma.  Contact a doctor if the swelling or bruising gets worse, or if you are short of breath. This information is not intended to replace advice given to you by your health care provider. Make sure you discuss any questions you have with your health care provider. Document Released: 12/27/2004 Document Revised: 04/24/2018 Document Reviewed: 04/24/2018 Elsevier Interactive Patient Education  2019 [employer].

## 2019-12-01 ENCOUNTER — Emergency Department
Admission: EM | Admit: 2019-12-01 | Discharge: 2019-12-01 | Disposition: A | Discharge status: Home / Self Care | Payer: Medicare Other | Attending: Emergency Medicine | Admitting: Emergency Medicine

## 2019-12-01 ENCOUNTER — Other Ambulatory Visit: Payer: Self-pay

## 2019-12-01 ENCOUNTER — Emergency Department: Payer: Medicare Other

## 2019-12-01 DIAGNOSIS — E119 Type 2 diabetes mellitus without complications: Secondary | ICD-10-CM | POA: Diagnosis not present

## 2019-12-01 DIAGNOSIS — Z91010 Allergy to peanuts: Secondary | ICD-10-CM | POA: Diagnosis not present

## 2019-12-01 DIAGNOSIS — I5032 Chronic diastolic (congestive) heart failure: Secondary | ICD-10-CM | POA: Insufficient documentation

## 2019-12-01 DIAGNOSIS — I110 Hypertensive heart disease with heart failure: Secondary | ICD-10-CM | POA: Diagnosis not present

## 2019-12-01 DIAGNOSIS — Z794 Long term (current) use of insulin: Secondary | ICD-10-CM | POA: Insufficient documentation

## 2019-12-01 DIAGNOSIS — Z7901 Long term (current) use of anticoagulants: Secondary | ICD-10-CM | POA: Diagnosis not present

## 2019-12-01 DIAGNOSIS — Z79899 Other long term (current) drug therapy: Secondary | ICD-10-CM | POA: Insufficient documentation

## 2019-12-01 DIAGNOSIS — R531 Weakness: Secondary | ICD-10-CM | POA: Insufficient documentation

## 2019-12-01 DIAGNOSIS — Z87891 Personal history of nicotine dependence: Secondary | ICD-10-CM | POA: Diagnosis not present

## 2019-12-01 LAB — CBC WITH DIFFERENTIAL/PLATELET
Abs Immature Granulocytes: 0.01 10*3/uL (ref 0.00–0.07)
Basophils Absolute: 0 10*3/uL (ref 0.0–0.1)
Basophils Relative: 1 %
Eosinophils Absolute: 0.1 10*3/uL (ref 0.0–0.5)
Eosinophils Relative: 1 %
HCT: 47.1 % — ABNORMAL HIGH (ref 36.0–46.0)
Hemoglobin: 14.6 g/dL (ref 12.0–15.0)
Immature Granulocytes: 0 %
Lymphocytes Relative: 20 %
Lymphs Abs: 1.1 10*3/uL (ref 0.7–4.0)
MCH: 29.4 pg (ref 26.0–34.0)
MCHC: 31 g/dL (ref 30.0–36.0)
MCV: 95 fL (ref 80.0–100.0)
Monocytes Absolute: 0.5 10*3/uL (ref 0.1–1.0)
Monocytes Relative: 8 %
Neutro Abs: 4 10*3/uL (ref 1.7–7.7)
Neutrophils Relative %: 70 %
Platelets: 185 10*3/uL (ref 150–400)
RBC: 4.96 MIL/uL (ref 3.87–5.11)
RDW: 12.2 % (ref 11.5–15.5)
WBC: 5.7 10*3/uL (ref 4.0–10.5)
nRBC: 0 % (ref 0.0–0.2)

## 2019-12-01 LAB — COMPREHENSIVE METABOLIC PANEL
ALT: 22 U/L (ref 0–44)
AST: 24 U/L (ref 15–41)
Albumin: 4.2 g/dL (ref 3.5–5.0)
Alkaline Phosphatase: 84 U/L (ref 38–126)
Anion gap: 12 (ref 5–15)
BUN: 12 mg/dL (ref 8–23)
CO2: 26 mmol/L (ref 22–32)
Calcium: 9.7 mg/dL (ref 8.9–10.3)
Chloride: 104 mmol/L (ref 98–111)
Creatinine, Ser: 0.95 mg/dL (ref 0.44–1.00)
GFR calc Af Amer: >60 mL/min (ref >60)
GFR calc non Af Amer: 59 mL/min — ABNORMAL LOW (ref >60)
Glucose, Bld: 95 mg/dL (ref 70–99)
Potassium: 4.2 mmol/L (ref 3.5–5.1)
Sodium: 142 mmol/L (ref 135–145)
Total Bilirubin: 0.6 mg/dL (ref 0.3–1.2)
Total Protein: 7.7 g/dL (ref 6.5–8.1)

## 2019-12-01 LAB — TROPONIN I (HIGH SENSITIVITY): Troponin I (High Sensitivity): 6 ng/L (ref <18)

## 2019-12-01 LAB — BRAIN NATRIURETIC PEPTIDE: B Natriuretic Peptide: 271 pg/mL — ABNORMAL HIGH (ref 0.0–100.0)

## 2019-12-01 NOTE — ED Notes (Signed)
Monitor silenced for pt. Pt yet to have IV placed. IV team contacted this RN and state someone will be at bedside soon.

## 2019-12-01 NOTE — ED Notes (Signed)
Pt given cup of water. Water in sink turned on for several minutes. Will send urine if pt produces sample; if not, will notify EDP.

## 2019-12-01 NOTE — ED Provider Notes (Signed)
[facility] Emergency Department Provider Note   ____________________________________________    I have reviewed the triage vital signs and the nursing notes.   HISTORY  Chief Complaint Weakness     HPI Virginia Barnes is a 73 y.o. female with history of atrial fibrillation, CHF, diabetes, hypertension who presents for evaluation of generalized weakness.  Was sent from [place] home for evaluation of increased weakness.  No reports of fevers chills.  No cough.  Patient denies shortness of breath.  Denies chest pain to me.  No nausea vomiting or abdominal pain.  Overall she says she feels "normal ".  Past Medical History:  Diagnosis Date  . Arrhythmia    atrial fibrillation  . CHF (congestive heart failure) ([place])   . Diabetes mellitus without complication ([place])   . Diverticulitis   . Frequent falls   . Hypertension     Patient Active Problem List   Diagnosis Date Noted  . Hematoma 07/25/2018  . Frequent falls 07/15/2018  . Imbalance 07/15/2018  . 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 Chronic diastolic heart failure ([place]) 02/17/2018  . HTN (hypertension) 02/17/2018  . PAF (paroxysmal atrial fibrillation) ([place]) 02/17/2018  . Lymphedema 02/17/2018  . Diabetes ([place]) 02/17/2018  . Sepsis ([place]) 01/30/2018  . Hypoglycemia 01/12/2018  . Knee pain 05/17/2017    Past Surgical History:  Procedure Laterality Date  . ABDOMINAL HYSTERECTOMY    . CARDIAC CATHETERIZATION Left 04/13/2016   Procedure: Left Heart Cath and Coronary Angiography;  Surgeon: Dionisio David, MD;  Location: [place] CV [facility];  Service: Cardiovascular;  Laterality: Left;  . JOINT REPLACEMENT      Prior to Admission medications   Medication Sig Start Date End Date Taking? Authorizing Provider  Calcium Carbonate-Vitamin D3 (CALCIUM 600/VITAMIN D) 600-400 MG-UNIT TABS Take 1 tablet by mouth 2 (two) times daily.    [provider]  cetirizine  (ZYRTEC) 10 MG tablet Take 10 mg by mouth daily.     [provider]  ferrous sulfate 325 (65 FE) MG tablet Take 1 tablet (325 mg total) by mouth 2 (two) times daily with a meal. 07/28/18   Gladstone Lighter, MD  fesoterodine (TOVIAZ) 8 MG TB24 tablet Take 8 mg by mouth daily.    [provider]  fluorometholone (FML) 0.1 % ophthalmic suspension Place 1 drop into the right eye 2 (two) times daily.    [provider]  fluticasone (FLONASE) 50 MCG/ACT nasal spray Place 2 sprays into both nostrils daily.    [provider]  furosemide (LASIX) 40 MG tablet Take 0.5 tablets (20 mg total) by mouth daily. 07/28/18 07/28/19  Gladstone Lighter, MD  hydrALAZINE (APRESOLINE) 25 MG tablet Take 25 mg by mouth 2 (two) times daily.    [provider]  HYDROcodone-acetaminophen (NORCO/VICODIN) 5-325 MG tablet Take 1 tablet by mouth every 6 (six) hours as needed for severe pain. 08/23/18   Delman Kitten, MD  ipratropium (ATROVENT) 0.03 % nasal spray Place 2 sprays into both nostrils 3 (three) times daily as needed for rhinitis.    [provider]  NOVOLOG FLEXPEN 100 UNIT/ML FlexPen Inject 0-10 Units into the skin 3 (three) times daily with meals. <60 Call MD 151-200 - 2 units 201-250 4 units 251-300 - 6 units 301-350 - 8 units 351-400 - 10 units >400 Call MD 05/23/18   [provider]  omeprazole (PRILOSEC) 40 MG capsule Take 1 capsule by mouth daily. 09/26/15  [provider]  potassium chloride (K-DUR,KLOR-CON) 10 MEQ tablet Take 10 mEq by mouth daily. 05/09/18   [provider]  prednisoLONE acetate (PRED FORTE) 1 % ophthalmic suspension Place 1 drop into both eyes 2 (two) times daily.     [provider]  rivaroxaban (XARELTO) 20 MG TABS tablet Take 1 tablet (20 mg total) by mouth daily. 07/28/18   Enid Baas, MD  rosuvastatin (CRESTOR) 20 MG tablet Take 20 mg by mouth every evening.     [provider]    sertraline (ZOLOFT) 100 MG tablet Take 100 mg by mouth daily.    [provider]  sotalol (BETAPACE) 80 MG tablet Take 80 mg by mouth 2 (two) times daily.    [provider]  valACYclovir (VALTREX) 1000 MG tablet Take 1,000 mg by mouth daily.    [provider]     Allergies Peanuts [peanut oil], Lasix [furosemide], and Lisinopril  Family History  Problem Relation Age of Onset  . Stomach cancer Sister 62  . Vaginal cancer Sister 44  . Heart disease Father   . Breast cancer Neg Hx     Social History Social History   Tobacco Use  . Smoking status: Former Smoker    Packs/day: 0.50    Years: 15.00    Pack years: 7.50    Types: Cigarettes  . Smokeless tobacco: Never Used  Substance Use Topics  . Alcohol use: No  . Drug use: No    Review of Systems  Constitutional: As above Eyes: No visual changes.  ENT: No sore throat. Cardiovascular: As above Respiratory: As above Gastrointestinal: No abdominal pain.  Genitourinary: Negative for dysuria. Musculoskeletal: Negative for back pain. Skin: Negative for rash. Neurological: Negative for headaches   ____________________________________________   PHYSICAL EXAM:  VITAL SIGNS: ED Triage Vitals  Enc Vitals Group     BP 12/01/19 1137 123/71     Pulse Rate 12/01/19 1113 62     Resp 12/01/19 1120 14     Temp 12/01/19 1110 (!) 97.4 F (36.3 C)     Temp Source 12/01/19 1110 Oral     SpO2 12/01/19 1113 97 %     Weight 12/01/19 1111 96.2 kg (212 lb)     Height 12/01/19 1111 1.753 m (5\' 9" )     Head Circumference --      Peak Flow --      Pain Score 12/01/19 1111 4     Pain Loc --      Pain Edu? --      Excl. in GC? --     Constitutional: Alert and oriented.  Eyes: Conjunctivae are normal.   Nose: No congestion/rhinnorhea. Mouth/Throat: Mucous membranes are moist.    Cardiovascular: Normal rate, regular rhythm.  Good peripheral circulation. Respiratory: Normal respiratory effort.  No  retractions.  Gastrointestinal: Soft and nontender. No distention.    Musculoskeletal: Bilateral lower extremity edema..  Warm and well perfused Neurologic:  Normal speech and language. No gross focal neurologic deficits are appreciated.  Skin:  Skin is warm, dry and intact. No rash noted. Psychiatric: Mood and affect are normal. Speech and behavior are normal.  ____________________________________________   LABS (all labs ordered are listed, but only abnormal results are displayed)  Labs Reviewed  CBC WITH DIFFERENTIAL/PLATELET - Abnormal; Notable for the following components:      Result Value   HCT 47.1 (*)    All other components within normal limits  COMPREHENSIVE METABOLIC PANEL - Abnormal; Notable  for the following components:   GFR calc non Af Amer 59 (*)    All other components within normal limits  BRAIN NATRIURETIC PEPTIDE - Abnormal; Notable for the following components:   B Natriuretic Peptide 271.0 (*)    All other components within normal limits  URINALYSIS, COMPLETE (UACMP) WITH MICROSCOPIC  TROPONIN I (HIGH SENSITIVITY)   ____________________________________________  EKG  ED ECG REPORT I, Jene Everyobert Luisa Louk, the attending physician, personally viewed and interpreted this ECG.  Date: 12/01/2019  Rhythm: normal sinus rhythm QRS Axis: normal Intervals: normal ST/T Wave abnormalities: normal Narrative Interpretation: no evidence of acute ischemia  ____________________________________________  RADIOLOGY  Chest x-ray unremarkable ____________________________________________   PROCEDURES  Procedure(s) performed: No  Procedures   Critical Care performed: No ____________________________________________   INITIAL IMPRESSION / ASSESSMENT AND PLAN / ED COURSE  Pertinent labs & imaging results that were available during my care of the patient were reviewed by me and considered in my medical decision making (see chart for details).  Patient overall  well-appearing in no acute distress.  Exam is reassuring.  We will check labs, including chest x-ray.  Lab work, chest x-ray is reassuring, no evidence of infection, appropriate for discharge at this time    ____________________________________________   FINAL CLINICAL IMPRESSION(S) / ED DIAGNOSES  Final diagnoses:  Generalized weakness        Note:  This document was prepared using Dragon voice recognition software and may include unintentional dictation errors.   Jene EveryKinner, Rondo Spittler, MD 12/01/19 1538

## 2019-12-01 NOTE — ED Notes (Signed)
Pt assisted off bedpan. Unable to produce sample.

## 2019-12-01 NOTE — ED Notes (Addendum)
Attempting for EKG but monitor not currently picking up signal. Will keep trying and switch to portable soon if still not working.

## 2019-12-01 NOTE — ED Notes (Signed)
Attempted for 22g IV at L ac. Vein blew.

## 2019-12-01 NOTE — Progress Notes (Signed)
VAST consulted to obtain IV access. Utilized [place] to assess both arms. Extremely limited vasculature. Cephalic veins in upper bilateral arms too small for access. Attempted in left forearm unsuccessfully. Also attempted in proximal right forearm unsuccessfully. ER RN notified.

## 2019-12-01 NOTE — ED Notes (Signed)
IV team reports no success obtaining IV access even with [place] use. Called lab. State they will send someone to bedside soon to assist in collecting bloodwork.

## 2019-12-01 NOTE — ED Notes (Signed)
Pt states lab came and collected bloodwork. Pt called out using call bell in request for food. Told pt will check with EDP.

## 2019-12-01 NOTE — ED Notes (Signed)
Attempted for 22g at L fa. Vein blew.

## 2019-12-01 NOTE — ED Triage Notes (Signed)
Pt in via EMS from [place] d/t weakness/failure to thrive. 156/65 BP; 71 NSR; 198 BG with EMS. Edema 4+ up to knees. Chest pressure and SOB when going to bed at night per facility. On xarelto per EMS; pt "slid down the wall yeserday to floor; did not hit head" per facility. EMS stroke eval negative. Per EMS, pt A&Ox3-4.

## 2019-12-01 NOTE — ED Notes (Signed)
IV team at bedside 

## 2019-12-01 NOTE — ED Notes (Addendum)
Pt states she urinated in her briefs when this RN prompted to try for urine sample. Large amount of urine present in briefs. Pt also had small dark brown BM. Urine foul-smelling. Peri care provided and briefs changed. Pt agrees to notify this RN next time she can provide urine sample.

## 2019-12-01 NOTE — ED Notes (Addendum)
Pt called out using call bell. Asked to place on bedpan again as she stated she had started to urinate. Placed on bedpan. Switched out briefs again. Pt urinated small amount into briefs but was then unable to provide anything in bedpan.

## 2019-12-01 NOTE — ED Notes (Signed)
Will place IV once imaging done with pt. EKG to EDP Kinner.

## 2019-12-01 NOTE — Discharge Instructions (Addendum)
Patient had reassuring labs and work-up, appropriate for outpatient follow-up as needed

## 2019-12-01 NOTE — ED Notes (Signed)
[facility]  CALLED  FOR  TRANSPORT 

## 2019-12-01 NOTE — ED Notes (Signed)
Pt placed on bedpan and is attempting to urinate. Will send sample if pt able to produce.

## 2020-01-03 ENCOUNTER — Other Ambulatory Visit: Payer: Self-pay

## 2020-01-03 ENCOUNTER — Emergency Department
Admission: EM | Admit: 2020-01-03 | Discharge: 2020-01-03 | Disposition: A | Discharge status: Home / Self Care | Payer: Medicare Other | Attending: Emergency Medicine | Admitting: Emergency Medicine

## 2020-01-03 DIAGNOSIS — E119 Type 2 diabetes mellitus without complications: Secondary | ICD-10-CM | POA: Insufficient documentation

## 2020-01-03 DIAGNOSIS — Z794 Long term (current) use of insulin: Secondary | ICD-10-CM | POA: Insufficient documentation

## 2020-01-03 DIAGNOSIS — I5032 Chronic diastolic (congestive) heart failure: Secondary | ICD-10-CM | POA: Insufficient documentation

## 2020-01-03 DIAGNOSIS — R5383 Other fatigue: Secondary | ICD-10-CM | POA: Diagnosis present

## 2020-01-03 DIAGNOSIS — Z91010 Allergy to peanuts: Secondary | ICD-10-CM | POA: Diagnosis not present

## 2020-01-03 DIAGNOSIS — Z87891 Personal history of nicotine dependence: Secondary | ICD-10-CM | POA: Diagnosis not present

## 2020-01-03 DIAGNOSIS — U071 COVID-19: Secondary | ICD-10-CM | POA: Insufficient documentation

## 2020-01-03 DIAGNOSIS — I110 Hypertensive heart disease with heart failure: Secondary | ICD-10-CM | POA: Diagnosis not present

## 2020-01-03 DIAGNOSIS — Z7901 Long term (current) use of anticoagulants: Secondary | ICD-10-CM | POA: Insufficient documentation

## 2020-01-03 DIAGNOSIS — Z79899 Other long term (current) drug therapy: Secondary | ICD-10-CM | POA: Insufficient documentation

## 2020-01-03 LAB — POC SARS CORONAVIRUS 2 AG: SARS Coronavirus 2 Ag: POSITIVE — AB

## 2020-01-03 NOTE — Discharge Instructions (Signed)
Patient had a positive rapid test, however she has no significant shortness of breath or need for admission

## 2020-01-03 NOTE — ED Triage Notes (Signed)
FIRST NURSE NOTE- here from [facility].  Per EMS staff report someone there had covid and sent patient out for covid testing.  She seemed tired to staff and so they thought she needed to be tested. Per EMS pt is oriented and VSS

## 2020-01-03 NOTE — ED Provider Notes (Signed)
[facility] Emergency Department Provider Note   ____________________________________________    I have reviewed the triage vital signs and the nursing notes.   HISTORY  Chief Complaint Medical Clearance     HPI Virginia Barnes is a 74 y.o. female with a history of CHF, diabetes, hypertension sent from nursing home for evaluation of fatigue and mildly increased work of breathing.  Patient reports she feels tired but denies shortness of breath, reports intermittent cough.  No reports of fevers.  Nursing home requests rapid Covid swab.  Patient denies chest pain, no myalgias.  No medications of been given  Past Medical History:  Diagnosis Date  . Arrhythmia    atrial fibrillation  . CHF (congestive heart failure) ([facility])   . Diabetes mellitus without complication ([facility])   . Diverticulitis   . Frequent falls   . Hypertension     Patient Active Problem List   Diagnosis Date Noted  . Hematoma 07/25/2018  . Frequent falls 07/15/2018  . Imbalance 07/15/2018  . 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 Chronic diastolic heart failure ([facility]) 02/17/2018  . HTN (hypertension) 02/17/2018  . PAF (paroxysmal atrial fibrillation) ([facility]) 02/17/2018  . Lymphedema 02/17/2018  . Diabetes ([facility]) 02/17/2018  . Sepsis ([facility]) 01/30/2018  . Hypoglycemia 01/12/2018  . Knee pain 05/17/2017    Past Surgical History:  Procedure Laterality Date  . ABDOMINAL HYSTERECTOMY    . CARDIAC CATHETERIZATION Left 04/13/2016   Procedure: Left Heart Cath and Coronary Angiography;  Surgeon: Laurier Nancy, MD;  Location: [facility] INVASIVE CV LAB;  Service: Cardiovascular;  Laterality: Left;  . JOINT REPLACEMENT      Prior to Admission medications   Medication Sig Start Date End Date Taking? Authorizing Provider  Calcium Carbonate-Vitamin D3 (CALCIUM 600/VITAMIN D) 600-400 MG-UNIT TABS Take 1 tablet by mouth 2 (two) times daily.    [provider]  cetirizine (ZYRTEC) 10 MG  tablet Take 10 mg by mouth daily.     [provider]  ferrous sulfate 325 (65 FE) MG tablet Take 1 tablet (325 mg total) by mouth 2 (two) times daily with a meal. 07/28/18   Enid Baas, MD  fesoterodine (TOVIAZ) 8 MG TB24 tablet Take 8 mg by mouth daily.    [provider]  fluorometholone (FML) 0.1 % ophthalmic suspension Place 1 drop into the right eye 2 (two) times daily.    [provider]  fluticasone (FLONASE) 50 MCG/ACT nasal spray Place 2 sprays into both nostrils daily.    [provider]  furosemide (LASIX) 40 MG tablet Take 0.5 tablets (20 mg total) by mouth daily. 07/28/18 07/28/19  Enid Baas, MD  hydrALAZINE (APRESOLINE) 25 MG tablet Take 25 mg by mouth 2 (two) times daily.    [provider]  HYDROcodone-acetaminophen (NORCO/VICODIN) 5-325 MG tablet Take 1 tablet by mouth every 6 (six) hours as needed for severe pain. 08/23/18   Sharyn Creamer, MD  ipratropium (ATROVENT) 0.03 % nasal spray Place 2 sprays into both nostrils 3 (three) times daily as needed for rhinitis.    [provider]  NOVOLOG FLEXPEN 100 UNIT/ML FlexPen Inject 0-10 Units into the skin 3 (three) times daily with meals. <60 Call MD 151-200 - 2 units 201-250 4 units 251-300 - 6 units 301-350 - 8 units 351-400 - 10 units >400 Call MD 05/23/18   [provider]  omeprazole (PRILOSEC) 40 MG capsule Take 1 capsule by mouth daily. 09/26/15   [provider]  potassium chloride (K-DUR,KLOR-CON) 10 MEQ tablet Take 10 mEq by mouth daily. 05/09/18   [provider]  prednisoLONE acetate (PRED FORTE) 1 % ophthalmic suspension Place 1 drop into both eyes 2 (two) times daily.     [provider]  rivaroxaban (XARELTO) 20 MG TABS tablet Take 1 tablet (20 mg total) by mouth daily. 07/28/18   Enid Baas, MD  rosuvastatin (CRESTOR) 20 MG tablet Take 20 mg by mouth every evening.     [provider]  sertraline (ZOLOFT)  100 MG tablet Take 100 mg by mouth daily.    [provider]  sotalol (BETAPACE) 80 MG tablet Take 80 mg by mouth 2 (two) times daily.    [provider]  valACYclovir (VALTREX) 1000 MG tablet Take 1,000 mg by mouth daily.    [provider]     Allergies Peanuts [peanut oil], Lasix [furosemide], and Lisinopril  Family History  Problem Relation Age of Onset  . Stomach cancer Sister 41  . Vaginal cancer Sister 56  . Heart disease Father   . Breast cancer Neg Hx     Social History Social History   Tobacco Use  . Smoking status: Former Smoker    Packs/day: 0.50    Years: 15.00    Pack years: 7.50    Types: Cigarettes  . Smokeless tobacco: Never Used  Substance Use Topics  . Alcohol use: No  . Drug use: No    Review of Systems  Constitutional: No fever/chills, fatigue as above  ENT: No sore throat. Respiratory: As above CV: As above Gastrointestinal: No abdominal pain.   Genitourinary: Negative for dysuria. Musculoskeletal: Negative for back pain. Skin: Negative for rash. Neurological: Negative for headaches     ____________________________________________   PHYSICAL EXAM:  VITAL SIGNS: ED Triage Vitals  Enc Vitals Group     BP 01/03/20 1622 (!) 132/51     Pulse Rate 01/03/20 1622 74     Resp 01/03/20 1622 15     Temp 01/03/20 1622 98.6 F (37 C)     Temp Source 01/03/20 1622 Oral     SpO2 01/03/20 1622 97 %     Weight 01/03/20 1620 96.2 kg (212 lb)     Height 01/03/20 1620 1.753 m (5\' 9" )     Head Circumference --      Peak Flow --      Pain Score 01/03/20 1620 0     Pain Loc --      Pain Edu? --      Excl. in GC? --      Constitutional: Alert and oriented.  Nose: No congestion/rhinnorhea. Mouth/Throat: Mucous membranes are moist.   Cardiovascular: Normal rate, regular rhythm.  Respiratory: Normal respiratory effort.  No retractions.  Clear to auscultation, reassuring exam  Musculoskeletal: No lower extremity  tenderness nor edema.   Neurologic:  Normal speech and language. No gross focal neurologic deficits are appreciated.   Skin:  Skin is warm, dry and intact. No rash noted.   ____________________________________________   LABS (all labs ordered are listed, but only abnormal results are displayed)  Labs Reviewed  POC SARS CORONAVIRUS 2 AG - Abnormal; Notable for the following components:      Result Value   SARS Coronavirus 2 Ag POSITIVE (*)    All other components within normal limits  POC SARS CORONAVIRUS 2 AG -  ED   ____________________________________________  EKG   ____________________________________________  RADIOLOGY  None ____________________________________________   PROCEDURES  Procedure(s)  performed: No  Procedures   Critical Care performed: No ____________________________________________   INITIAL IMPRESSION / ASSESSMENT AND PLAN / ED COURSE  Pertinent labs & imaging results that were available during my care of the patient were reviewed by me and considered in my medical decision making (see chart for details).  Patient overall well-appearing in no acute distress, normal heart rate temperature respiratory rate and pulse oximetry.  No increased work of breathing noted.  However given lethargy and nursing home stay rapid antigen sent and returned positive.  No indication for admission at this time, close outpatient follow-up recommended.  Return precautions discussed   ____________________________________________   FINAL CLINICAL IMPRESSION(S) / ED DIAGNOSES  Final diagnoses:  COVID-19      NEW MEDICATIONS STARTED DURING THIS VISIT:  New Prescriptions   No medications on file     Note:  This document was prepared using Dragon voice recognition software and may include unintentional dictation errors.   Lavonia Drafts, MD 01/03/20 [phone]

## 2020-01-03 NOTE — ED Notes (Signed)
Attempted to call [facility] to inform them pt would be returning. No answer.

## 2020-01-03 NOTE — ED Notes (Signed)
Son called back angry that we aren't taking her mother back to the nursing home since we went and picked her up. Attempted to explain the difference between a hospital and ems services and how we are NOT related. Son hung up on me so I have no idea if he is picking up the patient or not.

## 2020-01-03 NOTE — ED Notes (Signed)
Phone given to pt - son wants her to call him.

## 2020-01-03 NOTE — ED Notes (Signed)
Son will come and pick up Ms. Virginia Barnes. His phone number is [phone]

## 2020-01-03 NOTE — ED Notes (Signed)
Ems on scene for transport. Pt previously discharged.

## 2020-01-03 NOTE — ED Triage Notes (Signed)
Pt arrived via EMS from [facility] - pt was sent for rapid covid testing - pt is alert and oriented and answers questions appropriately - pt denies any symptoms except being tired x2 days

## 2020-01-18 ENCOUNTER — Other Ambulatory Visit: Payer: Self-pay

## 2020-01-18 ENCOUNTER — Emergency Department: Payer: Medicare Other

## 2020-01-18 ENCOUNTER — Inpatient Hospital Stay
Admission: EM | Admit: 2020-01-18 | Discharge: 2020-01-21 | DRG: 683 | Disposition: A | Discharge status: Home / Self Care | Payer: Medicare Other | Source: Skilled Nursing Facility | Attending: Internal Medicine | Admitting: Internal Medicine

## 2020-01-18 ENCOUNTER — Encounter: Payer: Self-pay | Admitting: Emergency Medicine

## 2020-01-18 DIAGNOSIS — Z8616 Personal history of COVID-19: Secondary | ICD-10-CM | POA: Diagnosis not present

## 2020-01-18 DIAGNOSIS — Z79899 Other long term (current) drug therapy: Secondary | ICD-10-CM

## 2020-01-18 DIAGNOSIS — I5032 Chronic diastolic (congestive) heart failure: Secondary | ICD-10-CM | POA: Diagnosis present

## 2020-01-18 DIAGNOSIS — I110 Hypertensive heart disease with heart failure: Secondary | ICD-10-CM | POA: Diagnosis present

## 2020-01-18 DIAGNOSIS — Z87891 Personal history of nicotine dependence: Secondary | ICD-10-CM

## 2020-01-18 DIAGNOSIS — Z7189 Other specified counseling: Secondary | ICD-10-CM

## 2020-01-18 DIAGNOSIS — E875 Hyperkalemia: Secondary | ICD-10-CM | POA: Diagnosis present

## 2020-01-18 DIAGNOSIS — I10 Essential (primary) hypertension: Secondary | ICD-10-CM | POA: Diagnosis not present

## 2020-01-18 DIAGNOSIS — Z66 Do not resuscitate: Secondary | ICD-10-CM

## 2020-01-18 DIAGNOSIS — R296 Repeated falls: Secondary | ICD-10-CM | POA: Diagnosis present

## 2020-01-18 DIAGNOSIS — E119 Type 2 diabetes mellitus without complications: Secondary | ICD-10-CM | POA: Diagnosis present

## 2020-01-18 DIAGNOSIS — R0902 Hypoxemia: Secondary | ICD-10-CM | POA: Diagnosis present

## 2020-01-18 DIAGNOSIS — N179 Acute kidney failure, unspecified: Principal | ICD-10-CM | POA: Diagnosis present

## 2020-01-18 DIAGNOSIS — Z794 Long term (current) use of insulin: Secondary | ICD-10-CM | POA: Diagnosis not present

## 2020-01-18 DIAGNOSIS — E860 Dehydration: Secondary | ICD-10-CM

## 2020-01-18 DIAGNOSIS — F0390 Unspecified dementia without behavioral disturbance: Secondary | ICD-10-CM | POA: Diagnosis present

## 2020-01-18 DIAGNOSIS — N390 Urinary tract infection, site not specified: Secondary | ICD-10-CM | POA: Diagnosis present

## 2020-01-18 DIAGNOSIS — U071 COVID-19: Secondary | ICD-10-CM | POA: Diagnosis not present

## 2020-01-18 DIAGNOSIS — Z515 Encounter for palliative care: Secondary | ICD-10-CM

## 2020-01-18 DIAGNOSIS — R627 Adult failure to thrive: Secondary | ICD-10-CM | POA: Diagnosis present

## 2020-01-18 DIAGNOSIS — Z86711 Personal history of pulmonary embolism: Secondary | ICD-10-CM | POA: Diagnosis not present

## 2020-01-18 DIAGNOSIS — I480 Paroxysmal atrial fibrillation: Secondary | ICD-10-CM | POA: Diagnosis present

## 2020-01-18 DIAGNOSIS — E870 Hyperosmolality and hypernatremia: Secondary | ICD-10-CM | POA: Diagnosis present

## 2020-01-18 DIAGNOSIS — Z888 Allergy status to other drugs, medicaments and biological substances status: Secondary | ICD-10-CM | POA: Diagnosis not present

## 2020-01-18 DIAGNOSIS — Z8249 Family history of ischemic heart disease and other diseases of the circulatory system: Secondary | ICD-10-CM | POA: Diagnosis not present

## 2020-01-18 DIAGNOSIS — Z91010 Allergy to peanuts: Secondary | ICD-10-CM | POA: Diagnosis not present

## 2020-01-18 LAB — CBC
HCT: 57.5 % — ABNORMAL HIGH (ref 36.0–46.0)
Hemoglobin: 16.7 g/dL — ABNORMAL HIGH (ref 12.0–15.0)
MCH: 29.3 pg (ref 26.0–34.0)
MCHC: 29 g/dL — ABNORMAL LOW (ref 30.0–36.0)
MCV: 100.9 fL — ABNORMAL HIGH (ref 80.0–100.0)
Platelets: 247 10*3/uL (ref 150–400)
RBC: 5.7 MIL/uL — ABNORMAL HIGH (ref 3.87–5.11)
RDW: 13.5 % (ref 11.5–15.5)
WBC: 8.9 10*3/uL (ref 4.0–10.5)
nRBC: 0 % (ref 0.0–0.2)

## 2020-01-18 LAB — BASIC METABOLIC PANEL
Anion gap: 13 (ref 5–15)
BUN: 43 mg/dL — ABNORMAL HIGH (ref 8–23)
CO2: 25 mmol/L (ref 22–32)
Calcium: 9.9 mg/dL (ref 8.9–10.3)
Chloride: 119 mmol/L — ABNORMAL HIGH (ref 98–111)
Creatinine, Ser: 1.27 mg/dL — ABNORMAL HIGH (ref 0.44–1.00)
GFR calc Af Amer: 48 mL/min — ABNORMAL LOW (ref >60)
GFR calc non Af Amer: 42 mL/min — ABNORMAL LOW (ref >60)
Glucose, Bld: 171 mg/dL — ABNORMAL HIGH (ref 70–99)
Potassium: 5.4 mmol/L — ABNORMAL HIGH (ref 3.5–5.1)
Sodium: 157 mmol/L — ABNORMAL HIGH (ref 135–145)

## 2020-01-18 LAB — GLUCOSE, CAPILLARY: Glucose-Capillary: 142 mg/dL — ABNORMAL HIGH (ref 70–99)

## 2020-01-18 MED ORDER — ONDANSETRON HCL 4 MG PO TABS: 4.0000 mg | ORAL_TABLET | Freq: Four times a day (QID) | ORAL | Status: DC | PRN

## 2020-01-18 MED ORDER — SODIUM CHLORIDE 0.9 % IV BOLUS
500.0000 mL | Freq: Once | INTRAVENOUS | Status: AC
  Administered 2020-01-18: 500 mL via INTRAVENOUS

## 2020-01-18 MED ORDER — CALCIUM CARBONATE-VITAMIN D 500-200 MG-UNIT PO TABS
1.0000 | ORAL_TABLET | Freq: Two times a day (BID) | ORAL | Status: DC
  Administered 2020-01-19 – 2020-01-20 (×4): 1 via ORAL
  Filled 2020-01-18 (×4): qty 1

## 2020-01-18 MED ORDER — ACETAMINOPHEN 325 MG PO TABS: 650.0000 mg | ORAL_TABLET | Freq: Four times a day (QID) | ORAL | Status: DC | PRN

## 2020-01-18 MED ORDER — FERROUS SULFATE 325 (65 FE) MG PO TABS
325.0000 mg | ORAL_TABLET | Freq: Two times a day (BID) | ORAL | Status: DC
  Administered 2020-01-19 (×2): 325 mg via ORAL
  Filled 2020-01-18 (×2): qty 1

## 2020-01-18 MED ORDER — SOTALOL HCL 80 MG PO TABS
80.0000 mg | ORAL_TABLET | Freq: Two times a day (BID) | ORAL | Status: DC
  Administered 2020-01-19 – 2020-01-21 (×6): 80 mg via ORAL
  Filled 2020-01-18 (×7): qty 1

## 2020-01-18 MED ORDER — HYOSCYAMINE SULFATE 0.125 MG SL SUBL
0.1250 mg | SUBLINGUAL_TABLET | SUBLINGUAL | Status: DC | PRN
  Filled 2020-01-18: qty 1

## 2020-01-18 MED ORDER — CYCLOSPORINE 0.05 % OP EMUL
1.0000 [drp] | Freq: Two times a day (BID) | OPHTHALMIC | Status: DC
  Administered 2020-01-19 – 2020-01-21 (×5): 1 [drp] via OPHTHALMIC
  Filled 2020-01-18 (×7): qty 1

## 2020-01-18 MED ORDER — LORATADINE 10 MG PO TABS
10.0000 mg | ORAL_TABLET | Freq: Every day | ORAL | Status: DC
  Administered 2020-01-19: 10 mg via ORAL
  Filled 2020-01-18 (×2): qty 1

## 2020-01-18 MED ORDER — RIVAROXABAN 20 MG PO TABS
20.0000 mg | ORAL_TABLET | Freq: Every day | ORAL | Status: DC
  Administered 2020-01-19 – 2020-01-20 (×2): 20 mg via ORAL
  Filled 2020-01-18 (×3): qty 1

## 2020-01-18 MED ORDER — ROSUVASTATIN CALCIUM 20 MG PO TABS
20.0000 mg | ORAL_TABLET | Freq: Every evening | ORAL | Status: DC
  Administered 2020-01-19 – 2020-01-20 (×2): 20 mg via ORAL
  Filled 2020-01-18 (×2): qty 1

## 2020-01-18 MED ORDER — LACTATED RINGERS IV SOLN: INTRAVENOUS | Status: DC

## 2020-01-18 MED ORDER — VALACYCLOVIR HCL 500 MG PO TABS
1000.0000 mg | ORAL_TABLET | Freq: Every day | ORAL | Status: DC
  Administered 2020-01-19 – 2020-01-21 (×3): 1000 mg via ORAL
  Filled 2020-01-18 (×3): qty 2

## 2020-01-18 MED ORDER — ACETAMINOPHEN 650 MG RE SUPP: 650.0000 mg | Freq: Four times a day (QID) | RECTAL | Status: DC | PRN

## 2020-01-18 MED ORDER — PANTOPRAZOLE SODIUM 40 MG PO TBEC
40.0000 mg | DELAYED_RELEASE_TABLET | Freq: Every day | ORAL | Status: DC
  Administered 2020-01-19 – 2020-01-21 (×2): 40 mg via ORAL
  Filled 2020-01-18 (×3): qty 1

## 2020-01-18 MED ORDER — IPRATROPIUM BROMIDE 0.03 % NA SOLN
2.0000 | Freq: Four times a day (QID) | NASAL | Status: DC
  Administered 2020-01-19: 10:00:00 2 via NASAL
  Filled 2020-01-18: qty 30

## 2020-01-18 MED ORDER — OXYBUTYNIN CHLORIDE ER 5 MG PO TB24
5.0000 mg | ORAL_TABLET | Freq: Every day | ORAL | Status: DC
  Administered 2020-01-20 – 2020-01-21 (×2): 5 mg via ORAL
  Filled 2020-01-18 (×3): qty 1

## 2020-01-18 MED ORDER — ONDANSETRON HCL 4 MG/2ML IJ SOLN: 4.0000 mg | Freq: Four times a day (QID) | INTRAMUSCULAR | Status: DC | PRN

## 2020-01-18 MED ORDER — SERTRALINE HCL 50 MG PO TABS
100.0000 mg | ORAL_TABLET | Freq: Every day | ORAL | Status: DC
  Administered 2020-01-19 – 2020-01-21 (×3): 100 mg via ORAL
  Filled 2020-01-18 (×4): qty 2

## 2020-01-18 MED ORDER — POTASSIUM CHLORIDE CRYS ER 10 MEQ PO TBCR: 10.0000 meq | EXTENDED_RELEASE_TABLET | Freq: Every day | ORAL | Status: DC

## 2020-01-18 MED ORDER — FLUOROMETHOLONE 0.1 % OP SUSP
1.0000 [drp] | Freq: Two times a day (BID) | OPHTHALMIC | Status: DC
  Administered 2020-01-19 – 2020-01-21 (×5): 1 [drp] via OPHTHALMIC
  Filled 2020-01-18 (×2): qty 5

## 2020-01-18 MED ORDER — INSULIN ASPART 100 UNIT/ML ~~LOC~~ SOLN
0.0000 [IU] | Freq: Three times a day (TID) | SUBCUTANEOUS | Status: DC
  Administered 2020-01-20: 3 [IU] via SUBCUTANEOUS
  Administered 2020-01-20: 2 [IU] via SUBCUTANEOUS
  Administered 2020-01-21: 09:00:00 3 [IU] via SUBCUTANEOUS
  Administered 2020-01-21: 12:00:00 5 [IU] via SUBCUTANEOUS
  Filled 2020-01-18 (×4): qty 1

## 2020-01-18 MED ORDER — INSULIN ASPART 100 UNIT/ML ~~LOC~~ SOLN
0.0000 [IU] | Freq: Every day | SUBCUTANEOUS | Status: DC
  Filled 2020-01-18 (×2): qty 1

## 2020-01-18 NOTE — ED Notes (Signed)
Pt appears very sleepy on assessment, states she feels weak. Pt's mucous membranes are dry and patient is slightly sweaty.

## 2020-01-18 NOTE — H&P (Signed)
History and Physical   Virginia Barnes ASN:[id number] DOB: November 24, 1946 DOA: 01/18/2020  Referring MD/NP/PA: Dr. Sharyn Creamer  PCP: Margaretann Loveless, MD   Outpatient Specialists: Dr. Malvin Johns neurology  Patient coming from: [facility]  Chief Complaint: Hypoxia and weakness  HPI: Virginia Barnes is a 74 y.o. female with medical history significant of recent diagnosis of COVID-19 pneumonia about 2 weeks ago, diastolic function CHF, diabetes, hypertension, frequent falls, suspected UTI who was brought in from [facility] due to decreased oral intake and functioning since her diagnosis of COVID-19 about 2 weeks ago.  She has had no more fever or chills she has had no significant symptoms of the Covid but patient has gotten weak and weak and dehydrated.  She was suspected to have UTI from there and was sent to the ER for evaluation.  In the ER patient was having oxygen sat of 98% on room air with no respiratory symptoms.  Lateral abnormalities was however noted.  Patient also noted to have failure to thrive probably acid reaction to her recent COVID-19 diagnosis.  She is hypertensive with systolic of 176 on arrival and blood sugar is up to 200.  At this point she is being admitted for supportive care and failure to thrive with significant hypotension and hypernatremia.  ED Course: Temperature is 98.9 blood pressure 174/88 pulse 84 respirate 22 oxygen sat 91% on room air.  White count 8.9 hemoglobin 16.7 platelets 247.  Sodium is 157 potassium 5.4 and chloride 119.  CO2 25.  Creatinine is 1.27 with BUN 43.  Calcium 9.9.  Chest x-ray shows no acute findings.  Patient is being admitted with electrolyte abnormalities and failure to thrive.  Review of Systems: As per HPI otherwise 10 point review of systems negative.    Past Medical History:  Diagnosis Date  . Arrhythmia    atrial fibrillation  . CHF (congestive heart failure) (HCC)   . Diabetes mellitus without complication (HCC)   . Diverticulitis   .  Frequent falls   . Hypertension     Past Surgical History:  Procedure Laterality Date  . ABDOMINAL HYSTERECTOMY    . CARDIAC CATHETERIZATION Left 04/13/2016   Procedure: Left Heart Cath and Coronary Angiography;  Surgeon: Laurier Nancy, MD;  Location: [facility] INVASIVE CV LAB;  Service: Cardiovascular;  Laterality: Left;  . JOINT REPLACEMENT       reports that she has quit smoking. Her smoking use included cigarettes. She has a 7.50 pack-year smoking history. She has never used smokeless tobacco. She reports that she does not drink alcohol or use drugs.  Allergies  Allergen Reactions  . Peanuts [Peanut Oil] Shortness Of Breath  . Lasix [Furosemide] Swelling    Tongue swelling  . Lisinopril Swelling    Tongue swelling    Family History  Problem Relation Age of Onset  . Stomach cancer Sister 83  . Vaginal cancer Sister 31  . Heart disease Father   . Breast cancer Neg Hx      Prior to Admission medications   Medication Sig Start Date End Date Taking? Authorizing Provider  Calcium Carbonate-Vitamin D3 (CALCIUM 600/VITAMIN D) 600-400 MG-UNIT TABS Take 1 tablet by mouth 2 (two) times daily.    [provider]  cetirizine (ZYRTEC) 10 MG tablet Take 10 mg by mouth daily.     [provider]  ferrous sulfate 325 (65 FE) MG tablet Take 1 tablet (325 mg total) by mouth 2 (two) times daily with a meal. 07/28/18  Gladstone Lighter, MD  fesoterodine (TOVIAZ) 8 MG TB24 tablet Take 8 mg by mouth daily.    [provider]  fluorometholone (FML) 0.1 % ophthalmic suspension Place 1 drop into the right eye 2 (two) times daily.    [provider]  fluticasone (FLONASE) 50 MCG/ACT nasal spray Place 2 sprays into both nostrils daily.    [provider]  furosemide (LASIX) 40 MG tablet Take 0.5 tablets (20 mg total) by mouth daily. 07/28/18 07/28/19  Gladstone Lighter, MD  hydrALAZINE (APRESOLINE) 25 MG tablet Take 25 mg by mouth 2 (two) times daily.     [provider]  HYDROcodone-acetaminophen (NORCO/VICODIN) 5-325 MG tablet Take 1 tablet by mouth every 6 (six) hours as needed for severe pain. 08/23/18   Delman Kitten, MD  ipratropium (ATROVENT) 0.03 % nasal spray Place 2 sprays into both nostrils 3 (three) times daily as needed for rhinitis.    [provider]  NOVOLOG FLEXPEN 100 UNIT/ML FlexPen Inject 0-10 Units into the skin 3 (three) times daily with meals. <60 Call MD 151-200 - 2 units 201-250 4 units 251-300 - 6 units 301-350 - 8 units 351-400 - 10 units >400 Call MD 05/23/18   [provider]  omeprazole (PRILOSEC) 40 MG capsule Take 1 capsule by mouth daily. 09/26/15   [provider]  potassium chloride (K-DUR,KLOR-CON) 10 MEQ tablet Take 10 mEq by mouth daily. 05/09/18   [provider]  prednisoLONE acetate (PRED FORTE) 1 % ophthalmic suspension Place 1 drop into both eyes 2 (two) times daily.     [provider]  rivaroxaban (XARELTO) 20 MG TABS tablet Take 1 tablet (20 mg total) by mouth daily. 07/28/18   Gladstone Lighter, MD  rosuvastatin (CRESTOR) 20 MG tablet Take 20 mg by mouth every evening.     [provider]  sertraline (ZOLOFT) 100 MG tablet Take 100 mg by mouth daily.    [provider]  sotalol (BETAPACE) 80 MG tablet Take 80 mg by mouth 2 (two) times daily.    [provider]  valACYclovir (VALTREX) 1000 MG tablet Take 1,000 mg by mouth daily.    [provider]    Physical Exam: Vitals:   01/18/20 1800 01/18/20 1815 01/18/20 1830 01/18/20 1845  BP: (!) 172/87 (!) 170/83 (!) 167/84 (!) 174/85  Pulse: 77 78 76 78  Resp: 20 18 (!) 21 (!) 22  Temp:      TempSrc:      SpO2: 95% 95% 95% 95%  Weight:      Height:          Constitutional: Acutely ill looking, frail, no obvious distress Vitals:   01/18/20 1800 01/18/20 1815 01/18/20 1830 01/18/20 1845  BP: (!) 172/87 (!) 170/83 (!) 167/84 (!) 174/85  Pulse: 77 78 76 78    Resp: 20 18 (!) 21 (!) 22  Temp:      TempSrc:      SpO2: 95% 95% 95% 95%  Weight:      Height:       Eyes: PERRL, lids and conjunctivae normal ENMT: Mucous membranes are dry. Posterior pharynx clear of any exudate or lesions.Normal dentition.  Neck: normal, supple, no masses, no thyromegaly Respiratory: clear to auscultation bilaterally, no wheezing, bilateral rhonchi no crackles. Normal respiratory effort. No accessory muscle use.  Cardiovascular: Regular rate and rhythm, no murmurs / rubs / gallops. No extremity edema. 2+ pedal pulses. No carotid bruits.  Abdomen: no tenderness, no masses palpated. No  hepatosplenomegaly. Bowel sounds positive.  Musculoskeletal: no clubbing / cyanosis. No joint deformity upper and lower extremities. Good ROM, no contractures. Normal muscle tone.  Skin: no rashes, lesions, ulcers. No induration, dry skin Neurologic: CN 2-12 grossly intact. Sensation intact, DTR normal. Strength 5/5 in all 4.  Psychiatric: Awake and alert with orientation 2 out of 3, mild drowsiness    Labs on Admission: I have personally reviewed following labs and imaging studies  CBC: Recent Labs  Lab 01/18/20 1607  WBC 8.9  HGB 16.7*  HCT 57.5*  MCV 100.9*  PLT 247   Basic Metabolic Panel: Recent Labs  Lab 01/18/20 1636  NA 157*  K 5.4*  CL 119*  CO2 25  GLUCOSE 171*  BUN 43*  CREATININE 1.27*  CALCIUM 9.9   GFR: Estimated Creatinine Clearance: 47.9 mL/min (A) (by C-G formula based on SCr of 1.27 mg/dL (H)). Liver Function Tests: No results for input(s): AST, ALT, ALKPHOS, BILITOT, PROT, ALBUMIN in the last 168 hours. No results for input(s): LIPASE, AMYLASE in the last 168 hours. No results for input(s): AMMONIA in the last 168 hours. Coagulation Profile: No results for input(s): INR, PROTIME in the last 168 hours. Cardiac Enzymes: No results for input(s): CKTOTAL, CKMB, CKMBINDEX, TROPONINI in the last 168 hours. BNP (last 3 results) No results for  input(s): PROBNP in the last 8760 hours. HbA1C: No results for input(s): HGBA1C in the last 72 hours. CBG: No results for input(s): GLUCAP in the last 168 hours. Lipid Profile: No results for input(s): CHOL, HDL, LDLCALC, TRIG, CHOLHDL, LDLDIRECT in the last 72 hours. Thyroid Function Tests: No results for input(s): TSH, T4TOTAL, FREET4, T3FREE, THYROIDAB in the last 72 hours. Anemia Panel: No results for input(s): VITAMINB12, FOLATE, FERRITIN, TIBC, IRON, RETICCTPCT in the last 72 hours. Urine analysis:    Component Value Date/Time   COLORURINE YELLOW (A) 11/06/2018 0717   APPEARANCEUR CLOUDY (A) 11/06/2018 0717   APPEARANCEUR Hazy 05/11/2013 0039   LABSPEC 1.018 11/06/2018 0717   LABSPEC 1.017 05/11/2013 0039   PHURINE 6.0 11/06/2018 0717   GLUCOSEU NEGATIVE 11/06/2018 0717   GLUCOSEU Negative 05/11/2013 0039   HGBUR NEGATIVE 11/06/2018 0717   BILIRUBINUR NEGATIVE 11/06/2018 0717   BILIRUBINUR Negative 05/11/2013 0039   KETONESUR NEGATIVE 11/06/2018 0717   PROTEINUR NEGATIVE 11/06/2018 0717   NITRITE NEGATIVE 11/06/2018 0717   LEUKOCYTESUR TRACE (A) 11/06/2018 0717   LEUKOCYTESUR Trace 05/11/2013 0039   Sepsis Labs: @LABRCNTIP (procalcitonin:4,lacticidven:4) )No results found for this or any previous visit (from the past 240 hour(s)).   Radiological Exams on Admission: DG Chest Portable 1 View  Result Date: 01/18/2020 CLINICAL DATA:  Weakness and COVID-19 positivity EXAM: PORTABLE CHEST 1 VIEW COMPARISON:  12/01/2019 FINDINGS: Cardiac shadow is stable. Aortic calcifications are again seen. The lungs are well aerated without focal infiltrate or sizable effusion. No bony abnormality is noted. IMPRESSION: No acute abnormality noted. Electronically Signed   By: 12/03/2019 M.D.   On: 01/18/2020 14:28    EKG: Independently reviewed.  It shows normal sinus rhythm with flipped T waves in the lateral leads.  No old EKG to compare  Assessment/Plan Principal Problem:    Hypernatremia Active Problems:   Chronic diastolic heart failure (HCC)   HTN (hypertension)   PAF (paroxysmal atrial fibrillation) (HCC)   Diabetes (HCC)   Hyperkalemia   COVID-19 virus infection     #1 hypernatremia: Most likely due to dehydration.  Patient has had poor oral intake.  She is therefore missing free water.  We will admit the patient.  She is a diabetic and will use half-normal saline or Ringer's lactate.  She has had boluses of fluids to expand her volume in the ER.  Monitor sodium level.  #2 recent COVID-19 infection: No respiratory symptoms.  This may have weakened patient to the point where she is having poor oral intake.  At this point we will monitor closely.  We will keep patient in this hospital rather than transfer since her COVID-19 infection is not symptomatic.  #3 diabetes: Sliding scale insulin and close monitoring  #4 hyperkalemia: Most likely as a result of the dehydration.  Hold any potassium supplementation or nephrotoxic medication.  Hydrate.  Use insulin may drop potassium.  If not will give Kayexalate  #5 paroxysmal atrial fibrillation: Stable at baseline.  Continue to monitor  #6 hypertension: Blood pressure is elevated.  Continue to monitor and use home regimen   DVT prophylaxis: Xarelto Code Status: Full code Family Communication: No family at bedside Disposition Plan: Back to skilled facility Consults called: None Admission status: Inpatient  Severity of Illness: The appropriate patient status for this patient is INPATIENT. Inpatient status is judged to be reasonable and necessary in order to provide the required intensity of service to ensure the patient's safety. The patient's presenting symptoms, physical exam findings, and initial radiographic and laboratory data in the context of their chronic comorbidities is felt to place them at high risk for further clinical deterioration. Furthermore, it is not anticipated that the patient will be  medically stable for discharge from the hospital within 2 midnights of admission. The following factors support the patient status of inpatient.   " The patient's presenting symptoms include weakness. " The worrisome physical exam findings include frail with confusion. " The initial radiographic and laboratory data are worrisome because of sodium 157. " The chronic co-morbidities include CHF with diabetes.   * I certify that at the point of admission it is my clinical judgment that the patient will require inpatient hospital care spanning beyond 2 midnights from the point of admission due to high intensity of service, high risk for further deterioration and high frequency of surveillance required.Lonia Blood MD [facility] Pager [phone]  If 7PM-7AM, please contact night-coverage [email or web address] Password [facility]  01/18/2020, 7:24 PM

## 2020-01-18 NOTE — ED Notes (Signed)
Pt brief changed, pt clean/dry at this time.

## 2020-01-18 NOTE — ED Provider Notes (Signed)
[facility] Emergency Department Provider Note   ____________________________________________   First MD Initiated Contact with Patient 01/18/20 1323     (approximate)  I have reviewed the triage vital signs and the nursing notes.   HISTORY  Chief Complaint Weakness and Urinary Tract Infection  EM caveat: Lethargy, poor historian, altered mental status  HPI Virginia Barnes is a 74 y.o. female here for evaluation of weakness and dehydration  Patient unable to provide history.  She reports she feels "fine" but cannot be more specific.  Unable to conduct conversation   Past Medical History:  Diagnosis Date  . Arrhythmia    atrial fibrillation  . CHF (congestive heart failure) ([place])   . Diabetes mellitus without complication ([place])   . Diverticulitis   . Frequent falls   . Hypertension     Patient Active Problem List   Diagnosis Date Noted  . Hematoma 07/25/2018  . Frequent falls 07/15/2018  . Imbalance 07/15/2018  . 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 Chronic diastolic heart failure ([place]) 02/17/2018  . HTN (hypertension) 02/17/2018  . PAF (paroxysmal atrial fibrillation) ([place]) 02/17/2018  . Lymphedema 02/17/2018  . Diabetes ([place]) 02/17/2018  . Sepsis ([place]) 01/30/2018  . Hypoglycemia 01/12/2018  . Knee pain 05/17/2017    Past Surgical History:  Procedure Laterality Date  . ABDOMINAL HYSTERECTOMY    . CARDIAC CATHETERIZATION Left 04/13/2016   Procedure: Left Heart Cath and Coronary Angiography;  Surgeon: Dionisio David, MD;  Location: [place] CV LAB;  Service: Cardiovascular;  Laterality: Left;  . JOINT REPLACEMENT      Prior to Admission medications   Medication Sig Start Date End Date Taking? Authorizing Provider  Calcium Carbonate-Vitamin D3 (CALCIUM 600/VITAMIN D) 600-400 MG-UNIT TABS Take 1 tablet by mouth 2 (two) times daily.    [provider]  cetirizine (ZYRTEC) 10 MG tablet Take 10 mg by mouth daily.      [provider]  ferrous sulfate 325 (65 FE) MG tablet Take 1 tablet (325 mg total) by mouth 2 (two) times daily with a meal. 07/28/18   Gladstone Lighter, MD  fesoterodine (TOVIAZ) 8 MG TB24 tablet Take 8 mg by mouth daily.    [provider]  fluorometholone (FML) 0.1 % ophthalmic suspension Place 1 drop into the right eye 2 (two) times daily.    [provider]  fluticasone (FLONASE) 50 MCG/ACT nasal spray Place 2 sprays into both nostrils daily.    [provider]  furosemide (LASIX) 40 MG tablet Take 0.5 tablets (20 mg total) by mouth daily. 07/28/18 07/28/19  Gladstone Lighter, MD  hydrALAZINE (APRESOLINE) 25 MG tablet Take 25 mg by mouth 2 (two) times daily.    [provider]  HYDROcodone-acetaminophen (NORCO/VICODIN) 5-325 MG tablet Take 1 tablet by mouth every 6 (six) hours as needed for severe pain. 08/23/18   Delman Kitten, MD  ipratropium (ATROVENT) 0.03 % nasal spray Place 2 sprays into both nostrils 3 (three) times daily as needed for rhinitis.    [provider]  NOVOLOG FLEXPEN 100 UNIT/ML FlexPen Inject 0-10 Units into the skin 3 (three) times daily with meals. <60 Call MD 151-200 - 2 units 201-250 4 units 251-300 - 6 units 301-350 - 8 units 351-400 - 10 units >400 Call MD 05/23/18   [provider]  omeprazole (PRILOSEC) 40 MG capsule Take 1 capsule by mouth daily. 09/26/15   [provider]  potassium chloride (K-DUR,KLOR-CON) 10 MEQ tablet Take 10  mEq by mouth daily. 05/09/18   [provider]  prednisoLONE acetate (PRED FORTE) 1 % ophthalmic suspension Place 1 drop into both eyes 2 (two) times daily.     [provider]  rivaroxaban (XARELTO) 20 MG TABS tablet Take 1 tablet (20 mg total) by mouth daily. 07/28/18   Enid Baas, MD  rosuvastatin (CRESTOR) 20 MG tablet Take 20 mg by mouth every evening.     [provider]  sertraline (ZOLOFT) 100 MG tablet Take 100 mg by mouth  daily.    [provider]  sotalol (BETAPACE) 80 MG tablet Take 80 mg by mouth 2 (two) times daily.    [provider]  valACYclovir (VALTREX) 1000 MG tablet Take 1,000 mg by mouth daily.    [provider]    Allergies Peanuts [peanut oil], Lasix [furosemide], and Lisinopril  Family History  Problem Relation Age of Onset  . Stomach cancer Sister 56  . Vaginal cancer Sister 31  . Heart disease Father   . Breast cancer Neg Hx     Social History Social History   Tobacco Use  . Smoking status: Former Smoker    Packs/day: 0.50    Years: 15.00    Pack years: 7.50    Types: Cigarettes  . Smokeless tobacco: Never Used  Substance Use Topics  . Alcohol use: No  . Drug use: No    Review of Systems EM caveat   ____________________________________________   PHYSICAL EXAM:  VITAL SIGNS: ED Triage Vitals  Enc Vitals Group     BP 01/18/20 1315 (!) 151/89     Pulse Rate 01/18/20 1315 78     Resp 01/18/20 1315 18     Temp 01/18/20 1315 98.9 F (37.2 C)     Temp Source 01/18/20 1315 Oral     SpO2 01/18/20 1311 95 %     Weight 01/18/20 1315 211 lb 10.3 oz (96 kg)     Height 01/18/20 1315 5\' 9"  (1.753 m)     Head Circumference --      Peak Flow --      Pain Score 01/18/20 1315 0     Pain Loc --      Pain Edu? --      Excl. in GC? --     Constitutional: Alert to voice but fairly lethargic, very fatigued appearing disheveled somewhat chronically ill-appearing female Eyes: Conjunctivae are slightly injected bilateral. Head: Atraumatic. Nose: No congestion/rhinnorhea. Mouth/Throat: Mucous membranes are very dry. Neck: No stridor.  Cardiovascular: Normal rate, regular rhythm. Grossly normal heart sounds.  Good peripheral circulation. Respiratory: Normal respiratory effort.  No retractions. Lungs CTAB. Gastrointestinal: Soft and nontender. No distention. Musculoskeletal: No lower extremity tenderness nor edema. Neurologic: Patient is very  fatigued.  Somewhat lethargic.  Will follow commands such as moving her arms and legs, but with diminished strength about 4-5 all extremities.  She appears overall generally very fatigued.  Equal smile, but again fatigue. Skin:  Skin is warm, dry and intact. No rash noted.  Poor skin turgor. Psychiatric: Mood and affect are fairly sedate ____________________________________________   LABS (all labs ordered are listed, but only abnormal results are displayed)  Labs Reviewed  CBC - Abnormal; Notable for the following components:      Result Value   RBC 5.70 (*)    Hemoglobin 16.7 (*)    HCT 57.5 (*)    MCV 100.9 (*)    MCHC 29.0 (*)    All other components within  normal limits  BASIC METABOLIC PANEL - Abnormal; Notable for the following components:   Sodium 157 (*)    Potassium 5.4 (*)    Chloride 119 (*)    Glucose, Bld 171 (*)    BUN 43 (*)    Creatinine, Ser 1.27 (*)    GFR calc non Af Amer 42 (*)    GFR calc Af Amer 48 (*)    All other components within normal limits  URINALYSIS, COMPLETE (UACMP) WITH MICROSCOPIC   ____________________________________________  EKG  Reviewed inter by me at 1430 Heart rate 80 QRs 90 QTc 440 Normal sinus rhythm, T wave inversions lead III aVF.  No STEMI ____________________________________________  RADIOLOGY  DG Chest Portable 1 View  Result Date: 01/18/2020 CLINICAL DATA:  Weakness and COVID-19 positivity EXAM: PORTABLE CHEST 1 VIEW COMPARISON:  12/01/2019 FINDINGS: Cardiac shadow is stable. Aortic calcifications are again seen. The lungs are well aerated without focal infiltrate or sizable effusion. No bony abnormality is noted. IMPRESSION: No acute abnormality noted. Electronically Signed   By: Alcide Clever M.D.   On: 01/18/2020 14:28    Chest x-ray reviewed negative for acute ____________________________________________   PROCEDURES  Procedure(s) performed: None  Procedures  Critical Care performed:  No  ____________________________________________   INITIAL IMPRESSION / ASSESSMENT AND PLAN / ED COURSE  Pertinent labs & imaging results that were available during my care of the patient were reviewed by me and considered in my medical decision making (see chart for details).   Patient is for evaluation for possible dehydration fatigue.  She appears very dehydrated by clinical exam very hypovolemic but normotensive and her vital signs are reassuring.  Not hypoxic her work of breathing is normal.  She is known Covid 19+2 weeks ago, and given her ongoing fatigue and weakness I would consider she is likely still infectious and actively infected with COVID-19.  We will start to rehydrate her.  Evaluate lab work, no focal neurologic symptoms.  No evidence of injury or trauma.  She has no complaint but is very fatigued somewhat lethargic, very weak in appearance.  Clinical Course as of Jan 17 1839  Mon Jan 18, 2020  1337 Called Katryna Tschirhart (listed contact). Family reports she had definitely worsened and normally she is conversant and alert. Does have history of some confusion over last several months.    [MQ]  8341 Admission discussed with hospitalist Dr. Mikeal Hawthorne   [MQ]    Clinical Course User Index [MQ] Sharyn Creamer, MD   Patient will be admitted for dehydration, hypernatremia.  Continue to hydrate her and hospitalist team admitting.  ____________________________________________   FINAL CLINICAL IMPRESSION(S) / ED DIAGNOSES  Final diagnoses:  Hypernatremia  Dehydration  COVID-19  COVID-19 infection      Note:  This document was prepared using Dragon voice recognition software and may include unintentional dictation errors       Sharyn Creamer, MD 01/18/20 1840

## 2020-01-18 NOTE — ED Triage Notes (Signed)
Pt arrival via [facility] from [facility] due to 'low O2 sats'. Upon arrival, patient was satting at 98% on RA with EMS. [facility] wanted patient to come to be assessed for a possible UTI and they want her to have fluids due to not eating since Saturday.   Pt is covid positive and states feeling weak at this time. Pt has baseline confusion.  EMS vitals BP- 176/70 HR- 74 BS- 232  EMS states they don't believe she received any of her meds this morning.

## 2020-01-19 DIAGNOSIS — E860 Dehydration: Secondary | ICD-10-CM

## 2020-01-19 DIAGNOSIS — E875 Hyperkalemia: Secondary | ICD-10-CM

## 2020-01-19 DIAGNOSIS — I5032 Chronic diastolic (congestive) heart failure: Secondary | ICD-10-CM

## 2020-01-19 DIAGNOSIS — I480 Paroxysmal atrial fibrillation: Secondary | ICD-10-CM

## 2020-01-19 DIAGNOSIS — I10 Essential (primary) hypertension: Secondary | ICD-10-CM

## 2020-01-19 LAB — URINALYSIS, COMPLETE (UACMP) WITH MICROSCOPIC
Bilirubin Urine: NEGATIVE
Glucose, UA: NEGATIVE mg/dL
Ketones, ur: NEGATIVE mg/dL
Nitrite: NEGATIVE
Protein, ur: NEGATIVE mg/dL
RBC / HPF: >50 RBC/hpf — ABNORMAL HIGH (ref 0–5)
Specific Gravity, Urine: 1.021 (ref 1.005–1.030)
pH: 5 (ref 5.0–8.0)

## 2020-01-19 LAB — BASIC METABOLIC PANEL
Anion gap: 11 (ref 5–15)
BUN: 43 mg/dL — ABNORMAL HIGH (ref 8–23)
CO2: 26 mmol/L (ref 22–32)
Calcium: 9.6 mg/dL (ref 8.9–10.3)
Chloride: 122 mmol/L — ABNORMAL HIGH (ref 98–111)
Creatinine, Ser: 1.1 mg/dL — ABNORMAL HIGH (ref 0.44–1.00)
GFR calc Af Amer: 57 mL/min — ABNORMAL LOW (ref >60)
GFR calc non Af Amer: 49 mL/min — ABNORMAL LOW (ref >60)
Glucose, Bld: 214 mg/dL — ABNORMAL HIGH (ref 70–99)
Potassium: 4.3 mmol/L (ref 3.5–5.1)
Sodium: 159 mmol/L — ABNORMAL HIGH (ref 135–145)

## 2020-01-19 LAB — HEMOGLOBIN A1C
Hgb A1c MFr Bld: 7.5 % — ABNORMAL HIGH (ref 4.8–5.6)
Mean Plasma Glucose: 168.55 mg/dL

## 2020-01-19 LAB — CBC
HCT: 60.8 % — ABNORMAL HIGH (ref 36.0–46.0)
Hemoglobin: 17.4 g/dL — ABNORMAL HIGH (ref 12.0–15.0)
MCH: 28.7 pg (ref 26.0–34.0)
MCHC: 28.6 g/dL — ABNORMAL LOW (ref 30.0–36.0)
MCV: 100.3 fL — ABNORMAL HIGH (ref 80.0–100.0)
Platelets: 219 10*3/uL (ref 150–400)
RBC: 6.06 MIL/uL — ABNORMAL HIGH (ref 3.87–5.11)
RDW: 13.4 % (ref 11.5–15.5)
WBC: 9 10*3/uL (ref 4.0–10.5)
nRBC: 0 % (ref 0.0–0.2)

## 2020-01-19 LAB — GLUCOSE, CAPILLARY
Glucose-Capillary: 146 mg/dL — ABNORMAL HIGH (ref 70–99)
Glucose-Capillary: 121 mg/dL — ABNORMAL HIGH (ref 70–99)
Glucose-Capillary: 197 mg/dL — ABNORMAL HIGH (ref 70–99)
Glucose-Capillary: 136 mg/dL — ABNORMAL HIGH (ref 70–99)

## 2020-01-19 MED ORDER — FESOTERODINE FUMARATE ER 8 MG PO TB24
8.0000 mg | ORAL_TABLET | Freq: Every day | ORAL | Status: DC
  Filled 2020-01-19 (×3): qty 1

## 2020-01-19 MED ORDER — NEPRO/CARBSTEADY PO LIQD
237.0000 mL | Freq: Three times a day (TID) | ORAL | Status: DC
  Administered 2020-01-19 – 2020-01-20 (×4): 237 mL via ORAL

## 2020-01-19 MED ORDER — DEXTROSE 5 % IV SOLN: INTRAVENOUS | Status: DC

## 2020-01-19 MED ORDER — IPRATROPIUM BROMIDE 0.03 % NA SOLN
2.0000 | Freq: Four times a day (QID) | NASAL | Status: DC | PRN
  Filled 2020-01-19: qty 30

## 2020-01-19 MED ORDER — HYDRALAZINE HCL 50 MG PO TABS
25.0000 mg | ORAL_TABLET | Freq: Two times a day (BID) | ORAL | Status: DC
  Administered 2020-01-19 – 2020-01-20 (×2): 25 mg via ORAL
  Filled 2020-01-19 (×4): qty 1

## 2020-01-19 MED ORDER — ASCORBIC ACID 500 MG PO TABS
250.0000 mg | ORAL_TABLET | Freq: Two times a day (BID) | ORAL | Status: DC
  Administered 2020-01-19 – 2020-01-20 (×2): 250 mg via ORAL
  Filled 2020-01-19 (×2): qty 1

## 2020-01-19 MED ORDER — HYDRALAZINE HCL 20 MG/ML IJ SOLN: 10.0000 mg | Freq: Four times a day (QID) | INTRAMUSCULAR | Status: DC | PRN

## 2020-01-19 MED ORDER — ADULT MULTIVITAMIN W/MINERALS CH: 1.0000 | ORAL_TABLET | Freq: Every day | ORAL | Status: DC

## 2020-01-19 MED ORDER — SODIUM CHLORIDE 0.9 % IV SOLN
1.0000 g | INTRAVENOUS | Status: DC
  Administered 2020-01-19 – 2020-01-21 (×3): 1 g via INTRAVENOUS
  Filled 2020-01-19 (×2): qty 1
  Filled 2020-01-19: qty 10
  Filled 2020-01-19: qty 1

## 2020-01-19 NOTE — Progress Notes (Addendum)
[facility]  -  at [facility]   PATIENT NAME: Virginia Barnes    MR#:  [id number]  DATE OF BIRTH:  November 23, 1946  SUBJECTIVE:   Patient has baseline dementia unable to get any history review of systems. Does not hold meaningful conversation. Took some meds from nurse earlier. Does well with liquid diet. REVIEW OF SYSTEMS:   Review of Systems  Unable to perform ROS: Dementia   Tolerating Diet: Tolerating PT:   DRUG ALLERGIES:   Allergies  Allergen Reactions  . Peanuts [Peanut Oil] Shortness Of Breath  . Lasix [Furosemide] Swelling    Tongue swelling  . Lisinopril Swelling    Tongue swelling    VITALS:  Blood pressure (!) 144/66, pulse 74, temperature 97.7 F (36.5 C), temperature source Axillary, resp. rate 17, height 5\' 9"  (1.753 m), weight 96 kg, SpO2 97 %.  PHYSICAL EXAMINATION:   Physical Exam admitted exam  GENERAL:  74 y.o.-year-old patient lying in the bed with no acute distress.  EYES: Pupils equal, round, reactive to light and accommodation. No scleral icterus.   HEENT: Head atraumatic, normocephalic. Oropharynx and nasopharynx clear.  Oral mucosa LUNGS: Normal breath sounds bilaterally, no wheezing, rales, rhonchi. No use of accessory muscles of respiration.  CARDIOVASCULAR: S1, S2 normal. No murmurs, rubs, or gallops.  ABDOMEN: Soft, nontender, nondistended. Bowel sounds present. No organomegaly or mass.  EXTREMITIES: No cyanosis, clubbing or edema b/l.    NEUROLOGIC: all extremities well.  PSYCHIATRIC:  patient is alert however has dementia at baseline nonverbal at present SKIN: No obvious rash, lesion, or ulcer.   LABORATORY PANEL:  CBC Recent Labs  Lab 01/19/20 0648  WBC 9.0  HGB 17.4*  HCT 60.8*  PLT 219    Chemistries  Recent Labs  Lab 01/18/20 1636  NA 157*  K 5.4*  CL 119*  CO2 25  GLUCOSE 171*  BUN 43*  CREATININE 1.27*  CALCIUM 9.9   Cardiac Enzymes No results for input(s): TROPONINI in the last 168  hours. RADIOLOGY:  DG Chest Portable 1 View  Result Date: 01/18/2020 CLINICAL DATA:  Weakness and COVID-19 positivity EXAM: PORTABLE CHEST 1 VIEW COMPARISON:  12/01/2019 FINDINGS: Cardiac shadow is stable. Aortic calcifications are again seen. The lungs are well aerated without focal infiltrate or sizable effusion. No bony abnormality is noted. IMPRESSION: No acute abnormality noted. Electronically Signed   By: 12/03/2019 M.D.   On: 01/18/2020 14:28   ASSESSMENT AND PLAN:   Virginia Barnes is a 74 y.o. female with medical history significant of recent diagnosis of COVID-19 pneumonia about 2 weeks ago, diastolic function CHF, diabetes, hypertension, frequent falls, suspected UTI who was brought in from [place] [facility] due to decreased oral intake and functioning since her diagnosis of COVID-19 about 2 weeks ago.  She has had no more fever or chills she has had no significant symptoms of the Covid but patient has gotten weak and weak and dehydrated.  #1 hypernatremia/Acute renal failure : Most likely due to dehydration -Patient has had poor oral intake.    -She is a diabetic and will use  Ringer's lactate.  She has had boluses of fluids to expand her volume in the ER.  Monitor sodium level. --H/H elevated due to hemoconcentration/dehydraiton -came in with Na 157 -BMP today  #2 recent COVID-19 infection: No respiratory symptoms.  This may have weakened patient to the point where she is having poor oral intake.     #3 diabetes: Sliding scale insulin and close  monitoring   #4 hyperkalemia: Most likely as a result of the dehydration.   -Hold any potassium supplementation or nephrotoxic medication.   -cont IVF  #5 paroxysmal atrial fibrillation: Stable at baseline.  Continue to monitor -on sotalol -on Xarelto for anticoagulation  #6 hypertension: Blood pressure is elevated.   -resume hydralazine increase dose according to blood pressure  #7 UTI --pt unable to elicit any symptoms given  presentation and dark urine with abnormal UA patient on IV antibiotics -change to oral at discharge  Physical therapy to see patient for ambulation transition of care-- for discharge planning   DVT prophylaxis: Xarelto Code Status: Full code Family Communication: No family at bedside Disposition Plan: Back to ALF Consults called: None Admission status: Inpatient Barriers to discharge: none so far  TOTAL TIME TAKING CARE OF THIS PATIENT: 30 minutes.  >50% time spent on counselling and coordination of care  Note: This dictation was prepared with Dragon dictation along with smaller phrase technology. Any transcriptional errors that result from this process are unintentional.  Fritzi Mandes M.D    [facility]   CC: Primary care physician; Perrin Maltese, MDPatient ID: Virginia Barnes, female   DOB: [birth year], 74 y.o.   MRN: [id number]

## 2020-01-19 NOTE — Progress Notes (Signed)
Bedside swallow eval to follow upon completion. Pt tolerated several sips of thin by straw without s/s of aspiration. Oral transit delay and oral residue with purees secondary to lethargy. Will order dysphagia 1 with thin liquids. meds crushed in applesauce. Pos likely to be poor unless alertness improves. Discussed with Nsg and MD. Also rec dietitian consult. Main source of nutrition is likely to be through liquids at this time.

## 2020-01-19 NOTE — Progress Notes (Addendum)
Informed by RN patent urinalysis with leukocytes,negative for  Nitrites, RBC's and bacteria. Patient started on empiric rocephen.  NO MDRO or ESBL pathogens noted in her past UTI's. Could be asymptomatic bacteruria but given presentation, suspect acute infection. Started empirically on rocephin.

## 2020-01-19 NOTE — Progress Notes (Signed)
Initial Nutrition Assessment  DOCUMENTATION CODES:   Obesity unspecified  INTERVENTION:   Nepro Shake po TID, each supplement provides 425 kcal and 19 grams protein  MVI daily  Vitamin C 250mg  po BID   NUTRITION DIAGNOSIS:   Increased nutrient needs related to catabolic illness(COVID 19) as evidenced by increased estimated needs.  GOAL:   Patient will meet greater than or equal to 90% of their needs  MONITOR:   PO intake, Supplement acceptance, Labs, Weight trends, Skin, I & O's  REASON FOR ASSESSMENT:   Consult Assessment of nutrition requirement/status  ASSESSMENT:   74 y.o. female with medical history significant of recent diagnosis of COVID-19 pneumonia about 2 weeks ago, diastolic function CHF, diabetes, hypertension, frequent falls, suspected UTI who was brought in from [place] [facility] due to decreased oral intake and functioning since her diagnosis of COVID-19.   Unable to speak with pt r/t confusion. Per chart review, pt with poor appetite and oral intake for 3 days pta. Pt seen by SLP and placed on a dysphagia 1/thin liquid diet today. RD will add supplements and vitamins to help pt meet her estimated needs. RD will add Nepro in setting of AKI but can change to Ensure if needed. RD will also add vitamin C to support wound healing. Per chart, pt appears weight stable at baseline; however, admit weight appears stated and not measured.   Medications reviewed and include: oscal w/ D, ferrous sulfate, insulin, protonix, ceftriaxone, LRS @125ml /hr  Labs reviewed: Na 157(H), K 5.4(H), BUN 43(H), creat 1.27(H) cbgs- 146, 121 x 24 hrs AIC 7.5(H)- 2/15  Unable to complete Nutrition-Focused physical exam at this time as pt with COVID 19.   Diet Order:   Diet Order            DIET - DYS 1 Room service appropriate? Yes; Fluid consistency: Thin  Diet effective now             EDUCATION NEEDS:   Not appropriate for education at this time  Skin:  Skin Assessment:  Reviewed RN Assessment(Stage II buttocks)  Last BM:  PTA  Height:   Ht Readings from Last 1 Encounters:  01/18/20 5\' 9"  (1.753 m)    Weight:   Wt Readings from Last 1 Encounters:  01/18/20 96 kg    Ideal Body Weight:  65.9 kg  BMI:  Body mass index is 31.25 kg/m.  Estimated Nutritional Needs:   Kcal:  2000-2300kcal/day  Protein:  100-115g/day  Fluid:  >1.7L/day  01/20/20 MS, RD, LDN Contact information available in Amion

## 2020-01-19 NOTE — Progress Notes (Signed)
UA/Microscopic UA resulted. Positive for Bacteria/Leukocytes & RBC. Urine collected was Amber/Cloudy, & malodorous. Notified B. Jon Billings, NP

## 2020-01-19 NOTE — Progress Notes (Addendum)
Paged B. Jon Billings, NP to notify of BP 178/77. Scheduled dose of sotalol 80mg  given. , NP requested to be updated of BP with recheck. Notified @0215  of 169/68 with recheck. Jon Billings, NP ordered hydralazine PRN if SBP>180.

## 2020-01-19 NOTE — Evaluation (Signed)
Clinical/Bedside Swallow Evaluation Patient Details  Name: Virginia Barnes MRN: [id number] Date of Birth: Jun 01, 1946  Today's Date: 01/19/2020 Time: SLP Start Time (ACUTE ONLY): 1212 SLP Stop Time (ACUTE ONLY): 1300 SLP Time Calculation (min) (ACUTE ONLY): 48 min  Past Medical History:  Past Medical History:  Diagnosis Date  . Arrhythmia    atrial fibrillation  . CHF (congestive heart failure) (HCC)   . Diabetes mellitus without complication (HCC)   . Diverticulitis   . Frequent falls   . Hypertension    Past Surgical History:  Past Surgical History:  Procedure Laterality Date  . ABDOMINAL HYSTERECTOMY    . CARDIAC CATHETERIZATION Left 04/13/2016   Procedure: Left Heart Cath and Coronary Angiography;  Surgeon: Laurier Nancy, MD;  Location: [facility] INVASIVE CV LAB;  Service: Cardiovascular;  Laterality: Left;  . JOINT REPLACEMENT     HPI: per history and Physical:    "Virginia Barnes is a 74 y.o. female with medical history significant of recent diagnosis of COVID-19 pneumonia about 2 weeks ago, diastolic function CHF, diabetes, hypertension, frequent falls, suspected UTI who was brought in from [place] [facility] due to decreased oral intake and functioning since her diagnosis of COVID-19 about 2 weeks ago.  She has had no more fever or chills she has had no significant symptoms of the Covid but patient has gotten weak and weak and dehydrated.  She was suspected to have UTI from there and was sent to the ER for evaluation.  In the ER patient was having oxygen sat of 98% on room air with no respiratory symptoms.  Lateral abnormalities was however noted.  Patient also noted to have failure to thrive probably acid reaction to her recent COVID-19 diagnosis.  She is hypertensive with systolic of 176 on arrival and blood sugar is up to 200.  At this point she is being admitted for supportive care and failure to thrive with significant hypotension and hypernatremia".    Assessment / Plan /  Recommendation Clinical Impression  Pt was lethargic throughout bedside swallow eval today and needed continuous stimulation to stay awake. Pt tested COVID positive 2 weeks ago and is currently in a COVID room. Because of the COVID ventilation system and low speech volume, it was difficult to hear Pt's voice. Max cues needed to get Pt to open her mouth wide enough for a spoon with applesauce. Pt. was eventually able take in a half tsp bite x2. Oral transit delay with the applesauce, mild oral residue after the swallow which Pt cleared with sips of water by straw. No s/s of aspiration with the thin liquids by straw or the applesauce boluses. Rec Dysphagia 1 diet with thin liquids. PO intake is likely to continue to be poor unless level of alertness improves significantly. Majority of nutrition will likely be obtained via liquids. Dietitian consult to assess and make recommendations for liquid supplements. Consider alternative method of nutrition if PO intake does not improve. Prognosis for toleration of diet is good. ST to follow and adjust diet as appropriate. SLP Visit Diagnosis: Dysphagia, oropharyngeal phase (R13.12)    Aspiration Risk  Mild aspiration risk;Moderate aspiration risk    Diet Recommendation Dysphagia 1 (Puree);Thin liquid   Liquid Administration via: Straw Medication Administration: Crushed with puree Supervision: Full supervision/cueing for compensatory strategies Compensations: Small sips/bites;Minimize environmental distractions;Other (Comment)(alternate liquids with purees) Postural Changes: Seated upright at 90 degrees;Remain upright for at least 30 minutes after po intake    Other  Recommendations Oral Care Recommendations: Oral  care BID   Follow up Recommendations Skilled Nursing facility      Frequency and Duration min 2x/week  1 week       Prognosis Prognosis for Safe Diet Advancement: Fair Barriers to Reach Goals: Other (Comment)(lethragy, deconditioning)       Swallow Study   General Date of Onset: 01/18/20 Type of Study: Bedside Swallow Evaluation Diet Prior to this Study: NPO Temperature Spikes Noted: No Respiratory Status: Room air History of Recent Intubation: No Behavior/Cognition: Lethargic/Drowsy Oral Cavity Assessment: Other (comment)(Difficult to assess) Oral Care Completed by SLP: No Self-Feeding Abilities: Total assist Patient Positioning: Upright in bed Baseline Vocal Quality: Low vocal intensity    Oral/Motor/Sensory Function     Ice Chips Ice chips: Impaired(Extended time to swallow)   Thin Liquid Thin Liquid: Within functional limits Presentation: Straw    Nectar Thick Nectar Thick Liquid: Not tested   Honey Thick Honey Thick Liquid: Not tested   Puree Puree: Impaired Presentation: Spoon Oral Phase Impairments: Poor awareness of bolus;Reduced lingual movement/coordination Oral Phase Functional Implications: Prolonged oral transit;Oral residue   Solid     Solid: Not tested      Lucila Maine 01/19/2020,1:13 PM

## 2020-01-19 NOTE — Plan of Care (Signed)
  Problem: Education: Goal: Knowledge of General Education information will improve Description: Including pain rating scale, medication(s)/side effects and non-pharmacologic comfort measures Outcome: Progressing   Problem: Health Behavior/Discharge Planning: Goal: Ability to manage health-related needs will improve Outcome: Progressing   Problem: Clinical Measurements: Goal: Ability to maintain clinical measurements within normal limits will improve Outcome: Progressing Goal: Will remain free from infection Outcome: Progressing Goal: Diagnostic test results will improve Outcome: Progressing Goal: Respiratory complications will improve Outcome: Progressing Goal: Cardiovascular complication will be avoided Outcome: Progressing   Problem: Activity: Goal: Risk for activity intolerance will decrease Outcome: Progressing   Problem: Nutrition: Goal: Adequate nutrition will be maintained Outcome: Progressing   Problem: Coping: Goal: Level of anxiety will decrease Outcome: Progressing   Problem: Elimination: Goal: Will not experience complications related to bowel motility Outcome: Progressing Goal: Will not experience complications related to urinary retention Outcome: Progressing   Problem: Pain Managment: Goal: General experience of comfort will improve Outcome: Progressing   Problem: Safety: Goal: Ability to remain free from injury will improve Outcome: Progressing   Problem: Skin Integrity: Goal: Risk for impaired skin integrity will decrease Outcome: Progressing   Problem: Education: Goal: Ability to verbalize understanding of causes of urinary incontinence will improve Outcome: Progressing Goal: Knowledge of measures to decrease incidence of urinary incontinence will improve Outcome: Progressing   Problem: Coping: Goal: Ability to verbalize feelings about incontinence will improve Outcome: Progressing   Problem: Urinary Elimination: Goal: Pelvic muscle  control will improve Outcome: Progressing   Problem: Skin Integrity: Goal: Risk for impaired skin integrity will decrease Outcome: Progressing   Problem: Urinary Elimination: Goal: Signs and symptoms of infection will decrease Outcome: Progressing   Problem: Education: Goal: Knowledge of risk factors and measures for prevention of condition will improve Outcome: Progressing   Problem: Coping: Goal: Psychosocial and spiritual needs will be supported Outcome: Progressing   Problem: Respiratory: Goal: Will maintain a patent airway Outcome: Progressing Goal: Complications related to the disease process, condition or treatment will be avoided or minimized Outcome: Progressing

## 2020-01-20 DIAGNOSIS — U071 COVID-19: Secondary | ICD-10-CM

## 2020-01-20 LAB — GLUCOSE, CAPILLARY
Glucose-Capillary: 222 mg/dL — ABNORMAL HIGH (ref 70–99)
Glucose-Capillary: 258 mg/dL — ABNORMAL HIGH (ref 70–99)
Glucose-Capillary: 248 mg/dL — ABNORMAL HIGH (ref 70–99)
Glucose-Capillary: 226 mg/dL — ABNORMAL HIGH (ref 70–99)
Glucose-Capillary: 187 mg/dL — ABNORMAL HIGH (ref 70–99)
Glucose-Capillary: 195 mg/dL — ABNORMAL HIGH (ref 70–99)

## 2020-01-20 LAB — BASIC METABOLIC PANEL
Anion gap: 7 (ref 5–15)
BUN: 39 mg/dL — ABNORMAL HIGH (ref 8–23)
CO2: 29 mmol/L (ref 22–32)
Calcium: 9.3 mg/dL (ref 8.9–10.3)
Chloride: 118 mmol/L — ABNORMAL HIGH (ref 98–111)
Creatinine, Ser: 1.11 mg/dL — ABNORMAL HIGH (ref 0.44–1.00)
GFR calc Af Amer: 57 mL/min — ABNORMAL LOW (ref >60)
GFR calc non Af Amer: 49 mL/min — ABNORMAL LOW (ref >60)
Glucose, Bld: 281 mg/dL — ABNORMAL HIGH (ref 70–99)
Potassium: 3.8 mmol/L (ref 3.5–5.1)
Sodium: 154 mmol/L — ABNORMAL HIGH (ref 135–145)

## 2020-01-20 MED ORDER — INSULIN DETEMIR 100 UNIT/ML ~~LOC~~ SOLN
8.0000 [IU] | Freq: Every day | SUBCUTANEOUS | Status: DC
  Administered 2020-01-21: 12:00:00 8 [IU] via SUBCUTANEOUS
  Filled 2020-01-20: qty 1

## 2020-01-20 NOTE — Progress Notes (Signed)
[facility] [place] NAME: Virginia Barnes    MR#:  [id number]  DATE OF BIRTH:  December 24, 1945  SUBJECTIVE:  Patient has baseline dementia unable to get any history review of systems. Does not hold meaningful conversation. Took some meds from nurse earlier. Does well with liquid diet. REVIEW OF SYSTEMS:   Review of Systems  Unable to perform ROS: Dementia   Tolerating Diet: Some for nursing DRUG ALLERGIES:   Allergies  Allergen Reactions  . Peanuts [Peanut Oil] Shortness Of Breath  . Lasix [Furosemide] Swelling    Tongue swelling  . Lisinopril Swelling    Tongue swelling    VITALS:  Blood pressure (!) 142/109, pulse 73, temperature 97.9 F (36.6 C), temperature source Oral, resp. rate 16, height 5\' 9"  (1.753 m), weight 96 kg, SpO2 99 %. PHYSICAL EXAMINATION:   Physical Exam admitted exam  GENERAL:  74 y.o.-year-old patient lying in the bed with no acute distress.  EYES: Pupils equal, round, reactive to light and accommodation. No scleral icterus.   HEENT: Head atraumatic, normocephalic. Oropharynx and nasopharynx clear.  Oral mucosa LUNGS: Normal breath sounds bilaterally, no wheezing, rales, rhonchi. No use of accessory muscles of respiration.  CARDIOVASCULAR: S1, S2 normal. No murmurs, rubs, or gallops.  ABDOMEN: Soft, nontender, nondistended. Bowel sounds present. No organomegaly or mass.  EXTREMITIES: No cyanosis, clubbing or edema b/l.    NEUROLOGIC: all extremities well.  PSYCHIATRIC:  patient is alert however has dementia at baseline nonverbal at present SKIN: No obvious rash, lesion, or ulcer.  LABORATORY PANEL:  CBC Recent Labs  Lab 01/19/20 0648  WBC 9.0  HGB 17.4*  HCT 60.8*  PLT 219    Chemistries  Recent Labs  Lab 01/20/20 0303  NA 154*  K 3.8  CL 118*  CO2 29  GLUCOSE 281*  BUN 39*  CREATININE 1.11*  CALCIUM 9.3   Cardiac Enzymes No results for input(s): TROPONINI in the last 168 hours. RADIOLOGY:   No results found. ASSESSMENT AND PLAN:  Virginia Barnes is a 74 y.o. female with medical history significant of recent diagnosis of COVID-19 pneumonia about 2 weeks ago, diastolic function CHF, diabetes, hypertension, frequent falls, suspected UTI who was brought in from [place] due to decreased oral intake and functioning since her diagnosis of COVID-19 about 2 weeks ago.  She has had no more fever or chills she has had no significant symptoms of the Covid but patient has gotten weak and weak and dehydrated.  #1 hypernatremia/Acute renal failure : Most likely due to dehydration -Patient has had poor oral intake.    -She is a diabetic and will use  Ringer's lactate.  She has had boluses of fluids to expand her volume in the ER.  Monitor sodium level. --H/H elevated due to hemoconcentration/dehydraiton -Na 157->154 -BMP monitor  #2 recent COVID-19 infection: No respiratory symptoms.  This may have weakened patient to the point where she is having poor oral intake.    #3 diabetes: Sliding scale insulin and close monitoring   #4 hyperkalemia:  Present on admission - most likely as a result of the dehydration.   -Hold any potassium supplementation or nephrotoxic medication.   -cont IVF -potassium has normalized  #5 paroxysmal atrial fibrillation: Stable at baseline.  Continue to monitor -on sotalol -on Xarelto for anticoagulation  #6 hypertension: Blood pressure remains elevated.   -resume hydralazine increase dose according to blood pressure  #7 UTI --pt unable to elicit any  symptoms given presentation and dark urine with abnormal UA patient on IV antibiotics -change to oral at discharge  Per Nursing and speech therapy patient ate some lunch today   DVT prophylaxis: Xarelto Code Status: Full code Family Communication:  Talked with Lenaya Pietsch over phone at [phone].  Annabelle Harman reported that patient has been followed by palliative care/hospice at [facility] house.  She recalls  filling out some paperwork there.  I have messaged TOC team to confirm same. Disposition Plan: Back to ALF at discharge in next 1 to 2 days once sodium normalizes likely with palliative care/hospice Consults called: None Admission status: Inpatient Barriers to discharge: Waiting for sodium to normalize  TOTAL TIME TAKING CARE OF THIS PATIENT: 30 minutes.  >50% time spent on counselling and coordination of care  Note: This dictation was prepared with Dragon dictation along with smaller phrase technology. Any transcriptional errors that result from this process are unintentional.  Delfino Lovett M.D    [facility]   CC: Primary care physician; Margaretann Loveless, MDPatient ID: Virginia Barnes, female   DOB: May 08, 1946, 74 y.o.   MRN: [id number]

## 2020-01-20 NOTE — Progress Notes (Signed)
Speech Language Pathology Treatment: Dysphagia  Patient Details Name: Virginia Barnes MRN: [id number] DOB: 1946-08-16 Today's Date: 01/20/2020 Time: 9024-0973 SLP Time Calculation (min) (ACUTE ONLY): 45 min  Assessment / Plan / Recommendation Clinical Impression  Pt seen for ongoing assessment of toleration of oral diet; safety w/ po's. Pt has been somnolent at times not taking much po's per NSG report. Her engagement, both physical and verbal, w/ others is limited; she has been taking meds in Puree w/ NSG. Pt was admitted to the hospital from [facility] due to decreased oral intake and functioning since her diagnosis of COVID-19 ~2 weeks ago.  Pt awakened w/ SLP's verbal/tactile stim and w/ mumbled/muttered speech, she agreed to sips and bites from SLP. Pt required full positioning support upright in bed. She consumed trials of thin liquids via Straw (often pinched/paced to monitor large boluses) w/ no immediate, overt clinical s/s of aspiration; noted a mild throat clear post 2 sips/trials and min oral holding during the oral phase. Pt given gentle verbal cues and Time to swallow and orally clear b/t giving further trials. Noted the min delay in A-P transfer occurred intermittently as trials continued w/ both thin liquids and Puree. SLP alternated foods/liquids to aid oral clearing and intake. Educated and instructed pt on general aspiration precautions to include smaller sips, slowly and to complete the swallow/oral clearing w/ each bite/sip. Practiced this w/ pt. Pt consumed several bites of Purees and ~4ozs of thin liquids(Sweet  Tea) from her Lunch meal. No decline in vocal quality(mumbled speech responses intermittently) or respiratory status noted during/post trials. Pt declined most offerings of foods except for the Magic Cup ice cream. Pt required full feeding support d/t weakness and Cognitive decline.  Recommend continue current diet of purees (d/t weakness and Cognitive decline) w/ thin  liquids; general aspiration precautions; feeding support at meals w/ cues for oral clearing during eating/drinking; cues for attention to po tasks. Pills in Puree - Crushed for safer swallowing d/t Cognitive status, illness. ST services will continue to f/u w/ pt's status and give trials of increased textured foods when appropriate.      HPI  Pt is a 74 y.o.femalewith medical history significant ofrecent diagnosis of COVID-19 pneumonia about 2 weeks ago, diastolic function CHF, diabetes, hypertension, frequent falls, suspected UTI who was brought in from [place] due to decreased oral intake and functioning since her diagnosis of COVID-19 about 2 weeks ago. She has had no more fever or chills she has had no significant symptoms of the Covid but patient has gotten weak and weak and dehydrated. She was suspected to have UTI from there and was sent to the ER for evaluation. In the ER patient was having oxygen sat of 98% on room air with no respiratory symptoms. Lateral abnormalities was however noted. Patient also noted to have failure to thrive probably acid reaction to her recent COVID-19 diagnosis. She is hypertensive with systolic of 532 on arrival and blood sugar is up to 200. At this point she is being admitted for supportive care and failure to thrive with significant hypotension and hypernatremia".      SLP Plan  Continue with current plan of care       Recommendations  Diet recommendations: Dysphagia 1 (puree);Thin liquid Liquids provided via: Cup;Straw(monitor) Medication Administration: Crushed with puree(for safer swallowing) Supervision: Staff to assist with self feeding;Full supervision/cueing for compensatory strategies Compensations: Minimize environmental distractions;Slow rate;Small sips/bites;Lingual sweep for clearance of pocketing;Multiple dry swallows after each bite/sip;Follow solids with  liquid Postural Changes and/or Swallow Maneuvers: Seated upright 90  degrees;Upright 30-60 min after meal                General recommendations: (dietician f/u) Oral Care Recommendations: Oral care BID;Oral care before and after PO;Staff/trained caregiver to provide oral care Follow up Recommendations: Skilled Nursing facility(TBD) SLP Visit Diagnosis: Dysphagia, oral phase (R13.11)(Cognitive decline) Plan: Continue with current plan of care       GO                 Jerilynn Som, MS, CCC-SLP Christa Fasig 01/20/2020, 3:53 PM

## 2020-01-20 NOTE — Progress Notes (Addendum)
Inpatient Diabetes Program Recommendations  AACE/ADA: New Consensus Statement on Inpatient Glycemic Control (2015)  Target Ranges:  Prepandial:   less than 140 mg/dL      Peak postprandial:   less than 180 mg/dL (1-2 hours)      Critically ill patients:  140 - 180 mg/dL   Lab Results  Component Value Date   GLUCAP 258 (H) 01/20/2020   HGBA1C 7.5 (H) 01/18/2020    Review of Glycemic Control Results for Virginia Barnes, Virginia Barnes (MRN [id number]) as of 01/20/2020 14:11  Ref. Range 01/19/2020 11:41 01/19/2020 16:35 01/19/2020 21:17 01/20/2020 07:59 01/20/2020 11:47  Glucose-Capillary Latest Ref Range: 70 - 99 mg/dL 964 (H) 383 (H) 818 (H) 222 (H) 258 (H)   Diabetes history: DM 2 Outpatient Diabetes medications:  Humalog 0-10 units tid with meals Current orders for Inpatient glycemic control:  Novolog sensitive tid with meals and HS  Inpatient Diabetes Program Recommendations:   If appropriate, consider adding Levemir 8 units daily.   Thanks  Beryl Meager, RN, BC-ADM Inpatient Diabetes Coordinator Pager [phone]

## 2020-01-20 NOTE — TOC Initial Note (Signed)
Transition of Care [facility]) - Initial/Assessment Note    Patient Details  Name: Virginia Barnes MRN: [id number] Date of Birth: 31-Jan-1946  Transition of Care [facility]) CM/SW Contact:    Shelbie Hutching, RN Phone Number: 01/20/2020, 10:14 AM  Clinical Narrative:                 Patient admitted with UTI and dehydration and hypernatremia.  Patient diagnosed with COVID 2 weeks ago at [facility].  Patient's daughter in law Hinton Dyer, reports patient has been a [employer] for about 3 years and lived alone before that.  Hinton Dyer reports that the patient's main issue has been mobility and she does not walk but is mostly in a wheelchair.  Discharge plan will be for patient to return to her assisted living facility.  RNCM will continue to follow and assist with discharge planning.   Expected Discharge Plan: Assisted Living Barriers to Discharge: Continued Medical Work up   Patient Goals and CMS Choice        Expected Discharge Plan and Services Expected Discharge Plan: Assisted Living   Discharge Planning Services: CM Consult   Living arrangements for the past 2 months: [place]                                      Prior Living Arrangements/Services Living arrangements for the past 2 months: [place] Lives with:: Facility Resident Patient language and need for interpreter reviewed:: Yes Do you feel safe going back to the place where you live?: Yes      Need for Family Participation in Patient Care: Yes (Comment)(COVID and ALF facility resident) Care giver support system in place?: Yes (comment) Current home services: DME(Wheelchair and walker) Criminal Activity/Legal Involvement Pertinent to Current Situation/Hospitalization: No - Comment as needed  Activities of Daily Living   ADL Screening (condition at time of admission) Patient's cognitive ability adequate to safely complete daily activities?: No Is the patient deaf or have difficulty hearing?:  No Does the patient have difficulty seeing, even when wearing glasses/contacts?: No Does the patient have difficulty concentrating, remembering, or making decisions?: Yes Patient able to express need for assistance with ADLs?: No Does the patient have difficulty dressing or bathing?: Yes Independently performs ADLs?: No Communication: Dependent Is this a change from baseline?: Pre-admission baseline Dressing (OT): Dependent Is this a change from baseline?: Pre-admission baseline Grooming: Dependent Is this a change from baseline?: Pre-admission baseline Feeding: Dependent Is this a change from baseline?: Pre-admission baseline Bathing: Dependent Is this a change from baseline?: Pre-admission baseline Toileting: Dependent Is this a change from baseline?: Pre-admission baseline In/Out Bed: Dependent Is this a change from baseline?: Pre-admission baseline Walks in Home: Dependent Is this a change from baseline?: Pre-admission baseline Does the patient have difficulty walking or climbing stairs?: Yes Weakness of Legs: Both Weakness of Arms/Hands: Both  Permission Sought/Granted Permission sought to share information with : Case Manager, Family Supports, Chartered certified accountant granted to share information with : Yes, Verbal Permission Granted     Permission granted to share info w AGENCY: [place] granted to share info w Relationship: daughter in law and son     Emotional Assessment       Orientation: : Oriented to Self Alcohol / Substance Use: Not Applicable Psych Involvement: No (comment)  Admission diagnosis:  Dehydration [E86.0] Hypernatremia [E87.0] COVID-19 [U07.1] Patient Active Problem  List   Diagnosis Date Noted  . Dehydration   . Hypernatremia 01/18/2020  . Hyperkalemia 01/18/2020  . COVID-19 virus infection 01/18/2020  . Hematoma 07/25/2018  . Frequent falls 07/15/2018  . Imbalance 07/15/2018  . [place]addle pulmonary embolus  (HCC) 05/31/2018  . Chronic diastolic heart failure (HCC) 02/17/2018  . HTN (hypertension) 02/17/2018  . PAF (paroxysmal atrial fibrillation) (HCC) 02/17/2018  . Lymphedema 02/17/2018  . Diabetes (HCC) 02/17/2018  . Sepsis (HCC) 01/30/2018  . Hypoglycemia 01/12/2018  . Knee pain 05/17/2017   PCP:  Margaretann Loveless, MD Pharmacy:   [facility] [address], [place] - [address] [place] [address] Phone: [phone] Fax: [phone]     Social Determinants of Health (SDOH) Interventions    Readmission Risk Interventions No flowsheet data found.

## 2020-01-20 NOTE — Progress Notes (Signed)
PT Cancellation Note  Patient Details Name: Virginia Barnes MRN: [id number] DOB: 07-Mar-1946   Cancelled Treatment:    Reason Eval/Treat Not Completed: Patient's level of consciousness(Chart reviewed, RN consulted. Most recent Na+ just below cutoff for holding PT services. Pt asleep upon entry, awakens to gentle touch.) Pt extremely hypophonic, heavily limiting with negative pressure fan blasting. Pt maintains eye contact, responds verbally to questioning less than 25% of engagement. Does squeeze fingers when cued, but requires significant increased processing time, grips severely weak bilat. Pt does follow any other cues in a meaningful way. Author attempted P/ROM assessment of limbs, noted rigidity in 4 limbs and contracture of 2 elbows and 2 knees. RN in room during visit. Will hold PT evaluation until pt able to participate.   1:00 PM, 01/20/20 Rosamaria Lints, PT, DPT Physical Therapist - [facility]  [phone] (ASCOM)     Lorann Tani C 01/20/2020, 12:57 PM

## 2020-01-21 DIAGNOSIS — U071 COVID-19: Secondary | ICD-10-CM

## 2020-01-21 DIAGNOSIS — Z515 Encounter for palliative care: Secondary | ICD-10-CM

## 2020-01-21 DIAGNOSIS — Z66 Do not resuscitate: Secondary | ICD-10-CM

## 2020-01-21 DIAGNOSIS — Z7189 Other specified counseling: Secondary | ICD-10-CM

## 2020-01-21 DIAGNOSIS — R627 Adult failure to thrive: Secondary | ICD-10-CM

## 2020-01-21 LAB — CBC
HCT: 46.1 % — ABNORMAL HIGH (ref 36.0–46.0)
Hemoglobin: 13.7 g/dL (ref 12.0–15.0)
MCH: 29.3 pg (ref 26.0–34.0)
MCHC: 29.7 g/dL — ABNORMAL LOW (ref 30.0–36.0)
MCV: 98.5 fL (ref 80.0–100.0)
Platelets: 161 10*3/uL (ref 150–400)
RBC: 4.68 MIL/uL (ref 3.87–5.11)
RDW: 13 % (ref 11.5–15.5)
WBC: 6.5 10*3/uL (ref 4.0–10.5)
nRBC: 0 % (ref 0.0–0.2)

## 2020-01-21 LAB — BASIC METABOLIC PANEL
Anion gap: 6 (ref 5–15)
BUN: 26 mg/dL — ABNORMAL HIGH (ref 8–23)
CO2: 29 mmol/L (ref 22–32)
Calcium: 8.8 mg/dL — ABNORMAL LOW (ref 8.9–10.3)
Chloride: 108 mmol/L (ref 98–111)
Creatinine, Ser: 0.99 mg/dL (ref 0.44–1.00)
GFR calc Af Amer: >60 mL/min (ref >60)
GFR calc non Af Amer: 56 mL/min — ABNORMAL LOW (ref >60)
Glucose, Bld: 259 mg/dL — ABNORMAL HIGH (ref 70–99)
Potassium: 3.3 mmol/L — ABNORMAL LOW (ref 3.5–5.1)
Sodium: 143 mmol/L (ref 135–145)

## 2020-01-21 LAB — GLUCOSE, CAPILLARY
Glucose-Capillary: 256 mg/dL — ABNORMAL HIGH (ref 70–99)
Glucose-Capillary: 226 mg/dL — ABNORMAL HIGH (ref 70–99)
Glucose-Capillary: 273 mg/dL — ABNORMAL HIGH (ref 70–99)

## 2020-01-21 MED ORDER — NEPRO/CARBSTEADY PO LIQD: 237.0000 mL | Freq: Three times a day (TID) | ORAL | 30 refills | Status: AC

## 2020-01-21 MED ORDER — ASCORBIC ACID 250 MG PO TABS: 250.0000 mg | ORAL_TABLET | Freq: Every day | ORAL | 0 refills | Status: AC

## 2020-01-21 NOTE — Discharge Summary (Signed)
[facility] [place] NAME: Virginia Barnes    MR#:  [id number]  DATE OF BIRTH:  06/01/1946  DATE OF ADMISSION:  01/18/2020   ADMITTING PHYSICIAN: Elwyn Reach, MD  DATE OF DISCHARGE: 01/21/2020  PRIMARY CARE PHYSICIAN: Perrin Maltese, MD   ADMISSION DIAGNOSIS:  Dehydration [E86.0] Hypernatremia [E87.0] COVID-19 [U07.1] DISCHARGE DIAGNOSIS:  Principal Problem:   Hypernatremia Active Problems:   Chronic diastolic heart failure (HCC)   HTN (hypertension)   PAF (paroxysmal atrial fibrillation) (HCC)   Diabetes (HCC)   Hyperkalemia   COVID-19 virus infection   Dehydration  SECONDARY DIAGNOSIS:   Past Medical History:  Diagnosis Date  . Arrhythmia    atrial fibrillation  . CHF (congestive heart failure) ([place])   . Diabetes mellitus without complication ([place])   . Diverticulitis   . Frequent falls   . Hypertension    HOSPITAL COURSE:  Virginia Barnes a 74 y.o.femalewith medical history significant ofrecent diagnosis of COVID-19 pneumonia about 2 weeks ago, diastolic function CHF, diabetes, hypertension, frequent falls, suspected UTI who was brought in from [place] due to decreased oral intake and functioning since her diagnosis of COVID-19 about 2 weeks ago. She has had no more fever or chills she has had no significant symptoms of the Covid but patient has gotten weak and dehydrated.  #1 hypernatremia/Acute renal failure :Most likely due to dehydration -Patient has had poor oral intake.   -Hydrated with IV fluids and oral nutrition and her sodium is normalized  #2 recent COVID-19 infection:No respiratory symptoms. This may have weakened patient to the point where she is having poor oral intake.  Encourage oral nutrition  #3 diabetes:Resume home regimen  #4 hyperkalemia: Present on admission - most likely as a result of the dehydration.  -Resolved with hydration  #5 paroxysmal atrial fibrillation:Stable at  baseline.  -on sotalol for rate control -on Xarelto for anticoagulation  #6 hypertension:Blood pressure  controlled on home regimen  #7 UTI -based on UA.  Patient was treated with IV antibiotic in the hospital.  She is asymptomatic from this and does not have fever or leukocytosis.  We will stop antibiotics at discharge. DISCHARGE CONDITIONS:  Fair CONSULTS OBTAINED:   DRUG ALLERGIES:   Allergies  Allergen Reactions  . Peanuts [Peanut Oil] Shortness Of Breath  . Lasix [Furosemide] Swelling    Tongue swelling  . Lisinopril Swelling    Tongue swelling   DISCHARGE MEDICATIONS:   Allergies as of 01/21/2020      Reactions   Peanuts [peanut Oil] Shortness Of Breath   Lasix [furosemide] Swelling   Tongue swelling   Lisinopril Swelling   Tongue swelling      Medication List    STOP taking these medications   HYDROcodone-acetaminophen 5-325 MG tablet Commonly known as: NORCO/VICODIN     TAKE these medications   ascorbic acid 250 MG tablet Commonly known as: VITAMIN C Take 1 tablet (250 mg total) by mouth daily.   Calcium 600/Vitamin D 600-400 MG-UNIT Tabs Generic drug: Calcium Carbonate-Vitamin D3 Take 1 tablet by mouth 2 (two) times daily.   cetirizine 10 MG tablet Commonly known as: ZYRTEC Take 10 mg by mouth daily.   cycloSPORINE 0.05 % ophthalmic emulsion Commonly known as: RESTASIS Place 1 drop into both eyes every 12 (twelve) hours.   diclofenac Sodium 1 % Gel Commonly known as: VOLTAREN Apply 4 g topically 2 (two) times daily.   feeding supplement (  NEPRO CARB STEADY) Liqd Take 237 mLs by mouth 3 (three) times daily between meals.   ferrous sulfate 325 (65 FE) MG tablet Take 1 tablet (325 mg total) by mouth 2 (two) times daily with a meal.   fesoterodine 8 MG Tb24 tablet Commonly known as: TOVIAZ Take 8 mg by mouth daily.   fluorometholone 0.1 % ophthalmic suspension Commonly known as: FML Place 1 drop into the right eye 2 (two) times daily.     fluticasone 50 MCG/ACT nasal spray Commonly known as: FLONASE Place 2 sprays into both nostrils daily.   furosemide 40 MG tablet Commonly known as: Lasix Take 0.5 tablets (20 mg total) by mouth daily.   hydrALAZINE 25 MG tablet Commonly known as: APRESOLINE Take 25 mg by mouth 2 (two) times daily.   hyoscyamine 0.125 MG SL tablet Commonly known as: LEVSIN SL Place 0.125 mg under the tongue every 4 (four) hours as needed (secretions).   insulin lispro 100 UNIT/ML injection Commonly known as: HUMALOG Inject 0-10 Units into the skin 3 (three) times daily before meals. Sliding scale 0-150, give 0 units. 151-200 give 2 units. 201-250 give 4 units. 251-300 give 6 units. 301-350 give 8 units. 351-400 give 10 units. If Blood Sugar is greater than 400, call MD   ipratropium 0.03 % nasal spray Commonly known as: ATROVENT Place 2 sprays into both nostrils 3 (three) times daily as needed for rhinitis.   NovoLOG FlexPen 100 UNIT/ML FlexPen Generic drug: insulin aspart Inject 0-10 Units into the skin 3 (three) times daily with meals. <60 Call MD 151-200 - 2 units 201-250 4 units 251-300 - 6 units 301-350 - 8 units 351-400 - 10 units >400 Call MD   omeprazole 40 MG capsule Commonly known as: PRILOSEC Take 1 capsule by mouth daily.   oxybutynin 5 MG 24 hr tablet Commonly known as: DITROPAN-XL Take 5 mg by mouth daily at 6 (six) AM.   potassium chloride 10 MEQ tablet Commonly known as: KLOR-CON Take 10 mEq by mouth daily.   prednisoLONE acetate 1 % ophthalmic suspension Commonly known as: PRED FORTE Place 1 drop into both eyes 2 (two) times daily.   rivaroxaban 20 MG Tabs tablet Commonly known as: XARELTO Take 1 tablet (20 mg total) by mouth daily.   rosuvastatin 20 MG tablet Commonly known as: CRESTOR Take 20 mg by mouth every evening.   sertraline 100 MG tablet Commonly known as: ZOLOFT Take 100 mg by mouth daily.   sotalol 80 MG tablet Commonly known as:  BETAPACE Take 80 mg by mouth 2 (two) times daily.   valACYclovir 1000 MG tablet Commonly known as: VALTREX Take 1,000 mg by mouth daily.      DISCHARGE INSTRUCTIONS:   DIET:  Cardiac diet  Diet recommendations: Dysphagia 1 (puree);Thin liquid Liquids provided via: Cup;Straw(monitor) Medication Administration: Crushed with puree(for safer swallowing) Supervision: Staff to assist with self feeding;Full supervision/cueing for compensatory strategies Compensations: Minimize environmental distractions;Slow rate;Small sips/bites;Lingual sweep for clearance of pocketing;Multiple dry swallows after each bite/sip;Follow solids with liquid Postural Changes and/or Swallow Maneuvers: Seated upright 90 degrees;Upright 30-60 min after meal  General recommendations: (dietician f/u) Oral Care Recommendations: Oral care BID;Oral care before and after PO;Staff/trained caregiver to provide oral care DISCHARGE CONDITION:  [place] ACTIVITY:  Activity as tolerated OXYGEN:  Home Oxygen: No.  Oxygen Delivery: room air DISCHARGE LOCATION:  [place] [facility] with [facility]  If you experience worsening of your admission symptoms, develop shortness of breath, life threatening emergency, suicidal or homicidal  thoughts you must seek medical attention immediately by calling 911 or calling your MD immediately  if symptoms less severe.  You Must read complete instructions/literature along with all the possible adverse reactions/side effects for all the Medicines you take and that have been prescribed to you. Take any new Medicines after you have completely understood and accpet all the possible adverse reactions/side effects.   Please note  You were cared for by a hospitalist during your hospital stay. If you have any questions about your discharge medications or the care you received while you were in the hospital after you are discharged, you can call the unit and asked to speak with the hospitalist on call  if the hospitalist that took care of you is not available. Once you are discharged, your primary care physician will handle any further medical issues. Please note that NO REFILLS for any discharge medications will be authorized once you are discharged, as it is imperative that you return to your primary care physician (or establish a relationship with a primary care physician if you do not have one) for your aftercare needs so that they can reassess your need for medications and monitor your lab values.    On the day of Discharge:  VITAL SIGNS:  Blood pressure (!) 113/36, pulse 61, temperature 98.6 F (37 C), temperature source Oral, resp. rate 18, height 5\' 9"  (1.753 m), weight 96 kg, SpO2 97 %. PHYSICAL EXAMINATION:  GENERAL:  74 y.o.-year-old patient lying in the bed with no acute distress.  EYES: Pupils equal, round, reactive to light and accommodation. No scleral icterus. Extraocular muscles intact.  HEENT: Head atraumatic, normocephalic. Oropharynx and nasopharynx clear.  NECK:  Supple, no jugular venous distention. No thyroid enlargement, no tenderness.  LUNGS: Normal breath sounds bilaterally, no wheezing, rales,rhonchi or crepitation. No use of accessory muscles of respiration.  CARDIOVASCULAR: S1, S2 normal. No murmurs, rubs, or gallops.  ABDOMEN: Soft, non-tender, non-distended. Bowel sounds present. No organomegaly or mass.  EXTREMITIES: No pedal edema, cyanosis, or clubbing.  NEUROLOGIC: Cranial nerves II through XII are intact. Muscle strength 5/5 in all extremities. Sensation intact. Gait not checked.  PSYCHIATRIC: The patient is alert and oriented x 3.  SKIN: No obvious rash, lesion, or ulcer.  DATA REVIEW:   CBC Recent Labs  Lab 01/21/20 0408  WBC 6.5  HGB 13.7  HCT 46.1*  PLT 161    Chemistries  Recent Labs  Lab 01/21/20 0408  NA 143  K 3.3*  CL 108  CO2 29  GLUCOSE 259*  BUN 26*  CREATININE 0.99  CALCIUM 8.8*     Microbiology Results  Results for  orders placed or performed during the hospital encounter of 11/06/18  Blood Culture (routine x 2)     Status: None   Collection Time: 11/06/18  6:32 AM   Specimen: BLOOD  Result Value Ref Range Status   Specimen Description BLOOD LEFT ARM  Final   Special Requests   Final    BOTTLES DRAWN AEROBIC AND ANAEROBIC Blood Culture adequate volume   Culture   Final    NO GROWTH 5 DAYS Performed at [facility], [address], [place], [place]    Report Status 11/11/2018 FINAL  Final  Urine culture     Status: None   Collection Time: 11/06/18  7:17 AM   Specimen: Urine, Random  Result Value Ref Range Status   Specimen Description   Final    URINE, RANDOM Performed at [facility]  Lab, [address], [place], [place] [address]    Special Requests   Final    NONE Performed at [facility], [address], [place], [place] [address]    Culture   Final    NO GROWTH Performed at [facility], [address] [place], [place] [address]    Report Status 11/07/2018 FINAL  Final  Blood Culture (routine x 2)     Status: None   Collection Time: 11/06/18  7:27 AM   Specimen: BLOOD  Result Value Ref Range Status   Specimen Description BLOOD RIGHT [facility]  Final   Special Requests   Final    BOTTLES DRAWN AEROBIC AND ANAEROBIC Blood Culture adequate volume   Culture   Final    NO GROWTH 5 DAYS Performed at [facility], [address] [place], [place] [address]    Report Status 11/11/2018 FINAL  Final    Follow-up Information    Margaretann Loveless, MD. Schedule an appointment as soon as possible for a visit in 1 week(s).   Specialty: Internal Medicine Contact information: [address] [place] [address] [phone]            Management plans discussed with the patient, family (discussed with son and daughter over phone) and they are in agreement.  CODE STATUS: DNR -followed by [facility] hospice as an outpatient.  Avoid  hospitalization if at all possible.  High risk for readmission.  High risk for ongoing poor p.o. intake and dehydration along with infections.  TOTAL TIME TAKING CARE OF THIS PATIENT: 45 minutes.    Delfino Lovett M.D on 01/21/2020 at 11:57 AM  [facility]   CC: Primary care physician; Margaretann Loveless, MD   Note: This dictation was prepared with Dragon dictation along with smaller phrase technology. Any transcriptional errors that result from this process are unintentional.

## 2020-01-21 NOTE — Progress Notes (Signed)
OT Cancellation Note  Patient Details Name: Virginia Barnes MRN: [id number] DOB: 01-12-1946   Cancelled Treatment:    Reason Eval/Treat Not Completed: Patient at procedure or test/ unavailable. Order received and chart reviewed. Upon arrival to pt room, pt with EMS transport in room for DC to SNF. Will complete OT order at this time.   Rockney Ghee, M.S., OTR/L Ascom: [phone] 01/21/20, 2:25 PM

## 2020-01-21 NOTE — TOC Transition Note (Signed)
Transition of Care [facility]) - CM/SW Discharge Note   Patient Details  Name: SAM WUNSCHEL MRN: [id number] Date of Birth: 07/27/1946  Transition of Care [facility]) CM/SW Contact:  Allayne Butcher, RN Phone Number: 01/21/2020, 1:00 PM   Clinical Narrative:    Patient is medically cleared to discharge back to [facility] today. Patient is followed by [facility] and Eugenio Hoes with [facility] aware of patient discharge today.   Patient will transport via [facility]- this RNCM has arranged for transport, patient is next on the list for pick up. Bedside RN has informed patient's son Gaynell Face of patient discharge.     Final next level of care: Assisted Living Barriers to Discharge: Barriers Resolved   Patient Goals and CMS Choice        Discharge Placement              Patient chooses bed at: [facility]) Patient to be transferred to facility by: [facility] Name of family member notified: AmeLie Hollars - son Patient and family notified of of transfer: 01/21/20  Discharge Plan and Services   Discharge Planning Services: CM Consult                                 Social Determinants of Health (SDOH) Interventions     Readmission Risk Interventions No flowsheet data found.

## 2020-01-21 NOTE — Progress Notes (Signed)
Called and updated patient's son Gaynell Face) on patient's status. Transferred call into room so that patient could speak with her son.

## 2020-01-21 NOTE — Evaluation (Addendum)
Physical Therapy Evaluation Patient Details Name: Virginia Barnes MRN: [id number] DOB: Oct 04, 1946 Today's Date: 01/21/2020   History of Present Illness  Virginia Barnes is a 73yoF who comes to [facility]  on 01/18/20 from [facility] c hypoxia and weakness. Recent diagnosis of COVID-19 pneumonia about 2 weeks ago. PMH: dCHF, diabetes, hypertension, frequent falls. Pt admitted with hypernatremia, hyperkalemia, HTN, UTI. Per care management note: Patient's daughter in law Annabelle Harman, reports patient has been a [employer] for about 3 years and lived alone before that.  Annabelle Harman reports that the patient's main issue has been mobility and she does not walk but is mostly in a wheelchair. Care-givers at facility report pt has been +2, total care since having covid several weeks ago, has stopped self feeding.  Clinical Impression  Pt admitted with above diagnosis. Pt currently with functional limitations due to the deficits listed below (see "PT Problem List"). Upon entry, pt in bed, asleep but easily made awake and is somewhat interactive. Pt continues to have delayed processing and response times, flat affect, bradykinesia, hypophonia. Pt follows simple multimodal cues for basic mobility in bed, but difficult to redirect patient at times, at one point pt is slowly bundling all of her lines and leads, then asks for her pocket book so she can put them in there, difficult to convince patient to release her grasp on lines/leads. Total Assist +2 for most bed mobility. Functional mobility assessment demonstrates increased effort/time requirements, poor tolerance, and need for physical assistance, whereas the patient performed these at a similar level of independence PTA (per the last few weeks). Pt is able to sit at EOB for about 10 minutes with minA support of trunk, but never able to achieve independent stability. At end of session pt asks for some water, pt offered per straw, takes 5 continuous sips and coughs, then takes 5 more  continuous sips and coughs again. RN made aware. Cough is weak and concerning due to poor utility. Pt will benefit from skilled PT intervention to increase independence and safety with basic mobility in preparation for discharge to the venue listed below.       Follow Up Recommendations Home health PT;Supervision for mobility/OOB;Supervision - Intermittent    Equipment Recommendations  None recommended by PT    Recommendations for Other Services       Precautions / Restrictions Precautions Precautions: Fall Restrictions Weight Bearing Restrictions: No      Mobility  Bed Mobility Overal bed mobility: Needs Assistance Bed Mobility: Rolling;Supine to Sit;Sit to Supine Rolling: Total assist   Supine to sit: +2 for physical assistance;Total assist Sit to supine: +2 for physical assistance;Total assist   General bed mobility comments: able to sit EOB with constant minA trunk support to prevent retropulsion onto bed; pt fixated on lines/leads rather than following cues for posture.  Transfers                 General transfer comment: no safe to attempt, lethargic, altered, and weak.  Ambulation/Gait                Stairs            Wheelchair Mobility    Modified Rankin (Stroke Patients Only)       Balance Overall balance assessment: Needs assistance Sitting-balance support: No upper extremity supported;Feet unsupported Sitting balance-Leahy Scale: Poor  Pertinent Vitals/Pain Pain Assessment: Faces Faces Pain Scale: No hurt Pain Intervention(s): Monitored during session    Home Living Family/patient expects to be discharged to:: Skilled nursing facility                 Additional Comments: [facility]    Prior Function Level of Independence: Needs assistance   Gait / Transfers Assistance Needed: 3 months ago, mostly SPT to/from [facility], minA for bed mobility; since Jan total care,  +2 assist           Hand Dominance   Dominant Hand: Right    Extremity/Trunk Assessment   Upper Extremity Assessment Upper Extremity Assessment: Generalized weakness    Lower Extremity Assessment Lower Extremity Assessment: Generalized weakness       Communication      Cognition Arousal/Alertness: Lethargic Behavior During Therapy: Flat affect Overall Cognitive Status: History of cognitive impairments - at baseline Area of Impairment: Attention;Orientation;Following commands;Problem solving                 Orientation Level: Disoriented to;Situation;Time Current Attention Level: Selective;Alternating   Following Commands: Follows one step commands inconsistently;Follows one step commands with increased time     Problem Solving: Slow processing;Decreased initiation;Difficulty sequencing;Requires verbal cues;Requires tactile cues General Comments: able to test grips; will bring finger to nose.      General Comments      Exercises     Assessment/Plan    PT Assessment Patient needs continued PT services  PT Problem List Decreased strength;Decreased activity tolerance;Decreased balance;Decreased mobility       PT Treatment Interventions Balance training;Functional mobility training;Therapeutic activities;Therapeutic exercise;Patient/family education;Cognitive remediation    PT Goals (Current goals can be found in the Care Plan section)  Acute Rehab PT Goals PT Goal Formulation: Patient unable to participate in goal setting Time For Goal Achievement: 02/04/20    Frequency Min 2X/week   Barriers to discharge Inaccessible home environment      Co-evaluation               AM-PAC PT "6 Clicks" Mobility  Outcome Measure Help needed turning from your back to your side while in a flat bed without using bedrails?: Total Help needed moving from lying on your back to sitting on the side of a flat bed without using bedrails?: Total Help needed moving  to and from a bed to a chair (including a wheelchair)?: Total Help needed standing up from a chair using your arms (e.g., wheelchair or bedside chair)?: Total Help needed to walk in hospital room?: Total Help needed climbing 3-5 steps with a railing? : Total 6 Click Score: 6    End of Session   Activity Tolerance: Patient tolerated treatment well;Patient limited by lethargy Patient left: in bed;with call bell/phone within reach;with bed alarm set;Other (comment)(prevalon boots donned) Nurse Communication: Mobility status PT Visit Diagnosis: Muscle weakness (generalized) (M62.81);Other abnormalities of gait and mobility (R26.89)    Time: 1100-1120 PT Time Calculation (min) (ACUTE ONLY): 20 min   Charges:   PT Evaluation $PT Eval Moderate Complexity: 1 Mod PT Treatments $Therapeutic Exercise: 8-22 mins        12:17 PM, 01/21/20 Etta Grandchild, PT, DPT Physical Therapist - [facility]  [phone] ([place])   [place] [facility] 01/21/2020, 12:08 PM

## 2020-01-21 NOTE — Consult Note (Signed)
Consultation Note Date: 01/21/2020   Patient Name: Virginia Barnes  DOB: [birth year]  MRN: [id number]  Age / Sex: 74 y.o., female  PCP: Margaretann Loveless, MD Referring Physician: Delfino Lovett, MD  Reason for Consultation: Establishing goals of care  HPI/Patient Profile: 74 y.o. female  with past medical history of diastolic heart failure, T2DM, HTN and falls admitted on 01/18/2020 with weakness and dehydration. She was recently diagnosed with COVID-19 pneumonia. Also being treated for UTI. Patient was hypernatremic - now better with IV fluids. PMT consulted for GOC.   Clinical Assessment and Goals of Care: I have reviewed medical records including EPIC notes, labs and imaging, received report from RN, assessed the patient and then spoke with patient's son, Gaynell Face, and DIL Annabelle Harman,  to discuss diagnosis prognosis, GOC, EOL wishes, disposition and options.  Per RN, patient is more responsive today and taking pills - still declining food but says it is because she does not like the food. Verbal and interacting with staff.  I introduced Palliative Medicine as specialized medical care for people living with serious illness. It focuses on providing relief from the symptoms and stress of a serious illness. The goal is to improve quality of life for both the patient and the family.  We discussed a brief life review of the patient. They tell me she has been living at [facility] d/t weakness and falls at home.   As far as functional and nutritional status, they tell me she is not able to ambulate independently and is dependent in ADLs. They do say she has been eating and drinking well.   They tell me she stays very tired. They ask about dementia and we discuss that Ms. Gieger does appear to have dementia.    We discussed her current illness and what it means in the larger context of her on-going co-morbidities.  Natural disease trajectory and expectations at EOL  were discussed. We discussed failure to thrive following COVID infection. We discuss that labs have improved now but that is with IV fluids and it is unclear how Ms. Julian will do once fluids are stopped and she returns to ALF. I explained that it is possible she may continue to decline and not start eating/drinking better. She may be nearing end of life. They express understanding.   Family tells me Ms. Westfall never spoke with them about her wishes about her care at end of life.   The difference between aggressive medical intervention and comfort care was considered in light of the patient's goals of care. Family would like to continues current interventions. We do discuss full code vs DNR - recommended DNR and explained why resuscitative measures may only prolong suffering and not add to Ms. Karrer's quality of life - family agrees to DNR status.   Hospice and Palliative Care services outpatient were explained and offered. Family tells me [facility] follows Ms. Baskett at her ALF and they would like this to continue. I notified case Production designer, theatre/television/film.   Questions and concerns were addressed.  The family was encouraged to call with questions or concerns.   Primary Decision Maker NEXT OF KIN - son - Rebie Peale   SUMMARY OF RECOMMENDATIONS   - code status changed to DNR - discussed poor prognosis with family, concern about continued failure to thrive post discharge - hospice with [facility] at ALF (discussed with case manager)  Code Status/Advance Care Planning:  DNR   Symptom Management:   Per primary  Psycho-social/Spiritual:  Desire for further Chaplaincy support:no  Additional Recommendations: Education on Hospice  Prognosis:   Unable to determine - potentially weeks if continues to have poor PO intake  Discharge Planning: ALF with [facility] hosp[facility]      Primary Diagnoses: Present on Admission: . Hypernatremia . PAF (paroxysmal atrial fibrillation) ([place]) . Chronic diastolic heart  failure ([place]) . HTN (hypertension) . Hyperkalemia . COVID-19 virus infection   I have reviewed the medical record, interviewed the patient and family, and examined the patient. The following aspects are pertinent.  Past Medical History:  Diagnosis Date  . Arrhythmia    atrial fibrillation  . CHF (congestive heart failure) ([place])   . Diabetes mellitus without complication ([place])   . Diverticulitis   . Frequent falls   . Hypertension    Social History   Socioeconomic History  . Marital status: Widowed    Spouse name: Not on file  . Number of children: Not on file  . Years of education: Not on file  . Highest education level: Not on file  Occupational History  . Occupation: retired  Tobacco Use  . Smoking status: Former Smoker    Packs/day: 0.50    Years: 15.00    Pack years: 7.50    Types: Cigarettes  . Smokeless tobacco: Never Used  Substance and Sexual Activity  . Alcohol use: No  . Drug use: No  . Sexual activity: Never  Other Topics Concern  . Not on file  Social History Narrative  . Not on file   Social Determinants of Health   Financial Resource Strain:   . Difficulty of Paying Living Expenses: Not on file  Food Insecurity:   . Worried About Charity fundraiser in the Last Year: Not on file  . Ran Out of Food in the Last Year: Not on file  Transportation Needs:   . Lack of Transportation (Medical): Not on file  . Lack of Transportation (Non-Medical): Not on file  Physical Activity:   . Days of Exercise per Week: Not on file  . Minutes of Exercise per Session: Not on file  Stress:   . Feeling of Stress : Not on file  Social Connections:   . Frequency of Communication with Friends and Family: Not on file  . Frequency of Social Gatherings with Friends and Family: Not on file  . Attends Religious Services: Not on file  . Active Member of Clubs or Organizations: Not on file  . Attends Archivist Meetings: Not on file  . Marital Status: Not on  file   Family History  Problem Relation Age of Onset  . Stomach cancer Sister 71  . Vaginal cancer Sister 60  . Heart disease Father   . Breast cancer Neg Hx    Scheduled Meds: . vitamin C  250 mg Oral BID  . calcium-vitamin D  1 tablet Oral BID  . cycloSPORINE  1 drop Both Eyes Q12H  . feeding supplement (NEPRO CARB STEADY)  237 mL Oral TID BM  . ferrous sulfate  325 mg Oral BID WC  . fesoterodine  8 mg Oral Daily  . fluorometholone  1 drop Right Eye BID  . hydrALAZINE  25 mg Oral BID  . insulin aspart  0-5 Units Subcutaneous QHS  . insulin aspart  0-9 Units Subcutaneous TID WC  . insulin detemir  8 Units Subcutaneous Daily  . loratadine  10 mg Oral Daily  . multivitamin with minerals  1 tablet Oral Daily  .  oxybutynin  5 mg Oral Q0600  . pantoprazole  40 mg Oral Daily  . rivaroxaban  20 mg Oral Q supper  . rosuvastatin  20 mg Oral QPM  . sertraline  100 mg Oral Daily  . sotalol  80 mg Oral BID  . valACYclovir  1,000 mg Oral Daily   Continuous Infusions: . cefTRIAXone (ROCEPHIN)  IV 1 g (01/21/20 0514)  . dextrose 125 mL/hr at 01/20/20 2257   PRN Meds:.acetaminophen **OR** acetaminophen, hydrALAZINE, hyoscyamine, ipratropium, ondansetron **OR** ondansetron (ZOFRAN) IV Allergies  Allergen Reactions  . Peanuts [Peanut Oil] Shortness Of Breath  . Lasix [Furosemide] Swelling    Tongue swelling  . Lisinopril Swelling    Tongue swelling    Vital Signs: BP (!) 104/26   Pulse (!) 55   Temp 98.6 F (37 C) (Oral)   Resp 17   Ht 5\' 9"  (1.753 m)   Wt 96 kg   SpO2 98%   BMI 31.25 kg/m  Pain Scale: 0-10   Pain Score: 0-No pain   SpO2: SpO2: 98 % O2 Device:SpO2: 98 % O2 Flow Rate: .   IO: Intake/output summary:   Intake/Output Summary (Last 24 hours) at 01/21/2020 1204 Last data filed at 01/20/2020 2257 Gross per 24 hour  Intake 500 ml  Output 650 ml  Net -150 ml    LBM:   Baseline Weight: Weight: 96 kg Most recent weight: Weight: 96 kg     Palliative  Assessment/Data: PPS 20%    The above conversation was completed via telephone due to the visitor restrictions during the COVID-19 pandemic. Thorough chart review and discussion with necessary members of the care team was completed as part of assessment. All issues were discussed and addressed but no physical exam was performed.  Time Total: 50 minutes Greater than 50%  of this time was spent counseling and coordinating care related to the above assessment and plan.  01/22/2020, DNP, AGNP-C Palliative Medicine Team [phone] Pager: [phone]

## 2020-01-21 NOTE — Discharge Instructions (Signed)
COVID-19: How to Protect Yourself and Others Know how it spreads  There is currently no vaccine to prevent coronavirus disease 2019 (COVID-19).  The best way to prevent illness is to avoid being exposed to this virus.  The virus is thought to spread mainly from person-to-person. ? Between people who are in close contact with one another (within about 6 feet). ? Through respiratory droplets produced when an infected person coughs, sneezes or talks. ? These droplets can land in the mouths or noses of people who are nearby or possibly be inhaled into the lungs. ? COVID-19 may be spread by people who are not showing symptoms. Everyone should Clean your hands often  Wash your hands often with soap and water for at least 20 seconds especially after you have been in a public place, or after blowing your nose, coughing, or sneezing.  If soap and water are not readily available, use a hand sanitizer that contains at least 60% alcohol. Cover all surfaces of your hands and rub them together until they feel dry.  Avoid touching your eyes, nose, and mouth with unwashed hands. Avoid close contact  Limit contact with others as much as possible.  Avoid close contact with people who are sick.  Put distance between yourself and other people. ? Remember that some people without symptoms may be able to spread virus. ? This is especially important for people who are at higher risk of getting very GainPain.com.cy Cover your mouth and nose with a mask when around others  You could spread COVID-19 to others even if you do not feel sick.  Everyone should wear a mask in public settings and when around people not living in their household, especially when social distancing is difficult to maintain. ? Masks should not be placed on young children under age 57, anyone who has trouble breathing, or is unconscious, incapacitated or otherwise  unable to remove the mask without assistance.  The mask is meant to protect other people in case you are infected.  Do NOT use a facemask meant for a Dietitian.  Continue to keep about 6 feet between yourself and others. The mask is not a substitute for social distancing. Cover coughs and sneezes  Always cover your mouth and nose with a tissue when you cough or sneeze or use the inside of your elbow.  Throw used tissues in the trash.  Immediately wash your hands with soap and water for at least 20 seconds. If soap and water are not readily available, clean your hands with a hand sanitizer that contains at least 60% alcohol. Clean and disinfect  Clean AND disinfect frequently touched surfaces daily. This includes tables, doorknobs, light switches, countertops, handles, desks, phones, keyboards, toilets, faucets, and sinks. [email or web address]  If surfaces are dirty, clean them: Use detergent or soap and water prior to disinfection.  Then, use a household disinfectant. You can see a list of EPA-registered household disinfectants here. [email or web address] 08/05/2019 This information is not intended to replace advice given to you by your health care provider. Make sure you discuss any questions you have with your health care provider. Document Revised: 08/13/2019 Document Reviewed: 06/11/2019 Elsevier Patient Education  2020 [place].   COVID-19 COVID-19 is a respiratory infection that is caused by a virus called severe acute respiratory syndrome coronavirus 2 (SARS-CoV-2). The disease is also known as coronavirus disease or novel coronavirus. In some people, the virus may not cause any symptoms. In others, it may cause a serious infection. The infection  can get worse quickly and can lead to complications, such as:  Pneumonia, or infection of the lungs.  Acute respiratory distress syndrome or ARDS. This is a  condition in which fluid build-up in the lungs prevents the lungs from filling with air and passing oxygen into the blood.  Acute respiratory failure. This is a condition in which there is not enough oxygen passing from the lungs to the body or when carbon dioxide is not passing from the lungs out of the body.  Sepsis or septic shock. This is a serious bodily reaction to an infection.  Blood clotting problems.  Secondary infections due to bacteria or fungus.  Organ failure. This is when your body's organs stop working. The virus that causes COVID-19 is contagious. This means that it can spread from person to person through droplets from coughs and sneezes (respiratory secretions). What are the causes? This illness is caused by a virus. You may catch the virus by:  Breathing in droplets from an infected person. Droplets can be spread by a person breathing, speaking, singing, coughing, or sneezing.  Touching something, like a table or a doorknob, that was exposed to the virus (contaminated) and then touching your mouth, nose, or eyes. What increases the risk? Risk for infection You are more likely to be infected with this virus if you:  Are within 6 feet (2 meters) of a person with COVID-19.  Provide care for or live with a person who is infected with COVID-19.  Spend time in crowded indoor spaces or live in shared housing. Risk for serious illness You are more likely to become seriously ill from the virus if you:  Are 50 years of age or older. The higher your age, the more you are at risk for serious illness.  Live in a nursing home or long-term care facility.  Have cancer.  Have a long-term (chronic) disease such as: ? Chronic lung disease, including chronic obstructive pulmonary disease or asthma. ? A long-term disease that lowers your body's ability to fight infection (immunocompromised). ? Heart disease, including heart failure, a condition in which the arteries that lead to  the heart become narrow or blocked (coronary artery disease), a disease which makes the heart muscle thick, weak, or stiff (cardiomyopathy). ? Diabetes. ? Chronic kidney disease. ? Sickle cell disease, a condition in which red blood cells have an abnormal "sickle" shape. ? Liver disease.  Are obese. What are the signs or symptoms? Symptoms of this condition can range from mild to severe. Symptoms may appear any time from 2 to 14 days after being exposed to the virus. They include:  A fever or chills.  A cough.  Difficulty breathing.  Headaches, body aches, or muscle aches.  Runny or stuffy (congested) nose.  A sore throat.  New loss of taste or smell. Some people may also have stomach problems, such as nausea, vomiting, or diarrhea. Other people may not have any symptoms of COVID-19. How is this diagnosed? This condition may be diagnosed based on:  Your signs and symptoms, especially if: ? You live in an area with a COVID-19 outbreak. ? You recently traveled to or from an area where the virus is common. ? You provide care for or live with a person who was diagnosed with COVID-19. ? You were exposed to a person who was diagnosed with COVID-19.  A physical exam.  Lab tests, which may include: ? Taking a sample of fluid from the back of your nose and throat (nasopharyngeal  fluid), your nose, or your throat using a swab. ? A sample of mucus from your lungs (sputum). ? Blood tests.  Imaging tests, which may include, X-rays, CT scan, or ultrasound. How is this treated? At present, there is no medicine to treat COVID-19. Medicines that treat other diseases are being used on a trial basis to see if they are effective against COVID-19. Your health care provider will talk with you about ways to treat your symptoms. For most people, the infection is mild and can be managed at home with rest, fluids, and over-the-counter medicines. Treatment for a serious infection usually takes  places in a hospital intensive care unit (ICU). It may include one or more of the following treatments. These treatments are given until your symptoms improve.  Receiving fluids and medicines through an IV.  Supplemental oxygen. Extra oxygen is given through a tube in the nose, a face mask, or a hood.  Positioning you to lie on your stomach (prone position). This makes it easier for oxygen to get into the lungs.  Continuous positive airway pressure (CPAP) or bi-level positive airway pressure (BPAP) machine. This treatment uses mild air pressure to keep the airways open. A tube that is connected to a motor delivers oxygen to the body.  Ventilator. This treatment moves air into and out of the lungs by using a tube that is placed in your windpipe.  Tracheostomy. This is a procedure to create a hole in the neck so that a breathing tube can be inserted.  Extracorporeal membrane oxygenation (ECMO). This procedure gives the lungs a chance to recover by taking over the functions of the heart and lungs. It supplies oxygen to the body and removes carbon dioxide. Follow these instructions at home: Lifestyle  If you are sick, stay home except to get medical care. Your health care provider will tell you how long to stay home. Call your health care provider before you go for medical care.  Rest at home as told by your health care provider.  Do not use any products that contain nicotine or tobacco, such as cigarettes, e-cigarettes, and chewing tobacco. If you need help quitting, ask your health care provider.  Return to your normal activities as told by your health care provider. Ask your health care provider what activities are safe for you. General instructions  Take over-the-counter and prescription medicines only as told by your health care provider.  Drink enough fluid to keep your urine pale yellow.  Keep all follow-up visits as told by your health care provider. This is important. How is this  prevented?  There is no vaccine to help prevent COVID-19 infection. However, there are steps you can take to protect yourself and others from this virus. To protect yourself:   Do not travel to areas where COVID-19 is a risk. The areas where COVID-19 is reported change often. To identify high-risk areas and travel restrictions, check the CDC travel website: FatFares.com.br  If you live in, or must travel to, an area where COVID-19 is a risk, take precautions to avoid infection. ? Stay away from people who are sick. ? Wash your hands often with soap and water for 20 seconds. If soap and water are not available, use an alcohol-based hand sanitizer. ? Avoid touching your mouth, face, eyes, or nose. ? Avoid going out in public, follow guidance from your state and local health authorities. ? If you must go out in public, wear a cloth face covering or face mask. Make sure  your mask covers your nose and mouth. ? Avoid crowded indoor spaces. Stay at least 6 feet (2 meters) away from others. ? Disinfect objects and surfaces that are frequently touched every day. This may include:  Counters and tables.  Doorknobs and light switches.  Sinks and faucets.  Electronics, such as phones, remote controls, keyboards, computers, and tablets. To protect others: If you have symptoms of COVID-19, take steps to prevent the virus from spreading to others.  If you think you have a COVID-19 infection, contact your health care provider right away. Tell your health care team that you think you may have a COVID-19 infection.  Stay home. Leave your house only to seek medical care. Do not use public transport.  Do not travel while you are sick.  Wash your hands often with soap and water for 20 seconds. If soap and water are not available, use alcohol-based hand sanitizer.  Stay away from other members of your household. Let healthy household members care for children and pets, if possible. If you  have to care for children or pets, wash your hands often and wear a mask. If possible, stay in your own room, separate from others. Use a different bathroom.  Make sure that all people in your household wash their hands well and often.  Cough or sneeze into a tissue or your sleeve or elbow. Do not cough or sneeze into your hand or into the air.  Wear a cloth face covering or face mask. Make sure your mask covers your nose and mouth. Where to find more information  Centers for Disease Control and Prevention: PurpleGadgets.be  World Health Organization: https://www.castaneda.info/ Contact a health care provider if:  You live in or have traveled to an area where COVID-19 is a risk and you have symptoms of the infection.  You have had contact with someone who has COVID-19 and you have symptoms of the infection. Get help right away if:  You have trouble breathing.  You have pain or pressure in your chest.  You have confusion.  You have bluish lips and fingernails.  You have difficulty waking from sleep.  You have symptoms that get worse. These symptoms may represent a serious problem that is an emergency. Do not wait to see if the symptoms will go away. Get medical help right away. Call your local emergency services (911 in the [place] Do not drive yourself to the hospital. Let the emergency medical personnel know if you think you have COVID-19. Summary  COVID-19 is a respiratory infection that is caused by a virus. It is also known as coronavirus disease or novel coronavirus. It can cause serious infections, such as pneumonia, acute respiratory distress syndrome, acute respiratory failure, or sepsis.  The virus that causes COVID-19 is contagious. This means that it can spread from person to person through droplets from breathing, speaking, singing, coughing, or sneezing.  You are more likely to develop a serious illness if you are 63 years of age  or older, have a weak immune system, live in a nursing home, or have chronic disease.  There is no medicine to treat COVID-19. Your health care provider will talk with you about ways to treat your symptoms.  Take steps to protect yourself and others from infection. Wash your hands often and disinfect objects and surfaces that are frequently touched every day. Stay away from people who are sick and wear a mask if you are sick. This information is not intended to replace advice given to  you by your health care provider. Make sure you discuss any questions you have with your health care provider. Document Revised: 09/18/2019 Document Reviewed: 12/25/2018 Elsevier Patient Education  [place] Can Do to Manage Your COVID-19 Symptoms at Home If you have possible or confirmed COVID-19: 1. Stay home from work and school. And stay away from other public places. If you must go out, avoid using any kind of public transportation, ridesharing, or taxis. 2. Monitor your symptoms carefully. If your symptoms get worse, call your healthcare provider immediately. 3. Get rest and stay hydrated. 4. If you have a medical appointment, call the healthcare provider ahead of time and tell them that you have or may have COVID-19. 5. For medical emergencies, call 911 and notify the dispatch personnel that you have or may have COVID-19. 6. Cover your cough and sneezes with a tissue or use the inside of your elbow. 7. Wash your hands often with soap and water for at least 20 seconds or clean your hands with an alcohol-based hand sanitizer that contains at least 60% alcohol. 8. As much as possible, stay in a specific room and away from other people in your home. Also, you should use a separate bathroom, if available. If you need to be around other people in or outside of the home, wear a mask. 9. Avoid sharing personal items with other people in your household, like dishes, towels, and  bedding. 10. Clean all surfaces that are touched often, like counters, tabletops, and doorknobs. Use household cleaning sprays or wipes according to the label instructions. [email or web address] 06/03/2019 This information is not intended to replace advice given to you by your health care provider. Make sure you discuss any questions you have with your health care provider. Document Revised: 11/05/2019 Document Reviewed: 11/05/2019 Elsevier Patient Education  [place].   COVID-19 Frequently Asked Questions COVID-19 (coronavirus disease) is an infection that is caused by a large family of viruses. Some viruses cause illness in people and others cause illness in animals like camels, cats, and bats. In some cases, the viruses that cause illness in animals can spread to humans. Where did the coronavirus come from? In December 2019, [place] told the [facility]) of several cases of lung disease (human respiratory illness). These cases were linked to an open seafood and livestock market in the city of [place]. The link to the seafood and livestock market suggests that the virus may have spread from animals to humans. However, since that first outbreak in December, the virus has also been shown to spread from person to person. What is the name of the disease and the virus? Disease name Early on, this disease was called novel coronavirus. This is because scientists determined that the disease was caused by a new (novel) respiratory virus. The World Health Organization [facility]) has now named the disease COVID-19, or coronavirus disease. Virus name The virus that causes the disease is called severe acute respiratory syndrome coronavirus 2 (SARS-CoV-2). More information on disease and virus naming World Health Organization [facility]): www.who.int/emergencies/diseases/novel-coronavirus-2019/technical-guidance/naming-the-coronavirus-disease-(covid-2019)-and-the-virus-that-causes-it Who is at  risk for complications from coronavirus disease? Some people may be at higher risk for complications from coronavirus disease. This includes older adults and people who have chronic diseases, such as heart disease, diabetes, and lung disease. If you are at higher risk for complications, take these extra precautions:  Stay home as much as possible.  Avoid social gatherings and travel.  Avoid close contact with others. Stay  at least 6 ft (2 m) away from others, if possible.  Wash your hands often with soap and water for at least 20 seconds.  Avoid touching your face, mouth, nose, or eyes.  Keep supplies on hand at home, such as food, medicine, and cleaning supplies.  If you must go out in public, wear a cloth face covering or face mask. Make sure your mask covers your nose and mouth. How does coronavirus disease spread? The virus that causes coronavirus disease spreads easily from person to person (is contagious). You may catch the virus by:  Breathing in droplets from an infected person. Droplets can be spread by a person breathing, speaking, singing, coughing, or sneezing.  Touching something, like a table or a doorknob, that was exposed to the virus (contaminated) and then touching your mouth, nose, or eyes. Can I get the virus from touching surfaces or objects? There is still a lot that we do not know about the virus that causes coronavirus disease. Scientists are basing a lot of information on what they know about similar viruses, such as:  Viruses cannot generally survive on surfaces for long. They need a human body (host) to survive.  It is more likely that the virus is spread by close contact with people who are sick (direct contact), such as through: ? Shaking hands or hugging. ? Breathing in respiratory droplets that travel through the air. Droplets can be spread by a person breathing, speaking, singing, coughing, or sneezing.  It is less likely that the virus is spread when a  person touches a surface or object that has the virus on it (indirect contact). The virus may be able to enter the body if the person touches a surface or object and then touches his or her face, eyes, nose, or mouth. Can a person spread the virus without having symptoms of the disease? It may be possible for the virus to spread before a person has symptoms of the disease, but this is most likely not the main way the virus is spreading. It is more likely for the virus to spread by being in close contact with people who are sick and breathing in the respiratory droplets spread by a person breathing, speaking, singing, coughing, or sneezing. What are the symptoms of coronavirus disease? Symptoms vary from person to person and can range from mild to severe. Symptoms may include:  Fever or chills.  Cough.  Difficulty breathing or feeling short of breath.  Headaches, body aches, or muscle aches.  Runny or stuffy (congested) nose.  Sore throat.  New loss of taste or smell.  Nausea, vomiting, or diarrhea. These symptoms can appear anywhere from 2 to 14 days after you have been exposed to the virus. Some people may not have any symptoms. If you develop symptoms, call your health care provider. People with severe symptoms may need hospital care. Should I be tested for this virus? Your health care provider will decide whether to test you based on your symptoms, history of exposure, and your risk factors. How does a health care provider test for this virus? Health care providers will collect samples to send for testing. Samples may include:  Taking a swab of fluid from the back of your nose and throat, your nose, or your throat.  Taking fluid from the lungs by having you cough up mucus (sputum) into a sterile cup.  Taking a blood sample. Is there a treatment or vaccine for this virus? Currently, there is no vaccine to  prevent coronavirus disease. Also, there are no medicines like antibiotics or  antivirals to treat the virus. A person who becomes sick is given supportive care, which means rest and fluids. A person may also relieve his or her symptoms by using over-the-counter medicines that treat sneezing, coughing, and runny nose. These are the same medicines that a person takes for the common cold. If you develop symptoms, call your health care provider. People with severe symptoms may need hospital care. What can I do to protect myself and my family from this virus?     You can protect yourself and your family by taking the same actions that you would take to prevent the spread of other viruses. Take the following actions:  Wash your hands often with soap and water for at least 20 seconds. If soap and water are not available, use alcohol-based hand sanitizer.  Avoid touching your face, mouth, nose, or eyes.  Cough or sneeze into a tissue, sleeve, or elbow. Do not cough or sneeze into your hand or the air. ? If you cough or sneeze into a tissue, throw it away immediately and wash your hands.  Disinfect objects and surfaces that you frequently touch every day.  Stay away from people who are sick.  Avoid going out in public, follow guidance from your state and local health authorities.  Avoid crowded indoor spaces. Stay at least 6 ft (2 m) away from others.  If you must go out in public, wear a cloth face covering or face mask. Make sure your mask covers your nose and mouth.  Stay home if you are sick, except to get medical care. Call your health care provider before you get medical care. Your health care provider will tell you how long to stay home.  Make sure your vaccines are up to date. Ask your health care provider what vaccines you need. What should I do if I need to travel? Follow travel recommendations from your local health authority, the CDC, and WHO. Travel information and advice  Centers for Disease Control and Prevention (CDC):  BodyEditor.hu  World Health Organization [facility]): ThirdIncome.ca Know the risks and take action to protect your health  You are at higher risk of getting coronavirus disease if you are traveling to areas with an outbreak or if you are exposed to travelers from areas with an outbreak.  Wash your hands often and practice good hygiene to lower the risk of catching or spreading the virus. What should I do if I am sick? General instructions to stop the spread of infection  Wash your hands often with soap and water for at least 20 seconds. If soap and water are not available, use alcohol-based hand sanitizer.  Cough or sneeze into a tissue, sleeve, or elbow. Do not cough or sneeze into your hand or the air.  If you cough or sneeze into a tissue, throw it away immediately and wash your hands.  Stay home unless you must get medical care. Call your health care provider or local health authority before you get medical care.  Avoid public areas. Do not take public transportation, if possible.  If you can, wear a mask if you must go out of the house or if you are in close contact with someone who is not sick. Make sure your mask covers your nose and mouth. Keep your home clean  Disinfect objects and surfaces that are frequently touched every day. This may include: ? Counters and tables. ? Doorknobs and light  switches. ? Sinks and faucets. ? Electronics such as phones, remote controls, keyboards, computers, and tablets.  Wash dishes in hot, soapy water or use a dishwasher. Air-dry your dishes.  Wash laundry in hot water. Prevent infecting other household members  Let healthy household members care for children and pets, if possible. If you have to care for children or pets, wash your hands often and wear a mask.  Sleep in a different bedroom or bed, if possible.  Do not share personal items, such  as razors, toothbrushes, deodorant, combs, brushes, towels, and washcloths. Where to find more information Centers for Disease Control and Prevention (CDC)  Information and news updates: https://www.butler-gonzalez.com/ World Health Organization [facility])  Information and news updates: MissExecutive.com.ee  Coronavirus health topic: https://www.castaneda.info/  Questions and answers on COVID-19: OpportunityDebt.at  Global tracker: who.sprinklr.com American Academy of Pediatrics (AAP)  Information for families: www.healthychildren.org/English/health-issues/conditions/chest-lungs/Pages/2019-Novel-Coronavirus.aspx The coronavirus situation is changing rapidly. Check your local health authority website or the CDC and [facility] websites for updates and news. When should I contact a health care provider?  Contact your health care provider if you have symptoms of an infection, such as fever or cough, and you: ? Have been near anyone who is known to have coronavirus disease. ? Have come into contact with a person who is suspected to have coronavirus disease. ? Have traveled to an area where there is an outbreak of COVID-19. When should I get emergency medical care?  Get help right away by calling your local emergency services (911 in the [place] if you have: ? Trouble breathing. ? Pain or pressure in your chest. ? Confusion. ? Blue-tinged lips and fingernails. ? Difficulty waking from sleep. ? Symptoms that get worse. Let the emergency medical personnel know if you think you have coronavirus disease. Summary  A new respiratory virus is spreading from person to person and causing COVID-19 (coronavirus disease).  The virus that causes COVID-19 appears to spread easily. It spreads from one person to another through droplets from breathing, speaking, singing, coughing, or sneezing.  Older adults and those with chronic  diseases are at higher risk of disease. If you are at higher risk for complications, take extra precautions.  There is currently no vaccine to prevent coronavirus disease. There are no medicines, such as antibiotics or antivirals, to treat the virus.  You can protect yourself and your family by washing your hands often, avoiding touching your face, and covering your coughs and sneezes. This information is not intended to replace advice given to you by your health care provider. Make sure you discuss any questions you have with your health care provider. Document Revised: 09/18/2019 Document Reviewed: 03/17/2019 Elsevier Patient Education  [place]: Quarantine vs. Isolation QUARANTINE keeps someone who was in close contact with someone who has COVID-19 away from others. If you had close contact with a person who has COVID-19  Stay home until 14 days after your last contact.  Check your temperature twice a day and watch for symptoms of COVID-19.  If possible, stay away from people who are at higher-risk for getting very sick from COVID-19. ISOLATION keeps someone who is sick or tested positive for COVID-19 without symptoms away from others, even in their own home. If you are sick and think or know you have COVID-19  Stay home until after ? At least 10 days since symptoms first appeared and ? At least 24 hours with no fever without fever-reducing medication and ? Symptoms have improved If you  tested positive for COVID-19 but do not have symptoms  Stay home until after ? 10 days have passed since your positive test If you live with others, stay in a specific "sick room" or area and away from other people or animals, including pets. Use a separate bathroom, if available. michellinders.com 06/22/2019 This information is not intended to replace advice given to you by your health care provider. Make sure you discuss any questions you have with your health care  provider. Document Revised: 11/05/2019 Document Reviewed: 11/05/2019 Elsevier Patient Education  2020 [place] is a service that is designed to provide people who are terminally ill and their families with medical, spiritual, and psychological support. Its aim is to improve your quality of life by keeping you as comfortable as possible in the final stages of life. Who will be my providers when I begin hospice care? Hospice teams often include:  A nurse.  A doctor. The hospice doctor will be available for your care, but you can include your regular doctor or nurse practitioner.  A Education officer, museum.  A counselor.  A religious leader (such as a Clinical biochemist).  A dietitian.  Therapists.  Trained volunteers who can help with care. What services does hospice provide? Hospice services can vary depending on the center or organization. Generally, they include:  Ways to keep you comfortable, such as: ? Providing care in your home or in a home-like setting. ? Working with your family and friends to help meet your needs. ? Allowing you to enjoy the support of loved ones by receiving much of your basic care from family and friends.  Pain relief and symptom management. The staff will supply all necessary medicines and equipment so that you can stay comfortable and alert enough to enjoy the company of your friends and family.  Visits or care from a nurse and doctor. This may include 24-hour on-call services.  Companionship when you are alone.  Allowing you and your family to rest. Hospice staff may do light housekeeping, prepare meals, and run errands.  Counseling. They will make sure your emotional, spiritual, and social needs are being met, as well as those needs of your family members.  Spiritual care. This will be individualized to meet your needs and your family's needs. It may involve: ? Helping you and your family understand the dying process. ? Helping you say  goodbye to your family and friends. ? Performing a specific religious ceremony or ritual.  Massage.  Nutrition therapy.  Physical and occupational therapy.  Short-term inpatient care, if something cannot be managed in the home.  Art or music therapy.  Bereavement support for grieving family members. When should hospice care begin? Most people who use hospice are believed to have less than 6 months to live.  Your family and health care providers can help you decide when hospice services should begin.  If you live longer than 6 months but your condition does not improve, your doctor may be able to approve you for continued hospice care.  If your condition improves, you may discontinue the program. What should I consider before selecting a program? Most hospice programs are run by nonprofit, independent organizations. Some are affiliated with hospitals, nursing homes, or home health care agencies. Hospice programs can take place in your home or at a hospice center, hospital, or skilled nursing facility. When choosing a hospice program, ask the following questions:  What services are available to me?  What services will be offered to my  loved ones?  How involved will my loved ones be?  How involved will my health care provider be?  Who makes up the hospice care team? How are they trained or screened?  How will my pain and symptoms be managed?  If my circumstances change, can the services be provided in a different setting, such as my home or in the hospital?  Is the program reviewed and licensed by the state or certified in some other way?  What does it cost? Is it covered by insurance?  If I choose a hospice center or nursing home, where is the hospice center located? Is it convenient for family and friends?  If I choose a hospice center or nursing home, can my family and friends visit any time?  Will you provide emotional and spiritual support?  Who can my family call  with questions? Where can I learn more about hospice? You can learn about existing hospice programs in your area from your health care providers. You can also read more about hospice online. The websites of the following organizations have helpful information:  [facility] and Palliative Care Organization [facility]): http://www.brown-buchanan.com/  National Association for [facility]): http://massey-hart.com/  Hospice Foundation of [place] ([place]): www.hospicefoundation.org  American Cancer Society (ACS): www.cancer.org  Hospice Net: www.hospicenet.org  Visiting Nurse Associations of [place] (VNAA): www.vnaa.org You may also find more information by contacting the following agencies:  A local agency on aging.  Your local [employer] chapter.  Your state's department of health or social services. Summary  Hospice is a service that is designed to provide people who are terminally ill and their families with medical, spiritual, and psychological support.  Hospice aims to improve your quality of life by keeping you as comfortable as possible in the final stages of life.  Hospice teams often include a doctor, nurse, social worker, counselor, religious leader,dietitian, therapists, and volunteers.  Hospice care generally includes medicine for symptom management, visits from doctors and nurses, physical and occupational therapy, nutrition counseling, spiritual and emotional counseling, caregiver support, and bereavement support for grieving family members.  Hospice programs can take place in your home or at a hospice center, hospital, or skilled nursing facility. This information is not intended to replace advice given to you by your health care provider. Make sure you discuss any questions you have with your health care provider. Document Revised: 08/12/2019 Document Reviewed: 12/11/2016 Elsevier Patient Education  [place].

## 2020-01-21 NOTE — Progress Notes (Signed)
Report called and received by Wilfred Lacy at [facility].

## 2020-01-21 NOTE — Progress Notes (Signed)
Called and updated patient's son (Marshall) on patient's status. Transferred call into room so that patient could speak with her son.  

## 2020-01-21 NOTE — NC FL2 (Signed)
[place] LEVEL OF CARE SCREENING TOOL     IDENTIFICATION  Patient Name: Virginia Barnes Birthdate: [birth year] Sex: female Admission Date (Current Location): 01/18/2020  [place] and [place] Number:  Engineering geologist and Address:  [facility], [address] [place], [place] [address]      Provider Number: [id number]  Attending Physician Name and Address:  Max Sane, MD  Relative Name and Phone Number:  Makhia Vosler [phone]    Current Level of Care: Hospital Recommended Level of Care: [place] Prior Approval Number:    Date Approved/Denied:   PASRR Number:    Discharge Plan: Other (Comment)(Assisted Living Facility)    Current Diagnoses: Patient Active Problem List   Diagnosis Date Noted  . Dehydration   . Hypernatremia 01/18/2020  . Hyperkalemia 01/18/2020  . COVID-19 virus infection 01/18/2020  . Hematoma 07/25/2018  . Frequent falls 07/15/2018  . Imbalance 07/15/2018  . [place] pulmonary embolus ([place]) 05/31/2018  . Chronic diastolic heart failure ([place]) 02/17/2018  . HTN (hypertension) 02/17/2018  . PAF (paroxysmal atrial fibrillation) ([place]) 02/17/2018  . Lymphedema 02/17/2018  . Diabetes ([place]) 02/17/2018  . Sepsis ([place]) 01/30/2018  . Hypoglycemia 01/12/2018  . Knee pain 05/17/2017    Orientation RESPIRATION BLADDER Height & Weight     Self  Normal Incontinent Weight: 96 kg Height:  5\' 9"  (175.3 cm)  BEHAVIORAL SYMPTOMS/MOOD NEUROLOGICAL BOWEL NUTRITION STATUS      Incontinent Diet(Dysphagia diet 1- thin liquids)  AMBULATORY STATUS COMMUNICATION OF NEEDS Skin   Extensive Assist Verbally PU Stage and Appropriate Care(Right buttock, foam dressing)   PU Stage 2 Dressing: Daily                   Personal Care Assistance Level of Assistance  Bathing, Feeding, Dressing Bathing Assistance: Maximum assistance Feeding assistance: Maximum assistance Dressing Assistance: Maximum assistance      Functional Limitations Info  Sight Sight Info: Impaired        SPECIAL CARE FACTORS FREQUENCY                       Contractures Contractures Info: Not present    Additional Factors Info  Code Status, Allergies Code Status Info: Full Allergies Info: Peanuts, lasix, lisinopril           Current Medications (01/21/2020):  This is the current hospital active medication list Current Facility-Administered Medications  Medication Dose Route Frequency Provider Last Rate Last Admin  . acetaminophen (TYLENOL) tablet 650 mg  650 mg Oral Q6H PRN Elwyn Reach, MD       Or  . acetaminophen (TYLENOL) suppository 650 mg  650 mg Rectal Q6H PRN Elwyn Reach, MD      . ascorbic acid (VITAMIN C) tablet 250 mg  250 mg Oral BID Fritzi Mandes, MD   250 mg at 01/20/20 2254  . calcium-vitamin D (OSCAL WITH D) 500-200 MG-UNIT per tablet 1 tablet  1 tablet Oral BID Elwyn Reach, MD   1 tablet at 01/20/20 2255  . cefTRIAXone (ROCEPHIN) 1 g in sodium chloride 0.9 % 100 mL IVPB  1 g Intravenous Q24H Sharion Settler, NP 200 mL/hr at 01/21/20 0514 1 g at 01/21/20 0514  . cycloSPORINE (RESTASIS) 0.05 % ophthalmic emulsion 1 drop  1 drop Both Eyes Q12H Elwyn Reach, MD   1 drop at 01/21/20 0849  . dextrose 5 % solution   Intravenous Continuous Fritzi Mandes, MD 125  mL/hr at 01/20/20 2257 New Bag at 01/20/20 2257  . feeding supplement (NEPRO CARB STEADY) liquid 237 mL  237 mL Oral TID BM Enedina Finner, MD 0 mL/hr at 01/19/20 2216 237 mL at 01/20/20 2246  . ferrous sulfate tablet 325 mg  325 mg Oral BID WC Rometta Emery, MD   325 mg at 01/19/20 1824  . fesoterodine (TOVIAZ) tablet 8 mg  8 mg Oral Daily Enedina Finner, MD      . fluorometholone (FML) 0.1 % ophthalmic suspension 1 drop  1 drop Right Eye BID Rometta Emery, MD   1 drop at 01/21/20 0849  . hydrALAZINE (APRESOLINE) injection 10 mg  10 mg Intravenous Q6H PRN Manuela Schwartz, NP      . hydrALAZINE (APRESOLINE) tablet 25 mg   25 mg Oral BID Enedina Finner, MD   25 mg at 01/20/20 1205  . hyoscyamine (LEVSIN SL) SL tablet 0.125 mg  0.125 mg Sublingual Q4H PRN Garba, Mohammad L, MD      . insulin aspart (novoLOG) injection 0-5 Units  0-5 Units Subcutaneous QHS Garba, Mohammad L, MD      . insulin aspart (novoLOG) injection 0-9 Units  0-9 Units Subcutaneous TID WC Rometta Emery, MD   5 Units at 01/21/20 1210  . insulin detemir (LEVEMIR) injection 8 Units  8 Units Subcutaneous Daily Delfino Lovett, MD   8 Units at 01/21/20 1209  . ipratropium (ATROVENT) 0.03 % nasal spray 2 spray  2 spray Each Nare QID PRN Enedina Finner, MD      . loratadine (CLARITIN) tablet 10 mg  10 mg Oral Daily Rometta Emery, MD   10 mg at 01/19/20 1239  . multivitamin with minerals tablet 1 tablet  1 tablet Oral Daily Enedina Finner, MD      . ondansetron [facility]) tablet 4 mg  4 mg Oral Q6H PRN Rometta Emery, MD       Or  . ondansetron (ZOFRAN) injection 4 mg  4 mg Intravenous Q6H PRN Earlie Lou L, MD      . oxybutynin (DITROPAN-XL) 24 hr tablet 5 mg  5 mg Oral Q0600 Rometta Emery, MD   5 mg at 01/21/20 0517  . pantoprazole (PROTONIX) EC tablet 40 mg  40 mg Oral Daily Rometta Emery, MD   40 mg at 01/21/20 0849  . rivaroxaban (XARELTO) tablet 20 mg  20 mg Oral Q supper Rometta Emery, MD   20 mg at 01/20/20 1900  . rosuvastatin (CRESTOR) tablet 20 mg  20 mg Oral QPM Earlie Lou L, MD   20 mg at 01/20/20 1900  . sertraline (ZOLOFT) tablet 100 mg  100 mg Oral Daily Earlie Lou L, MD   100 mg at 01/21/20 1209  . sotalol (BETAPACE) tablet 80 mg  80 mg Oral BID Rometta Emery, MD   80 mg at 01/21/20 0846  . valACYclovir (VALTREX) tablet 1,000 mg  1,000 mg Oral Daily Rometta Emery, MD   1,000 mg at 01/21/20 0846     Discharge Medications: Medication List    STOP taking these medications   HYDROcodone-acetaminophen 5-325 MG tablet Commonly known as: NORCO/VICODIN     TAKE these medications   ascorbic acid 250 MG  tablet Commonly known as: VITAMIN C Take 1 tablet (250 mg total) by mouth daily.   Calcium 600/Vitamin D 600-400 MG-UNIT Tabs Generic drug: Calcium Carbonate-Vitamin D3 Take 1 tablet by mouth 2 (two) times daily.   cetirizine 10  MG tablet Commonly known as: ZYRTEC Take 10 mg by mouth daily.   cycloSPORINE 0.05 % ophthalmic emulsion Commonly known as: RESTASIS Place 1 drop into both eyes every 12 (twelve) hours.   diclofenac Sodium 1 % Gel Commonly known as: VOLTAREN Apply 4 g topically 2 (two) times daily.   feeding supplement (NEPRO CARB STEADY) Liqd Take 237 mLs by mouth 3 (three) times daily between meals.   ferrous sulfate 325 (65 FE) MG tablet Take 1 tablet (325 mg total) by mouth 2 (two) times daily with a meal.   fesoterodine 8 MG Tb24 tablet Commonly known as: TOVIAZ Take 8 mg by mouth daily.   fluorometholone 0.1 % ophthalmic suspension Commonly known as: FML Place 1 drop into the right eye 2 (two) times daily.   fluticasone 50 MCG/ACT nasal spray Commonly known as: FLONASE Place 2 sprays into both nostrils daily.   furosemide 40 MG tablet Commonly known as: Lasix Take 0.5 tablets (20 mg total) by mouth daily.   hydrALAZINE 25 MG tablet Commonly known as: APRESOLINE Take 25 mg by mouth 2 (two) times daily.   hyoscyamine 0.125 MG SL tablet Commonly known as: LEVSIN SL Place 0.125 mg under the tongue every 4 (four) hours as needed (secretions).   insulin lispro 100 UNIT/ML injection Commonly known as: HUMALOG Inject 0-10 Units into the skin 3 (three) times daily before meals. Sliding scale 0-150, give 0 units. 151-200 give 2 units. 201-250 give 4 units. 251-300 give 6 units. 301-350 give 8 units. 351-400 give 10 units. If Blood Sugar is greater than 400, call MD   ipratropium 0.03 % nasal spray Commonly known as: ATROVENT Place 2 sprays into both nostrils 3 (three) times daily as needed for rhinitis.   NovoLOG FlexPen 100 UNIT/ML  FlexPen Generic drug: insulin aspart Inject 0-10 Units into the skin 3 (three) times daily with meals. <60 Call MD 151-200 - 2 units 201-250 4 units 251-300 - 6 units 301-350 - 8 units 351-400 - 10 units >400 Call MD   omeprazole 40 MG capsule Commonly known as: PRILOSEC Take 1 capsule by mouth daily.   oxybutynin 5 MG 24 hr tablet Commonly known as: DITROPAN-XL Take 5 mg by mouth daily at 6 (six) AM.   potassium chloride 10 MEQ tablet Commonly known as: KLOR-CON Take 10 mEq by mouth daily.   prednisoLONE acetate 1 % ophthalmic suspension Commonly known as: PRED FORTE Place 1 drop into both eyes 2 (two) times daily.   rivaroxaban 20 MG Tabs tablet Commonly known as: XARELTO Take 1 tablet (20 mg total) by mouth daily.   rosuvastatin 20 MG tablet Commonly known as: CRESTOR Take 20 mg by mouth every evening.   sertraline 100 MG tablet Commonly known as: ZOLOFT Take 100 mg by mouth daily.   sotalol 80 MG tablet Commonly known as: BETAPACE Take 80 mg by mouth 2 (two) times daily.   valACYclovir 1000 MG tablet Commonly known as: VALTREX Take 1,000 mg by mouth daily.     Relevant Imaging Results:  Relevant Lab Results:   Additional Information SS#[id number]  Allayne Butcher, RN

## 2020-01-21 NOTE — Plan of Care (Signed)
  Problem: Education: Goal: Knowledge of General Education information will improve Description: Including pain rating scale, medication(s)/side effects and non-pharmacologic comfort measures Outcome: Adequate for Discharge   Problem: Health Behavior/Discharge Planning: Goal: Ability to manage health-related needs will improve Outcome: Adequate for Discharge   Problem: Clinical Measurements: Goal: Ability to maintain clinical measurements within normal limits will improve Outcome: Adequate for Discharge Goal: Will remain free from infection Outcome: Adequate for Discharge Goal: Diagnostic test results will improve Outcome: Adequate for Discharge Goal: Respiratory complications will improve Outcome: Adequate for Discharge Goal: Cardiovascular complication will be avoided Outcome: Adequate for Discharge   Problem: Activity: Goal: Risk for activity intolerance will decrease Outcome: Adequate for Discharge   Problem: Nutrition: Goal: Adequate nutrition will be maintained Outcome: Adequate for Discharge   Problem: Coping: Goal: Level of anxiety will decrease Outcome: Adequate for Discharge   Problem: Elimination: Goal: Will not experience complications related to bowel motility Outcome: Adequate for Discharge Goal: Will not experience complications related to urinary retention Outcome: Adequate for Discharge   Problem: Pain Managment: Goal: General experience of comfort will improve Outcome: Adequate for Discharge   Problem: Safety: Goal: Ability to remain free from injury will improve Outcome: Adequate for Discharge   Problem: Skin Integrity: Goal: Risk for impaired skin integrity will decrease Outcome: Adequate for Discharge   Problem: Education: Goal: Ability to verbalize understanding of causes of urinary incontinence will improve Outcome: Adequate for Discharge Goal: Knowledge of measures to decrease incidence of urinary incontinence will improve Outcome:  Adequate for Discharge   Problem: Coping: Goal: Ability to verbalize feelings about incontinence will improve Outcome: Adequate for Discharge   Problem: Urinary Elimination: Goal: Pelvic muscle control will improve Outcome: Adequate for Discharge   Problem: Skin Integrity: Goal: Risk for impaired skin integrity will decrease Outcome: Adequate for Discharge   Problem: Urinary Elimination: Goal: Signs and symptoms of infection will decrease Outcome: Adequate for Discharge   Problem: Education: Goal: Knowledge of risk factors and measures for prevention of condition will improve Outcome: Adequate for Discharge   Problem: Coping: Goal: Psychosocial and spiritual needs will be supported Outcome: Adequate for Discharge   Problem: Respiratory: Goal: Will maintain a patent airway Outcome: Adequate for Discharge Goal: Complications related to the disease process, condition or treatment will be avoided or minimized Outcome: Adequate for Discharge

## 2020-02-01 ENCOUNTER — Other Ambulatory Visit: Payer: Self-pay | Admitting: Internal Medicine

## 2020-02-01 DIAGNOSIS — U071 COVID-19: Secondary | ICD-10-CM

## 2020-02-17 ENCOUNTER — Other Ambulatory Visit: Payer: Self-pay | Admitting: Internal Medicine

## 2020-03-02 ENCOUNTER — Emergency Department
Admission: EM | Admit: 2020-03-02 | Discharge: 2020-03-02 | Disposition: A | Discharge status: Home / Self Care | Payer: Medicare Other | Attending: Emergency Medicine | Admitting: Emergency Medicine

## 2020-03-02 ENCOUNTER — Emergency Department: Payer: Medicare Other

## 2020-03-02 ENCOUNTER — Encounter: Payer: Self-pay | Admitting: Emergency Medicine

## 2020-03-02 DIAGNOSIS — I5032 Chronic diastolic (congestive) heart failure: Secondary | ICD-10-CM | POA: Diagnosis not present

## 2020-03-02 DIAGNOSIS — M25561 Pain in right knee: Secondary | ICD-10-CM | POA: Diagnosis not present

## 2020-03-02 DIAGNOSIS — W19XXXA Unspecified fall, initial encounter: Secondary | ICD-10-CM

## 2020-03-02 DIAGNOSIS — Y999 Unspecified external cause status: Secondary | ICD-10-CM | POA: Insufficient documentation

## 2020-03-02 DIAGNOSIS — Z91010 Allergy to peanuts: Secondary | ICD-10-CM | POA: Insufficient documentation

## 2020-03-02 DIAGNOSIS — Y939 Activity, unspecified: Secondary | ICD-10-CM | POA: Insufficient documentation

## 2020-03-02 DIAGNOSIS — Z87891 Personal history of nicotine dependence: Secondary | ICD-10-CM | POA: Insufficient documentation

## 2020-03-02 DIAGNOSIS — I480 Paroxysmal atrial fibrillation: Secondary | ICD-10-CM | POA: Diagnosis not present

## 2020-03-02 DIAGNOSIS — W010XXA Fall on same level from slipping, tripping and stumbling without subsequent striking against object, initial encounter: Secondary | ICD-10-CM | POA: Diagnosis not present

## 2020-03-02 DIAGNOSIS — E119 Type 2 diabetes mellitus without complications: Secondary | ICD-10-CM | POA: Diagnosis not present

## 2020-03-02 DIAGNOSIS — I110 Hypertensive heart disease with heart failure: Secondary | ICD-10-CM | POA: Insufficient documentation

## 2020-03-02 DIAGNOSIS — Z7901 Long term (current) use of anticoagulants: Secondary | ICD-10-CM | POA: Diagnosis not present

## 2020-03-02 DIAGNOSIS — Z8616 Personal history of COVID-19: Secondary | ICD-10-CM | POA: Insufficient documentation

## 2020-03-02 DIAGNOSIS — N390 Urinary tract infection, site not specified: Secondary | ICD-10-CM | POA: Diagnosis not present

## 2020-03-02 DIAGNOSIS — Y92129 Unspecified place in nursing home as the place of occurrence of the external cause: Secondary | ICD-10-CM | POA: Insufficient documentation

## 2020-03-02 DIAGNOSIS — R410 Disorientation, unspecified: Secondary | ICD-10-CM | POA: Diagnosis present

## 2020-03-02 DIAGNOSIS — Z794 Long term (current) use of insulin: Secondary | ICD-10-CM | POA: Diagnosis not present

## 2020-03-02 LAB — CBC
HCT: 44 % (ref 36.0–46.0)
Hemoglobin: 13.5 g/dL (ref 12.0–15.0)
MCH: 30.3 pg (ref 26.0–34.0)
MCHC: 30.7 g/dL (ref 30.0–36.0)
MCV: 98.9 fL (ref 80.0–100.0)
Platelets: 224 10*3/uL (ref 150–400)
RBC: 4.45 MIL/uL (ref 3.87–5.11)
RDW: 14.7 % (ref 11.5–15.5)
WBC: 5.3 10*3/uL (ref 4.0–10.5)
nRBC: 0 % (ref 0.0–0.2)

## 2020-03-02 LAB — COMPREHENSIVE METABOLIC PANEL
ALT: 14 U/L (ref 0–44)
AST: 23 U/L (ref 15–41)
Albumin: 3.2 g/dL — ABNORMAL LOW (ref 3.5–5.0)
Alkaline Phosphatase: 62 U/L (ref 38–126)
Anion gap: 8 (ref 5–15)
BUN: 8 mg/dL (ref 8–23)
CO2: 27 mmol/L (ref 22–32)
Calcium: 9.1 mg/dL (ref 8.9–10.3)
Chloride: 105 mmol/L (ref 98–111)
Creatinine, Ser: 0.83 mg/dL (ref 0.44–1.00)
GFR calc Af Amer: >60 mL/min (ref >60)
GFR calc non Af Amer: >60 mL/min (ref >60)
Glucose, Bld: 136 mg/dL — ABNORMAL HIGH (ref 70–99)
Potassium: 4.6 mmol/L (ref 3.5–5.1)
Sodium: 140 mmol/L (ref 135–145)
Total Bilirubin: 0.9 mg/dL (ref 0.3–1.2)
Total Protein: 6.5 g/dL (ref 6.5–8.1)

## 2020-03-02 LAB — URINALYSIS, COMPLETE (UACMP) WITH MICROSCOPIC
Bilirubin Urine: NEGATIVE
Glucose, UA: NEGATIVE mg/dL
Ketones, ur: NEGATIVE mg/dL
Nitrite: NEGATIVE
Protein, ur: NEGATIVE mg/dL
Specific Gravity, Urine: 1.012 (ref 1.005–1.030)
WBC, UA: >50 WBC/hpf — ABNORMAL HIGH (ref 0–5)
pH: 5 (ref 5.0–8.0)

## 2020-03-02 LAB — TROPONIN I (HIGH SENSITIVITY): Troponin I (High Sensitivity): 10 ng/L (ref <18)

## 2020-03-02 MED ORDER — FOSFOMYCIN TROMETHAMINE 3 G PO PACK
3.0000 g | PACK | Freq: Once | ORAL | Status: AC
  Administered 2020-03-02: 3 g via ORAL
  Filled 2020-03-02: qty 3

## 2020-03-02 NOTE — ED Notes (Signed)
Report given to Bintou, Med tech for patient, per Bintou, pt is wheelchair bound, requesting patient be brought back by EMS due to no transport available at facility.

## 2020-03-02 NOTE — Discharge Instructions (Addendum)
Patient's work-up is essentially normal besides a mild UTI which has been treated with a one-time dose of antibiotics.  No further antibiotics are needed.

## 2020-03-02 NOTE — ED Triage Notes (Signed)
Pt arrived from [facility], post "roll" out of bed. Pt c/o right knee pain. Staff at facility reports pt has had increased confusion from baseline, "latley." Right knee is swollen on assessment.

## 2020-03-02 NOTE — ED Notes (Signed)
NAD noted at time of D/C. Pt D/C back to facility via [facility] at this time.

## 2020-03-02 NOTE — ED Provider Notes (Signed)
-----------------------------------------   8:40 AM on 03/02/2020 -----------------------------------------  Patient care assumed from Dr. Manson Passey.  Patient's work-up is essentially negative besides a mild UTI.  Patient has been treated with fosfomycin.  Urine culture has been sent.  We will discharge patient back to her living facility.   Minna Antis, MD 03/02/20 [phone]

## 2020-03-02 NOTE — ED Notes (Signed)
Pt returned from CT, pt noted to be resting in bed with NAD noted at this time, eyes closed, respirations even and unlabored, lights dimmed for patient comfort at this time.

## 2020-03-02 NOTE — ED Notes (Signed)
[facility]  CALLED  FOR  TRANSPORT   TO  [place]  [facility]SE 

## 2020-03-02 NOTE — ED Provider Notes (Signed)
[facility] Emergency Department Provider Note  ____________________________________________   First MD Initiated Contact with Patient 03/02/20 [phone]     (approximate)  I have reviewed the triage vital signs and the nursing notes.   HISTORY  Chief Complaint Fall   HPI Virginia Barnes is a 73 y.o. female with below list of previous medical conditions presents emergency department via EMS from [facility] secondary to a fall.  EMS states that they were notified that the patient "rolled out of bed.  Patient admits to the same.  Patient does admit to right knee pain following the fall.  Staff at the nursing facility states that the patient seemed confused in comparison to her baseline.  Patient denies any other complaints other than knee pain.        Past Medical History:  Diagnosis Date  . Arrhythmia    atrial fibrillation  . CHF (congestive heart failure) ([place])   . Diabetes mellitus without complication ([place])   . Diverticulitis   . Frequent falls   . Hypertension     Patient Active Problem List   Diagnosis Date Noted  . Failure to thrive in adult   . Goals of care, counseling/discussion   . Palliative care by specialist   . DNR (do not resuscitate)   . Dehydration   . Hypernatremia 01/18/2020  . Hyperkalemia 01/18/2020  . COVID-19 virus infection 01/18/2020  . Hematoma 07/25/2018  . Frequent falls 07/15/2018  . Imbalance 07/15/2018  . [place] pulmonary embolus ([place]) 05/31/2018  . Chronic diastolic heart failure ([place]) 02/17/2018  . HTN (hypertension) 02/17/2018  . PAF (paroxysmal atrial fibrillation) ([place]) 02/17/2018  . Lymphedema 02/17/2018  . Diabetes ([place]) 02/17/2018  . Sepsis ([place]) 01/30/2018  . Hypoglycemia 01/12/2018  . Knee pain 05/17/2017    Past Surgical History:  Procedure Laterality Date  . ABDOMINAL HYSTERECTOMY    . CARDIAC CATHETERIZATION Left 04/13/2016   Procedure: Left Heart Cath and Coronary Angiography;  Surgeon:  Dionisio David, MD;  Location: [place] [facility];  Service: Cardiovascular;  Laterality: Left;  . JOINT REPLACEMENT      Prior to Admission medications   Medication Sig Start Date End Date Taking? Authorizing Provider  ascorbic acid (VITAMIN C) 250 MG tablet Take 1 tablet (250 mg total) by mouth daily. 01/21/20   Max Sane, MD  Calcium Carbonate-Vitamin D3 (CALCIUM 600/VITAMIN D) 600-400 MG-UNIT TABS Take 1 tablet by mouth 2 (two) times daily.    [provider]  cetirizine (ZYRTEC) 10 MG tablet Take 10 mg by mouth daily.     [provider]  cycloSPORINE (RESTASIS) 0.05 % ophthalmic emulsion Place 1 drop into both eyes every 12 (twelve) hours.    [provider]  diclofenac Sodium (VOLTAREN) 1 % GEL Apply 4 g topically 2 (two) times daily.    [provider]  ferrous sulfate 325 (65 FE) MG tablet Take 1 tablet (325 mg total) by mouth 2 (two) times daily with a meal. 07/28/18   Gladstone Lighter, MD  fesoterodine (TOVIAZ) 8 MG TB24 tablet Take 8 mg by mouth daily.    [provider]  fluorometholone (FML) 0.1 % ophthalmic suspension Place 1 drop into the right eye 2 (two) times daily.    [provider]  fluticasone (FLONASE) 50 MCG/ACT nasal spray Place 2 sprays into both nostrils daily.    [provider]  furosemide (LASIX) 40 MG tablet Take 0.5 tablets (20 mg total) by mouth daily. 07/28/18 01/18/20  Enid Baas, MD  hydrALAZINE (APRESOLINE) 25 MG tablet Take 25 mg by mouth 2 (two) times daily.    [provider]  hyoscyamine (LEVSIN SL) 0.125 MG SL tablet Place 0.125 mg under the tongue every 4 (four) hours as needed (secretions).    [provider]  insulin lispro (HUMALOG) 100 UNIT/ML injection Inject 0-10 Units into the skin 3 (three) times daily before meals. Sliding scale 0-150, give 0 units. 151-200 give 2 units. 201-250 give 4 units. 251-300 give 6 units. 301-350 give 8 units. 351-400 give 10  units. If Blood Sugar is greater than 400, call MD    [provider]  ipratropium (ATROVENT) 0.03 % nasal spray Place 2 sprays into both nostrils 3 (three) times daily as needed for rhinitis.    [provider]  NOVOLOG FLEXPEN 100 UNIT/ML FlexPen Inject 0-10 Units into the skin 3 (three) times daily with meals. <60 Call MD 151-200 - 2 units 201-250 4 units 251-300 - 6 units 301-350 - 8 units 351-400 - 10 units >400 Call MD 05/23/18   [provider]  Nutritional Supplements (FEEDING SUPPLEMENT, NEPRO CARB STEADY,) LIQD Take 237 mLs by mouth 3 (three) times daily between meals. 01/21/20   Delfino Lovett, MD  omeprazole (PRILOSEC) 40 MG capsule Take 1 capsule by mouth daily. 09/26/15   [provider]  oxybutynin (DITROPAN-XL) 5 MG 24 hr tablet Take 5 mg by mouth daily at 6 (six) AM.    [provider]  potassium chloride (K-DUR,KLOR-CON) 10 MEQ tablet Take 10 mEq by mouth daily. 05/09/18   [provider]  prednisoLONE acetate (PRED FORTE) 1 % ophthalmic suspension Place 1 drop into both eyes 2 (two) times daily.     [provider]  rivaroxaban (XARELTO) 20 MG TABS tablet Take 1 tablet (20 mg total) by mouth daily. 07/28/18   Enid Baas, MD  rosuvastatin (CRESTOR) 20 MG tablet Take 20 mg by mouth every evening.     [provider]  sertraline (ZOLOFT) 100 MG tablet Take 100 mg by mouth daily.    [provider]  sotalol (BETAPACE) 80 MG tablet Take 80 mg by mouth 2 (two) times daily.    [provider]  valACYclovir (VALTREX) 1000 MG tablet Take 1,000 mg by mouth daily.    [provider]    Allergies Peanuts [peanut oil], Lasix [furosemide], and Lisinopril  Family History  Problem Relation Age of Onset  . Stomach cancer Sister 31  . Vaginal cancer Sister 70  . Heart disease Father   . Breast cancer Neg Hx     Social History Social History   Tobacco Use  . Smoking status: Former  Smoker    Packs/day: 0.50    Years: 15.00    Pack years: 7.50    Types: Cigarettes  . Smokeless tobacco: Never Used  Substance Use Topics  . Alcohol use: No  . Drug use: No    Review of Systems Constitutional: No fever/chills Eyes: No visual changes. ENT: No sore throat. Cardiovascular: Denies chest pain. Respiratory: Denies shortness of breath. Gastrointestinal: No abdominal pain.  No nausea, no vomiting.  No diarrhea.  No constipation. Genitourinary: Negative for dysuria. Musculoskeletal: Negative for neck pain.  Negative for back pain.  Positive for right knee pain Integumentary: Negative for rash. Neurological: Negative for headaches, focal weakness or numbness. ____________________   PHYSICAL EXAM:  VITAL SIGNS: ED Triage Vitals  Enc Vitals Group     BP 03/02/20 0632 139/61  Pulse --      Resp --      Temp 03/02/20 0629 98.5 F (36.9 C)     Temp Source 03/02/20 0629 Oral     SpO2 --      Weight --      Height --      Head Circumference --      Peak Flow --      Pain Score --      Pain Loc --      Pain Edu? --      Excl. in GC? --      Constitutional: Alert and oriented.  Eyes: Conjunctivae are normal.  Head: Atraumatic. Mouth/Throat: Patient is wearing a mask. Neck: No stridor.  No meningeal signs.   Cardiovascular: Normal rate, regular rhythm. Good peripheral circulation. Grossly normal heart sounds. Respiratory: Normal respiratory effort.  No retractions. Gastrointestinal: Soft and nontender. No distention.   Musculoskeletal: Pain with active and passive range of motion of the right knee.  No gross deformities of extremities. Neurologic:  Normal speech and language. No gross focal neurologic deficits are appreciated.  Skin:  Skin is warm, dry and intact. Psychiatric: Mood and affect are normal. Speech and behavior are normal.  ____________________________________________   LABS (all labs ordered are listed, but only abnormal results are  displayed)  Labs Reviewed  CBC  COMPREHENSIVE METABOLIC PANEL  URINALYSIS, COMPLETE (UACMP) WITH MICROSCOPIC  TROPONIN I (HIGH SENSITIVITY)   ____________________________________________  EKG  ED ECG REPORT I, Williford N Leatrice Parilla, the attending physician, personally viewed and interpreted this ECG.   Date: 03/02/2020  EKG Time: 6:35 AM  Rate: 71  Rhythm: Normal sinus rhythm  Axis: Normal  Intervals: Normal  ST&T Change: None _____________  Procedures   ____________________________________________   INITIAL IMPRESSION / MDM / ASSESSMENT AND PLAN / ED COURSE  As part of my medical decision making, I reviewed the following data within the electronic MEDICAL RECORD NUMBER   74 year old female presented with above-stated history and physical exam secondary to fall.  Right knee x-ray and CT scan of the head pending as well as CBC CMP and troponin.  Urinalysis did reveal evidence of a urinary tract infection for which the patient was given fosfomycin.  Patient's care transferred to Dr. Lenard Lance       ____________________________________________  FINAL CLINICAL IMPRESSION(S) / ED DIAGNOSES  Final diagnoses:  Fall, initial encounter  Lower urinary tract infectious disease     MEDICATIONS GIVEN DURING THIS VISIT:  Medications - No data to display   ED Discharge Orders    None      *Please note:  Virginia Barnes was evaluated in Emergency Department on 03/02/2020 for the symptoms described in the history of present illness. She was evaluated in the context of the global COVID-19 pandemic, which necessitated consideration that the patient might be at risk for infection with the SARS-CoV-2 virus that causes COVID-19. Institutional protocols and algorithms that pertain to the evaluation of patients at risk for COVID-19 are in a state of rapid change based on information released by regulatory bodies including the CDC and federal and state organizations. These policies and  algorithms were followed during the patient's care in the ED.  Some ED evaluations and interventions may be delayed as a result of limited staffing during the pandemic.*  Note:  This document was prepared using Dragon voice recognition software and may include unintentional dictation errors.   Darci Current, MD 03/02/20 2250

## 2020-03-02 NOTE — ED Notes (Signed)
Pt visualized in NAD at this time. Pt continues to rest in bed with personal cell phone at beside, call bell within reach. Pt continues to rest watching TV.

## 2020-03-02 NOTE — ED Notes (Signed)
This RN to bedside, pt continues to rest in bed with NAD noted at this time, medication administered per MD order. Pt noted to be alert, on her personal cell phone at bedside at this time. VSS at this time. Pt currently resting in bed watching TV at this time.

## 2020-03-02 NOTE — ED Notes (Signed)
This RN to bedside, explained to patient awaiting [facility] arrival to transport back to facility. Pt states understanding, denies any needs. Call bell remains within reach at this time.

## 2020-03-03 LAB — URINE CULTURE: Culture: NO GROWTH

## 2020-03-25 IMAGING — MG DIGITAL SCREENING BILATERAL MAMMOGRAM WITH TOMO AND CAD
8 of 14 series · 8 of 40 positions shown · non-contrast
Comparison: Previous exam(s).

CLINICAL DATA: Screening.

EXAM:
DIGITAL SCREENING BILATERAL MAMMOGRAM WITH TOMO AND CAD

[R MLO synth-2D (1 of 2)]
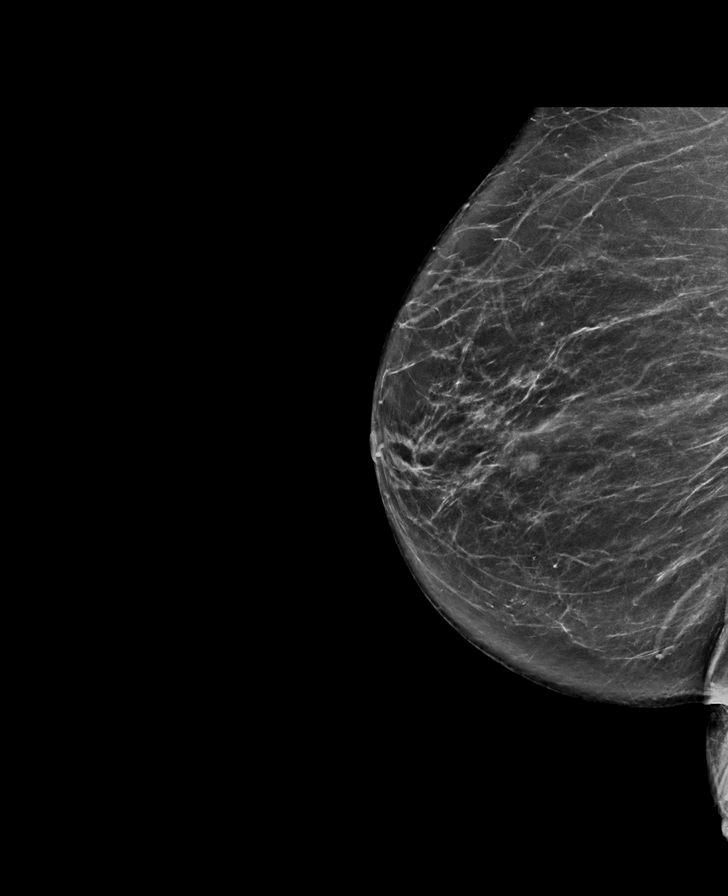

[R MLO synth-2D (2 of 2)]
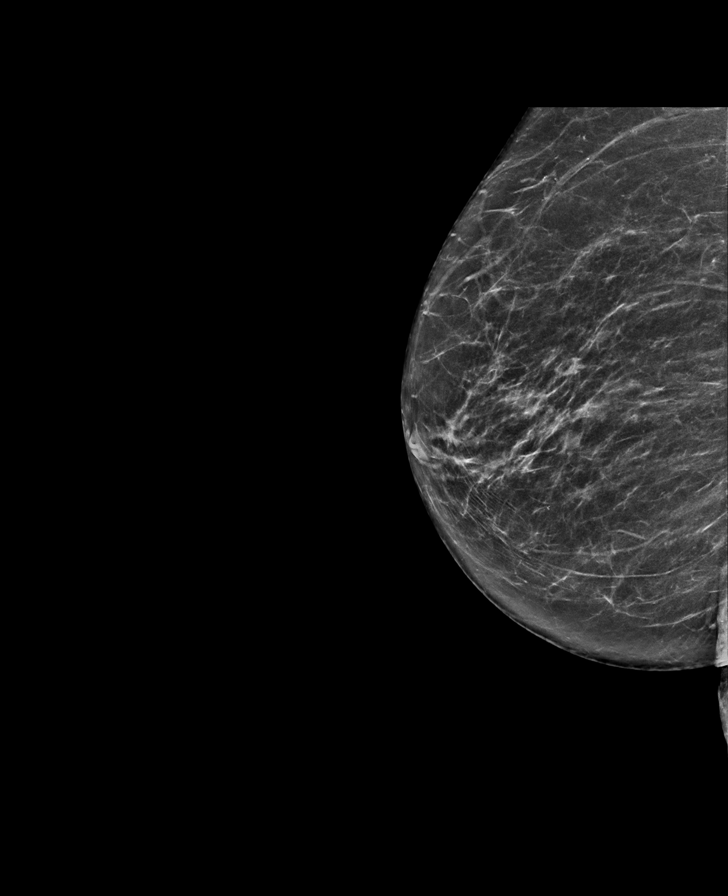

[R CC synth-2D]
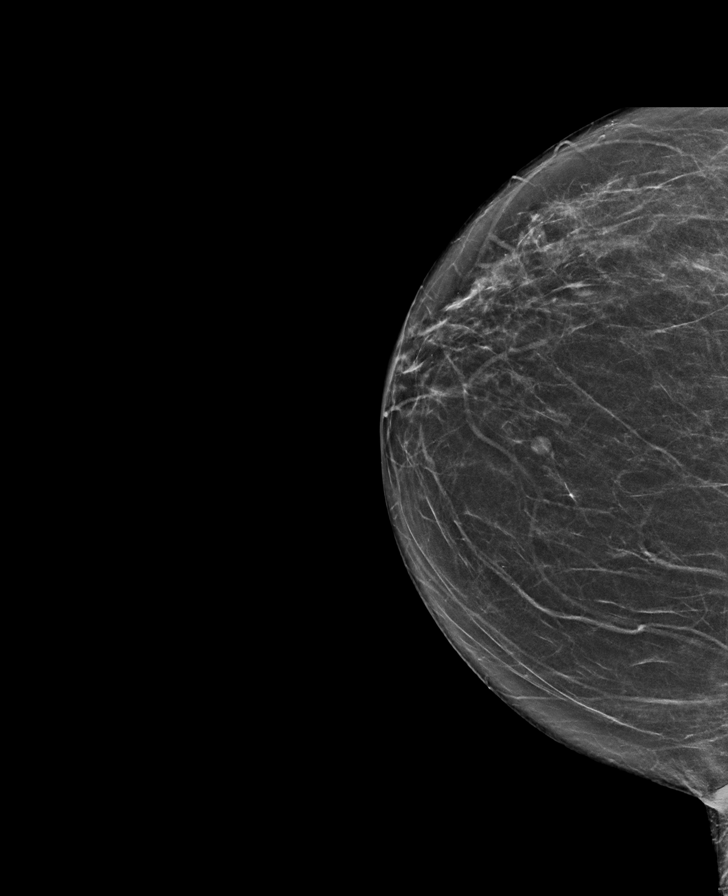

[R XCCL synth-2D]
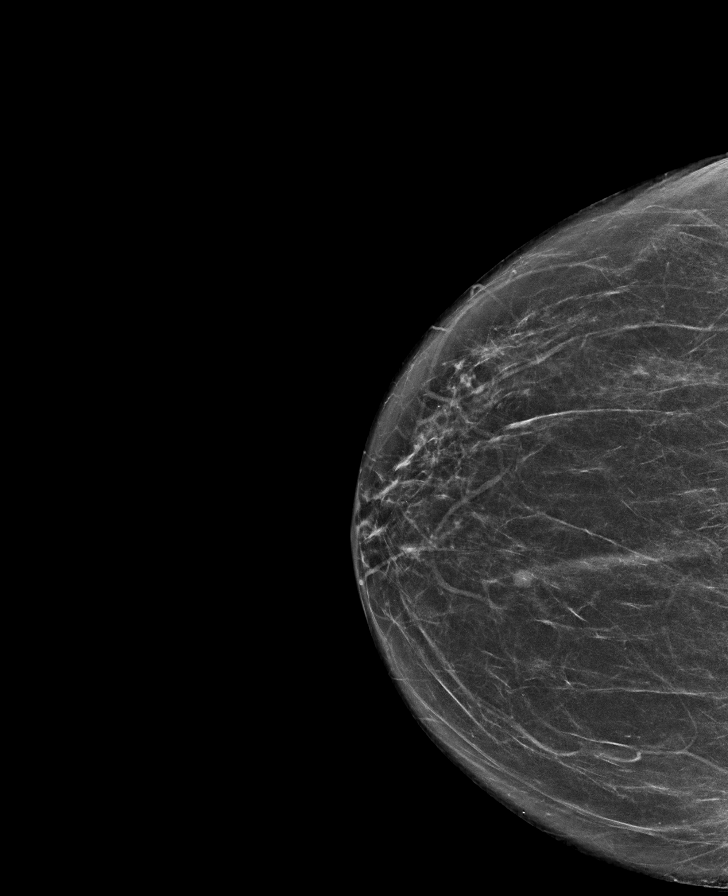

[L MLO synth-2D (1 of 2)]
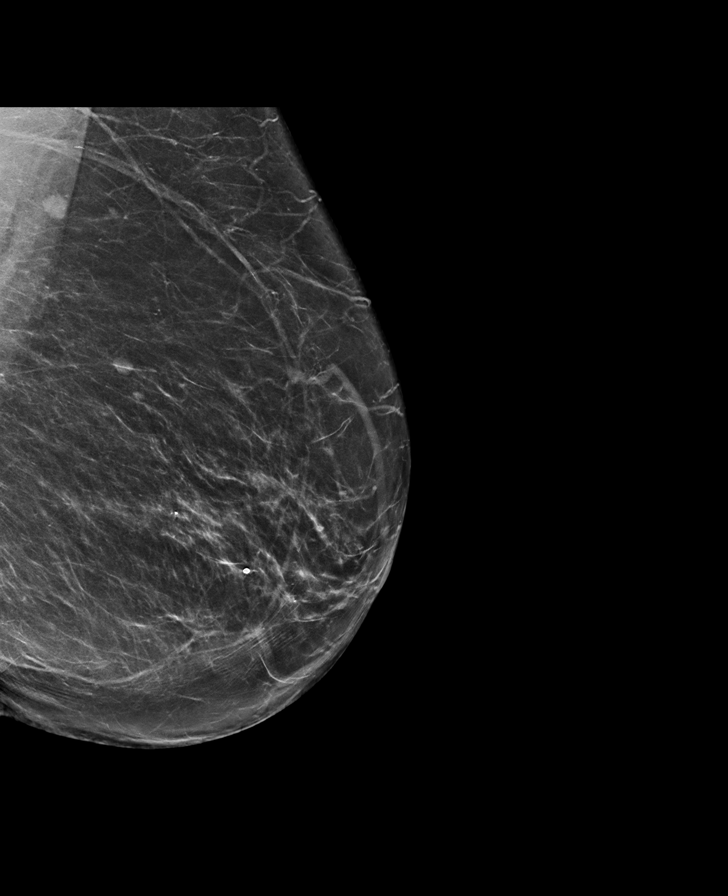

[L CC synth-2D]
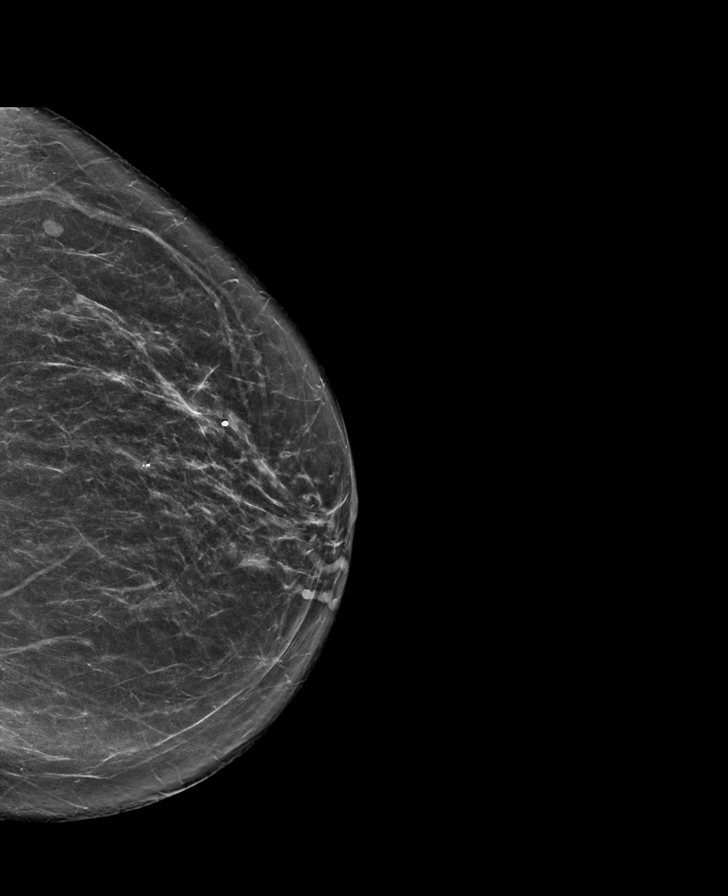

[L MLO synth-2D (2 of 2)]
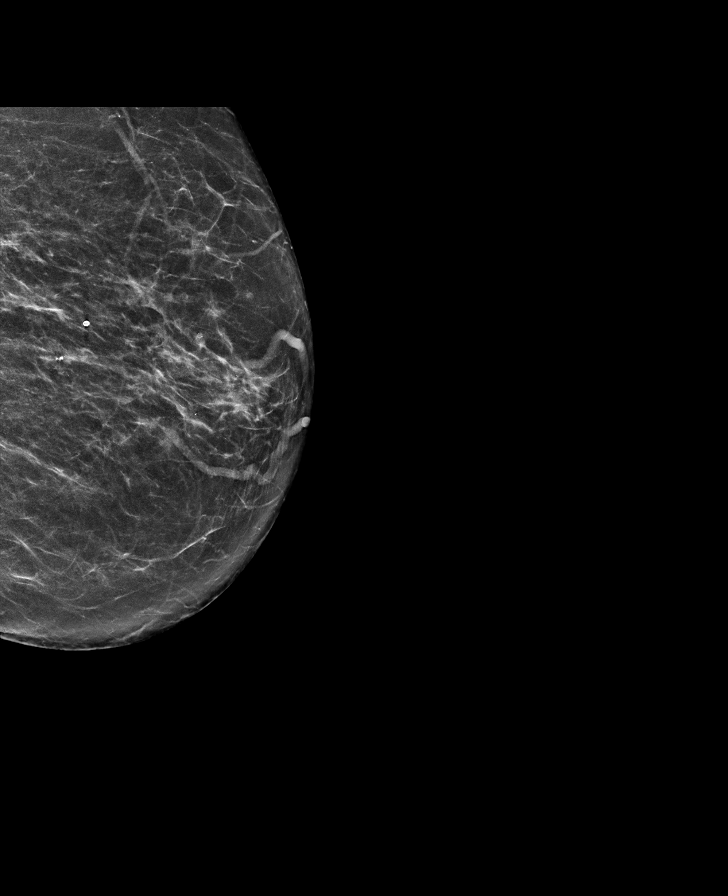

[R MLO tomo · tomo slice 37/74.0]
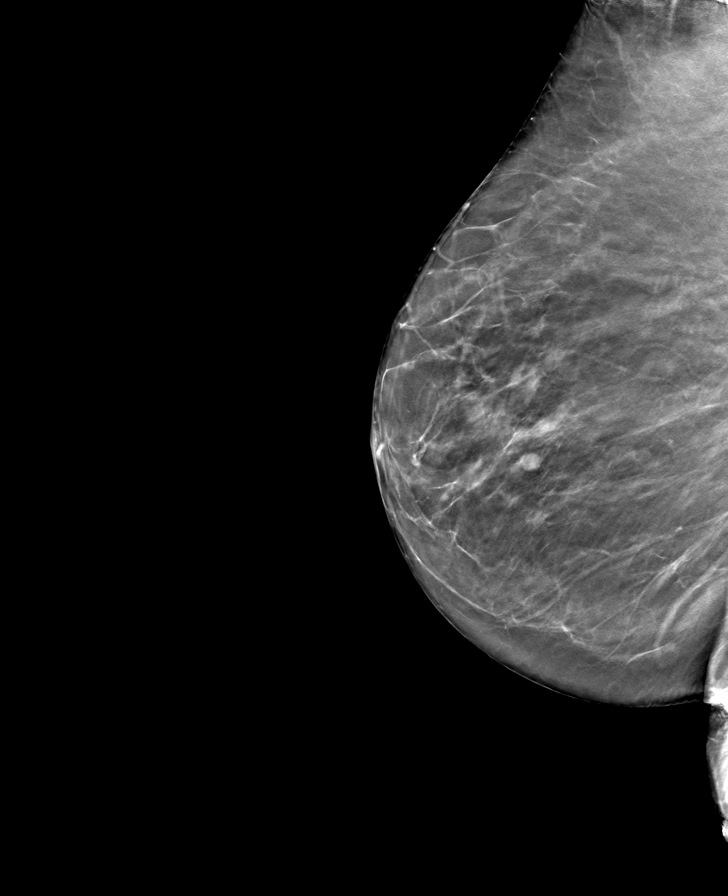

[8 of 40 positions shown; findings below may reference images not displayed]

ACR Breast Density Category b: There are scattered areas of
fibroglandular density.
FINDINGS: There are no findings suspicious for malignancy. Images were
processed with CAD.
IMPRESSION: No mammographic evidence of malignancy. A result letter of this
screening mammogram will be mailed directly to the patient.

RECOMMENDATION:
Screening mammogram in one year. (Code:[id number])

BI-RADS CATEGORY  1: Negative.

## 2020-05-03 DEATH — deceased
